# Patient Record
Sex: Female | Born: 1975 | Race: White | Hispanic: No | Smoking: Former smoker
Health system: Southern US, Community
[De-identification: ages and names within clinical notes are randomized; demographics above are authoritative.]

## PROBLEM LIST (undated history)

## (undated) DIAGNOSIS — E119 Type 2 diabetes mellitus without complications: Secondary | ICD-10-CM

## (undated) DIAGNOSIS — Z9889 Other specified postprocedural states: Secondary | ICD-10-CM

## (undated) DIAGNOSIS — Z8489 Family history of other specified conditions: Secondary | ICD-10-CM

## (undated) DIAGNOSIS — K219 Gastro-esophageal reflux disease without esophagitis: Secondary | ICD-10-CM

## (undated) DIAGNOSIS — G43909 Migraine, unspecified, not intractable, without status migrainosus: Secondary | ICD-10-CM

## (undated) DIAGNOSIS — F419 Anxiety disorder, unspecified: Secondary | ICD-10-CM

## (undated) DIAGNOSIS — IMO0002 Reserved for concepts with insufficient information to code with codable children: Secondary | ICD-10-CM

## (undated) DIAGNOSIS — A4101 Sepsis due to Methicillin susceptible Staphylococcus aureus: Secondary | ICD-10-CM

## (undated) DIAGNOSIS — I499 Cardiac arrhythmia, unspecified: Secondary | ICD-10-CM

## (undated) DIAGNOSIS — R112 Nausea with vomiting, unspecified: Secondary | ICD-10-CM

## (undated) HISTORY — PX: BREAST SURGERY: SHX581

## (undated) HISTORY — DX: Type 2 diabetes mellitus without complications: E11.9

## (undated) HISTORY — PX: ABDOMINAL HYSTERECTOMY: SHX81

## (undated) HISTORY — DX: Reserved for concepts with insufficient information to code with codable children: IMO0002

## (undated) HISTORY — PX: BACK SURGERY: SHX140

## (undated) HISTORY — DX: Sepsis due to methicillin susceptible Staphylococcus aureus: A41.01

## (undated) HISTORY — PX: CHOLECYSTECTOMY: SHX55

## (undated) HISTORY — DX: Migraine, unspecified, not intractable, without status migrainosus: G43.909

---

## 1997-11-21 ENCOUNTER — Ambulatory Visit (HOSPITAL_COMMUNITY): Admission: RE | Admit: 1997-11-21 | Discharge: 1997-11-21 | Payer: Self-pay | Admitting: Family Medicine

## 2000-10-08 ENCOUNTER — Encounter: Payer: Self-pay | Admitting: Neurosurgery

## 2000-10-12 ENCOUNTER — Ambulatory Visit (HOSPITAL_COMMUNITY): Admission: RE | Admit: 2000-10-12 | Discharge: 2000-10-13 | Payer: Self-pay | Admitting: Neurosurgery

## 2000-10-12 ENCOUNTER — Encounter: Payer: Self-pay | Admitting: Neurosurgery

## 2000-11-02 ENCOUNTER — Ambulatory Visit (HOSPITAL_COMMUNITY): Admission: RE | Admit: 2000-11-02 | Discharge: 2000-11-02 | Payer: Self-pay | Admitting: Neurosurgery

## 2000-11-02 ENCOUNTER — Encounter: Payer: Self-pay | Admitting: Neurosurgery

## 2000-11-18 ENCOUNTER — Encounter: Admission: RE | Admit: 2000-11-18 | Discharge: 2001-01-30 | Payer: Self-pay | Admitting: Anesthesiology

## 2000-11-30 ENCOUNTER — Encounter: Payer: Self-pay | Admitting: *Deleted

## 2000-11-30 ENCOUNTER — Ambulatory Visit (HOSPITAL_COMMUNITY): Admission: RE | Admit: 2000-11-30 | Discharge: 2000-11-30 | Payer: Self-pay | Admitting: *Deleted

## 2000-12-07 ENCOUNTER — Encounter (INDEPENDENT_AMBULATORY_CARE_PROVIDER_SITE_OTHER): Payer: Self-pay | Admitting: *Deleted

## 2000-12-07 ENCOUNTER — Observation Stay (HOSPITAL_COMMUNITY): Admission: RE | Admit: 2000-12-07 | Discharge: 2000-12-08 | Payer: Self-pay | Admitting: *Deleted

## 2001-01-05 ENCOUNTER — Encounter (INDEPENDENT_AMBULATORY_CARE_PROVIDER_SITE_OTHER): Payer: Self-pay | Admitting: Specialist

## 2001-01-05 ENCOUNTER — Ambulatory Visit (HOSPITAL_BASED_OUTPATIENT_CLINIC_OR_DEPARTMENT_OTHER): Admission: RE | Admit: 2001-01-05 | Discharge: 2001-01-06 | Payer: Self-pay | Admitting: Plastic Surgery

## 2001-01-15 ENCOUNTER — Encounter (HOSPITAL_COMMUNITY): Admission: RE | Admit: 2001-01-15 | Discharge: 2001-02-14 | Payer: Self-pay | Admitting: Neurosurgery

## 2001-02-18 ENCOUNTER — Encounter: Admission: RE | Admit: 2001-02-18 | Discharge: 2001-02-18 | Payer: Self-pay | Admitting: Neurosurgery

## 2001-02-18 ENCOUNTER — Encounter: Payer: Self-pay | Admitting: Neurosurgery

## 2001-02-22 ENCOUNTER — Encounter: Admission: RE | Admit: 2001-02-22 | Discharge: 2001-02-22 | Payer: Self-pay | Admitting: Neurosurgery

## 2001-02-22 ENCOUNTER — Encounter: Payer: Self-pay | Admitting: Neurosurgery

## 2002-05-03 ENCOUNTER — Ambulatory Visit (HOSPITAL_COMMUNITY): Admission: RE | Admit: 2002-05-03 | Discharge: 2002-05-03 | Payer: Self-pay | Admitting: Neurosurgery

## 2002-05-03 ENCOUNTER — Encounter: Payer: Self-pay | Admitting: Neurosurgery

## 2002-05-30 ENCOUNTER — Encounter: Admission: RE | Admit: 2002-05-30 | Discharge: 2002-05-30 | Payer: Self-pay | Admitting: Neurosurgery

## 2002-05-30 ENCOUNTER — Encounter: Payer: Self-pay | Admitting: Neurosurgery

## 2002-06-02 HISTORY — PX: REDUCTION MAMMAPLASTY: SUR839

## 2002-06-23 ENCOUNTER — Encounter: Payer: Self-pay | Admitting: Neurosurgery

## 2002-06-23 ENCOUNTER — Inpatient Hospital Stay (HOSPITAL_COMMUNITY): Admission: RE | Admit: 2002-06-23 | Discharge: 2002-06-25 | Payer: Self-pay | Admitting: Neurosurgery

## 2002-08-03 ENCOUNTER — Encounter: Payer: Self-pay | Admitting: Neurosurgery

## 2002-08-03 ENCOUNTER — Encounter: Admission: RE | Admit: 2002-08-03 | Discharge: 2002-08-03 | Payer: Self-pay | Admitting: Neurosurgery

## 2002-08-09 ENCOUNTER — Encounter (HOSPITAL_COMMUNITY): Admission: RE | Admit: 2002-08-09 | Discharge: 2002-09-08 | Payer: Self-pay | Admitting: Neurosurgery

## 2002-10-04 ENCOUNTER — Encounter: Admission: RE | Admit: 2002-10-04 | Discharge: 2002-10-04 | Payer: Self-pay | Admitting: Neurosurgery

## 2002-10-04 ENCOUNTER — Encounter: Payer: Self-pay | Admitting: Neurosurgery

## 2002-11-11 ENCOUNTER — Other Ambulatory Visit: Admission: RE | Admit: 2002-11-11 | Discharge: 2002-11-11 | Payer: Self-pay | Admitting: Obstetrics and Gynecology

## 2003-07-02 ENCOUNTER — Emergency Department (HOSPITAL_COMMUNITY): Admission: EM | Admit: 2003-07-02 | Discharge: 2003-07-02 | Payer: Self-pay | Admitting: Emergency Medicine

## 2004-02-26 ENCOUNTER — Inpatient Hospital Stay (HOSPITAL_COMMUNITY): Admission: RE | Admit: 2004-02-26 | Discharge: 2004-02-28 | Payer: Self-pay | Admitting: General Surgery

## 2009-01-29 ENCOUNTER — Emergency Department (HOSPITAL_COMMUNITY): Admission: EM | Admit: 2009-01-29 | Discharge: 2009-01-29 | Payer: Self-pay | Admitting: Emergency Medicine

## 2010-06-23 ENCOUNTER — Encounter: Payer: Self-pay | Admitting: Surgery

## 2010-08-23 ENCOUNTER — Emergency Department (HOSPITAL_COMMUNITY): Payer: Self-pay

## 2010-08-23 ENCOUNTER — Emergency Department (HOSPITAL_COMMUNITY)
Admission: EM | Admit: 2010-08-23 | Discharge: 2010-08-23 | Disposition: A | Discharge status: Home / Self Care | Payer: No Typology Code available for payment source | Attending: Emergency Medicine | Admitting: Emergency Medicine

## 2010-08-23 DIAGNOSIS — T148XXA Other injury of unspecified body region, initial encounter: Secondary | ICD-10-CM | POA: Insufficient documentation

## 2010-08-23 DIAGNOSIS — S20219A Contusion of unspecified front wall of thorax, initial encounter: Secondary | ICD-10-CM | POA: Insufficient documentation

## 2010-08-23 DIAGNOSIS — R071 Chest pain on breathing: Secondary | ICD-10-CM | POA: Insufficient documentation

## 2010-10-18 NOTE — H&P (Signed)
NAME:  Cheryl Randall, Cheryl Randall               ACCOUNT NO.:  [id number]   MEDICAL RECORD NO.:  [id number]          PATIENT TYPE:  AMB   LOCATION:  DAY                           FACILITY:  [facility]   PHYSICIAN:  Tilda Burrow, M.D. DATE OF BIRTH:  06-14-1975   DATE OF ADMISSION:  02/26/2004  DATE OF DISCHARGE:  LH                                HISTORY & PHYSICAL   ADMITTING DIAGNOSES:  1.  Dysmenorrhea.  2.  Dyspareunia.  3.  Heavy and prolonged periods.   HISTORY OF PRESENT ILLNESS:  This 36 year old female was admitted at this  time for abdominal hysterectomy and panniculectomy.  She has been seen in  our office since August with evaluation including review of records.  Prior  GYN care was through Dr. Tresa Res of [place].  Records have been reviewed.  The patient complains of severe dysmenorrhea first two days of period,  bedridden.  She bleeds so heavy, she stays home and misses work.  This has  been occurring for four to five years.  She wants no more children.  She has  had discussion of the pros and cons of hysterectomy by Dr. Tresa Res as well as  myself.  We have discussed vaginal hysterectomy versus abdominal  hysterectomy.  The patient is apprehensive regarding vaginal approach due to  the concern that it might need to be converted. She has had a significantly  difficult back pain and has had rods placed in her back.  She does not want  to run the risk of worsening her back pain with manipulation of positioning  of her legs while she is asleep.   Vaginal probe ultrasound has been performed showing uterine size to be 9.9  cm in length with 4.1 cm AP depth by 5.8 cm transverse size with a thin,  endometrial stripe.  She has an extremely-long cervix which makes 5 cm of  the uterine length, and takes up the upper one-third of the vagina.  This  causes significant discomfort when contacted with the vaginal probe and  reproduces dyspareunia.  After discussion of the anatomic considerations,  we  have agreed on abdominal hysterectomy.  Additionally, the patient has  frustrations with abdominal wall girth.  She has not been successful at  weight loss.  Plans are to do a partial panniculectomy as a part of the  procedure. We have discussed that the risk of surgery is increased with the  increased incision size, specifically risk for infection or fluid pocket  opening, (seroma).   PAST MEDICAL HISTORY:  1.  Benign surgical history.  2.  Back surgery x2.  3.  Breast reduction surgery from four pounds each side in [facility].   INJURIES:  None.   ALLERGIES:  None.   MEDICATIONS:  None.   The back surgery consists of rod and four screws.  Nonetheless, she  complains of back pain at her menses.  The back surgery did help her leg  pain.   PHYSICAL EXAMINATION:  GENERAL:  Reveals a healthy, well-informed,  articulate, Caucasian female, alert and oriented x3.  VITAL SIGNS:  Weight 221  pounds, blood pressure 112/66.  Pulse 70.  HEENT:  Pupils equal, round, reactive.  Extraocular movements intact.  NECK:  Supple.  CARDIOVASCULAR EXAM:  Unremarkable.  ABDOMEN:  Moderate obesity.  No abdominal scars.  EXTERNAL GENITALIA:  Multiparous, good vaginal length.  Cervix bulbous  filling the upper one-third of the vagina.  The uterus measurements noted in  HPI.  Adnexa is without masses at this time on ultrasound and bimanual.  Uterine contact reproduces dyspareunia.   PLAN:  Abdominal hysterectomy, removal of cervix, preservation of ovaries  through long Pfannenstiel incision on February 26, 2004, at 9 a.m.     [name]   JVF/MEDQ  D:  02/25/2004  T:  02/26/2004  Job:  [id number]   cc:   [facility]   [name]  Fax: [phone]

## 2010-10-18 NOTE — Op Note (Signed)
[place]. [facility]  Patient:    Cheryl Randall, Cheryl Randall                        MRN: [id number] Proc. Date: 01/05/01 Adm. Date:  [id number] Attending:  Eloise Levels                           Operative Report  PREOPERATIVE DIAGNOSIS:  Bilateral macromastia.  POSTOPERATIVE DIAGNOSIS:  Bilateral macromastia.  PROCEDURE:  Bilateral reduction mammoplasty.  SURGEON:  Mary A. Contogiannis, M.D.  ASSISTANT:  Stanford Scotland, C.F.A.  ANESTHESIA:  General endotracheal anesthesia.  ANESTHESIOLOGIST:  Cliffton Asters. Crews, M.D.  ESTIMATED BLOOD LOSS:  150 cc.  FLUID REPLACEMENT:  1500 cc. crystalloid.  URINE OUTPUT:  250 cc.  COMPLICATIONS:  None.  AMOUNT OF BREAST TISSUE REMOVED:  Right breast 1040 grams, left breast 872 grams.  INDICATION FOR OVERNIGHT STAY:  Progressive pain control along with ambulation and monitoring of the nipples and breast flaps.  INDICATIONS FOR THE PROCEDURE:  The patient is a 35 year old Caucasian female with bilateral macromastia, neck aches, back aches, intertriginous skin rashes, and shoulder strap groove marks and pain who presents for bilateral reduction mammoplasties.  PROCEDURE:  The patient was brought to the preoperative holding area for the future reduction mammoplasties in the pattern of [place].  She was then taken back to the operating room and placed on the operating table in the supine position.  After adequate general endotracheal anesthesia was obtained, the patients chest was prepped with Betadine and alcohol and draped in a sterile fashion.  The base of each breasts were then injected with 1% lidocaine with epinephrine.  After adequate hemostasis had taken effect, the procedure was begun.  First the right breast reduction was performed.  The nipple was marked with a 42 mm nipple marker and incised.  The skin was then de epithelialized around the breast down to the inframammary crease in an inferior pedicle  pattern. The medial, superior and lateral skin flaps were then elevated down to the chest wall and slightly off the chest wall for shaping.  The excess fat and glandular tissue was then removed from the right breast.  There was a lot of fibrocystic changes present.  The nipple was then examined and found to be pink and viable.  The wound was irrigated with saline irrigation.  The inferior pedicle was centralized using 3-0 chromic sutures.  A number #10 JP flat fully fluted drain was then placed into the wound and then skin flaps brought together in a inverted T junction with a 2-0 Prolene suture.  Incision was stapled for temporary closure.  Attention was then turned to the left breast.  In a similar manner the nipple was marked with a 42 mm nipple marker.  This was then incised, then the skin de epithelialized down to the inframammary crease in an inferior pedicle pattern.  Next, the medial, superior, and lateral skin flaps were elevated down to the chest wall and then slightly off the chest wall for better shaping of the breast.  The excess fat and glandular tissue removed from the inferior pedicle. The nipple was then examined and found to be pink and viable.  The wound was then irrigated with warm saline irrigation.  Meticulous hemostasis was obtained with a Bovie electrocautery.  The inferior pedicle was then centralized using 3-0 Prolene suture.  A #10 JP flap fully fluted drain was then  placed into the wound.  The skin flaps brought together at the inverted T junction with a 2-0 Prolene suture.   The incisions were then stapled for temporary closure.  The breasts were then compared and found to be of good and symmetry and shape.  The incisions were then closed on both breasts using 3-0 monacryl in the dermal layer, and a 4-0 monacryl running intercuticular stitch on the skin.  The patient was then placed in upright position.  The future location of the nipples was marked on each  breast with a 38 mm nipple marker.  She was then placed in recumbent position.  First the skin for the future right nipple areolar complex was incised and excised full thickness using the Bovie electrocautery.  The nipple was then brought out into the aperture and sewn in place using 4-0 monacryl interrupted dermal sutures followed by 5-0 monacryl running intercuticular stitch on the skin.  In a likewise fashion the area for the future nipple areolar complex was incised on the left breast.  This tissue was then excised full thickness with Bovie electrocautery.  The nipple was then brought out into this aperture and sewn in place using 4-0 monacryl interrupted dermal sutures, followed by 5-0 monacryl running intercuticular stitch on the skin.  The drains were sewn in place with 3-0 nylon suture.  The incisions were dressed with benzoin and Steri-Strips.  The nipples were also dressed with bacitracin ointment and Adaptic.  Then 4 x 4 gauze was placed over all these incisions and the nipples and she was placed in a light postoperative support bra.  There were no complications.  The patient tolerated the procedure well.  The final needle and sponge counts were reported to be correct at the end of the case.  Total amount of tissues removed were 1,040 grams right breast and 872 grams left breast.  The patient was then taken to the recovery room in stable condition. She will then remain in the RCC overnight for progressive pain control, ambulation, and monitoring of the breast flaps and nipples. DD:  01/05/01 TD:  01/05/01 Job: [id number]

## 2010-10-18 NOTE — Op Note (Signed)
[place]. [facility]  Patient:    Cheryl Randall, Cheryl Randall                        MRN: [id number] Proc. Date: 12/07/00 Adm. Date:  [id number] Attending:  Kandis Mannan CC:         Colon Flattery, D.O., [place], [facility]  Reinaldo Meeker, M.D.   Operative Report  CCS NUMBER: [id number]  PREOPERATIVE DIAGNOSIS:  Chronic cholecystitis with cholelithiasis.  POSTOPERATIVE DIAGNOSIS:  Chronic cholecystitis with cholelithiasis.  OPERATION: Laparoscopic cholecystectomy.  SURGEON:  Maisie Fus B. Samuella Cota, M.D.  ASSISTANT:   Abigail Miyamoto, M.D.  ANESTHESIA:  General.  ANESTHESIOLOGIST:  Lestine Box, M.D. and CRNA  DESCRIPTION OF PROCEDURE:  The patient was taken to the operating room and placed on the table in supine position.  After satisfactory general anesthetic with intubation, the entire abdomen was prepped and draped in sterile field. A small vertical infraumbilical incision was made through the skin, subcutaneous tissue, and the midline fascia was divided and the peritoneal cavity entered.  A finger was placed in the peritoneal cavity, and there were no adhesions to the anterior abdominal wall.  A pursestring suture of 0 Vicryl was placed, and the Hasson trocar placed in the abdomen and the abdomen insufflated to 14 mmHg pressure.   A second 10 mm trocar was place just to the right of midline in the subxiphoid area.  Two 5 mm trocars were placed laterally.   Visual examination of the abdomen revealed no abnormalities obvious in the pelvis or in the upper abdomen.  The gallbladder had no adhesions to it.  The gallbladder was placed on traction, and the cystic duct was easily identified.  The cystic duct was quite small with the cystic artery being cephalad to that.  The junction of the cystic duct to the gallbladder was easily seen.  Because the patient was known to have a single large gallstone and normal liver function tests, and then with the finding  of a small cystic duct, it was elected not to do an operative cholangiogram.  The cystic duct was triply clipped on the remaining side, once on the gallbladder side, and divided.  The cystic artery was treated in a similar manner. The gallbladder was dissected from the bed using the cautery.  There was another small vessel noted, and this was doubly clipped and divided.  Hemostasis was obtained.  The gallbladder bed was irrigated, and there was no evidence of a bile leak or bleeding.  The gallbladder was freed from the gallbladder bed, and the gallbladder then easily delivered through the infraumbilical incision. There was a single stone about the size of a 1 cm marble.  The pursestring suture in the infraumbilical incision was tied, and this gave good closure to the fascia.   Marcaine 0.25% without epinephrine was injected into the fascia at the infraumbilical incision; 0.25% Marcaine without epinephrine had previously been injected at the other trocar sites.  The fluid in the right upper quadrant was suctioned, and the two lateral trocars were removed under direct vision.  There was no bleeding.  The abdomen was deflated, and the second 10 mm trocar was removed.  The two incisions in the midline were closed with running subcuticular sutures of 4-0 Vicryl, and the two lateral trocar sites were closed with single simple inverted 4-0 Vicryl.   Benzoin and 1/4-inch Steri-Strips were used to reinforce the skin closures.  Dry sterile dressings were applied.  The patient seemed to tolerate the procedure well and was taken to the PACU in satisfactory condition. DD:  12/07/00 TD:  12/07/00 Job: [id number]

## 2010-10-18 NOTE — Op Note (Signed)
[place]. [facility]  Patient:    Cheryl Randall, Cheryl Randall                        MRN: [id number] Proc. Date: 10/12/00 Adm. Date:  [id number] Attending:  Gerald Dexter                           Operative Report  PREOPERATIVE DIAGNOSIS:  Herniated disk, L4-5 right.  POSTOPERATIVE DIAGNOSIS:  Herniated disk, L4-5 right.  PROCEDURES: 1. Right L5 intralaminar laminotomy for excision of herniated disk with    operating microscope. 2. Microdissection L4-5 disk and L5 nerve root.  SURGEON:  Reinaldo Meeker, M.D.  ASSISTANT:  Julio Sicks, M.D.  DESCRIPTION OF PROCEDURE:  After being placed in the prone position, the patients back was prepped and draped in the usual sterile fashion.  A localizing x-ray was taken prior to incision to identify the L4-5 level.  A midline incision was made over the spinous processes of L4 and L5.  Using Bovie cutting current, the incision was carried down to the spinous processes. Subperiosteal dissection was then carried out on the right side of the spinous processes and laminae, and the McCullough self-retaining retractor was placed for exposure.  A second x-ray was taken to confirm approach to the L4-5 level, and this was correct.  Using the high-speed drill, the inferior one-third of the L4 lamina and the medial one-third of the facet joint were removed.  The drill was then used to remove the superior one-third of the L5 lamina. Residual bone and ligamentum flavum were removed in a piecemeal fashion.  The microscope was draped, brought into the field, and used for the remainder of the case.  Using microdissection technique, the lateral aspect of the thecal sac and L5 nerve root were identified.  Further coagulation was carried down to the floor of the canal to identify the L4-5 disk, which was found to be markedly herniated.  After coagulating down the annulus, the annulus was incised with a 15 blade.  Using pituitary rongeurs and  curettes, very thorough disk space cleanout was carried out.  At the same time, great care was taken to avoid injury to the neural elements, and this was successfully done.  At this point, inspection was carried out in all directions for any evidence of residual compression, and none could be identified.  Large amounts of irrigation were carried out and any bleeding controlled with bipolar coagulation and Gelfoam.  The wound was then closed using interrupted Vicryl on the muscle, fascia, subcutaneous and subcuticular tissues, and Dermabond glue and Steri-Strips on the skin.  A sterile dressing was then applied.  The patient was extubated and taken to the recovery room in stable condition. DD:  10/12/00 TD:  10/12/00 Job: [id number]

## 2010-10-18 NOTE — H&P (Signed)
[facility]  Patient:    Cheryl Randall, Cheryl Randall                        MRN: [id number] Adm. Date:  [id number] Attending:  Thyra Breed CC:         Reinaldo Meeker, M.D.   History and Physical  FOLLOWUP EVALUATION  HISTORY OF PRESENT ILLNESS:  Brealyn comes in for follow-up evaluation of her chronic low back pain on the basis of a lumbar radiculopathy.  Since her last evaluation, the patient has noted a 50% reduction in her pain.  She has been started on Elavil by Dr. Gerlene Fee last week and started into physical therapy. She has recently had gallbladder taken out and plans to have a breast reduction.  I advised her that before proceeding with any chronic pain management, she needs to proceed through Dr. Despina Arias planned route and should have the breast reduction surgery to see if this may be contributing to her lower back discomfort.  She rates her back pain as 6/10.  Predominantly radiates out into the right lower extremity and is made worse by walking and improved by lying down.  She is currently on Paxil and amitriptyline.  PHYSICAL EXAMINATION:  VITAL SIGNS:  Blood pressure 129/68, heart rate 108, respiratory rate 20, and O2 saturation 98%.  Pain level 6/10.  NEUROLOGIC:  Straight leg raise signs are negative.  Deep tendon reflexes are symmetric.  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  DISPOSITION: 1. Continue with physical therapy and amitriptyline per Dr. Gerlene Fee. 2. The patient is seeing Dr. Corrie Dandy A. Contogiannis with regard to possible    breast reduction, and I encouraged her to follow up with this.  I advised    her that I would see her if needed in four weeks to discuss chronic pain    management if she is not improved. DD:  12/25/00 TD:  12/25/00 Job: [id number]

## 2010-10-18 NOTE — Procedures (Signed)
[facility]  Patient:    Cheryl Randall, Cheryl Randall                        MRN: [id number] Proc. Date: 11/26/00 Adm. Date:  [id number] Attending:  Thyra Breed CC:         Reinaldo Meeker, M.D.   Procedure Report  PROCEDURE:  Lumbar epidural steroid injection.  DIAGNOSIS:  Lumbar radiculopathy.  INTERVAL HISTORY:  The patient has noted minimal improvement after the first injection.  She is complaining of pain now radiating to the left and right lower extremities.  She feels as though she has an ache in her hip region like she is getting ready to have a spasm.  She contacted Dr. Despina Arias office.  She has been switched from the Tylox to Vicodin, which she takes just in the evenings.  She is very worried about the fact that she has not responded to the first injection and is asking about what options she has, and I advised her that if she does not respond to this injection, that we would have to discuss medical management of her chronic pain as well as potentially having her seen by our psychologist.  She has reluctance to go on long-acting opiates because of the need to care for her child, and she is very worried about her work.  I advised her that Dr. Gerlene Fee would like to see her back in follow-up after this injection and that I would go ahead and schedule her back in four weeks, and we could discuss chronic pain management if she is not improved.  If she is improved, I will give her another injection.  PHYSICAL EXAMINATION:  VITAL SIGNS:  Blood pressure 135/62, heart rate 86, respiratory rate 18, O2 saturation 100%, pain level is 8 out of 10, and temperature is 98 degrees.  MUSCULOSKELETAL/NEUROLOGIC:  Her back shows good healing from previous injection site.  She is very tender to touch over her incisional scar and paraspinous muscles of the back.  Straight leg raise signs are negative, and her deep tendon reflexes are symmetric in the lower  extremities.  DESCRIPTION OF PROCEDURE:  After informed consent was obtained, the patient was placed in a sitting position and monitored.  Her back was prepped with Betadine x 3.  A skin wheal was raised at the L3-4 interspace with 1% lidocaine.  A 20-gauge Tuohy needle was introduced to the lumbar epidural space and loss of resistance to preservative-free normal saline.  There was no CSF nor blood.  The depth was 8 cm.  I injected Medrol 80 mg in 8 ml of preservative-free normal saline.  The needle was flushed and removed intact.  POSTPROCEDURE CONDITION:  Stable.  DISCHARGE INSTRUCTIONS: 1. Resume previous diet. 2. Limitation on activities per instruction sheet. 3. Continue on current medications. 4. Follow up with me in four weeks to discuss pain management if she is not    improved, or sooner if she does respond to this epidural. DD:  11/26/00 TD:  11/26/00 Job: [id number]

## 2010-10-18 NOTE — Op Note (Signed)
NAME:  Cheryl Randall, Cheryl Randall                           ACCOUNT NO.:  [id number]   MEDICAL RECORD NO.:  [id number]                   PATIENT TYPE:  INP   LOCATION:  3172                                 FACILITY:  [facility]   PHYSICIAN:  Reinaldo Meeker, M.D.              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   POSTOPERATIVE DIAGNOSIS:  Herniated disk at L4-5 right, recurrent.   PROCEDURES:  1. Bilateral  L4-5 decompressive laminectomy, followed by bilateral     microdiskectomy, followed by posterior lumbar interbody fusion with bone     spacers and autologous bone graft, followed by trans-facet screws.  2. Microdissection of L4-5 disk and L5 nerve roots bilaterally.  3. Intraoperative electromyographic monitoring.   SURGEON:  Reinaldo Meeker, M.D.   ASSISTANT:  Donalee Citrin, M.D.   DESCRIPTION OF PROCEDURE:  After being placed in the prone position, the  patient's back was prepped and draped in the usual sterile fashion.  Localizing fluoroscopy was used prior to incision to identify the  appropriate level.  A midline incision was made above the spinous processes  of L3, L4, and L5.  Using Bovie cutting current, the incision was carried  down to the spinous processes.  Subperiosteal dissection was then carried  out on the spinous processes and laminae and facet joints bilaterally and a  self-retaining retractor was placed for exposure.  A second fluoroscopy  showed approach to the appropriate level.  Using a Stille rongeur, the  spinous process and interspinous ligament were removed.  Starting on the  patient's left side, a high-speed drill was used to perform a laminotomy by  removing the inferior one-half of the L4 lamina and the medial one-third of  the facet joint, and the superior one-half of the L5 lamina.  Residual bone  and  ligamentum flavum were removed in a piecemeal fashion and the bone was  saved for use at the end of the case.  A similar decompression was carried  out on the patient's right side, once again identifying the edges of the  previous laminotomy and dissecting down.  Residual bone in the midline was  removed as well as scar tissue and ligamentum flavum to complete the  decompressive laminectomy bilaterally.  At this point the microscope was  brought in the field.  Starting on the patient's right side, scar tissue was  dissected away from the nerve root and nerve root mobilization was  progressively obtained.  The disk was then entered and herniated material  removed.  Thorough disk space clean-out was carried out.  At the same time  great care was taken to avoid injury to the neural elements, and this was  successfully done.  A similar diskectomy was then carried out on  the  patient's left side, once again identifying the lateral nerve root, incising  the annulus, and cleaning out the disk vigorously.  At this time inspection  was carried out in all directions for any evidence of residual compression  from disk herniation, and none could be identified.  Posterior lumbar  interbody fusion was then performed.  Under fluoroscopic guidance starting  on the patient's left side, a scraping of the end plate was carried out,  followed by chiseling with a 12 x 9 box cutter.  A 12 x 9 x 20 mm bone  spacer was then placed in the disk space and confirmed in a good position by  fluoroscopic guidance.  A similar procedure was then carried out on the  patient's right side.  Great care was taken to avoid injury to the neural  elements, and this was successfully done.  Scraping, followed by cutting and  placement of the bone was once again carried out.  Prior to placement of the  second bony spacer, autologous bone graft was placed in the midline to help  with the interbody fusion.  At this point trans-facet  screws were placed.  Starting on the patient's left side, a drill hole was placed, followed by  passing a drill over a guidewire.  Intact pedicle was confirmed with  intraoperative EMG monitoring, direct visualization, and AP and lateral  fluoroscopy.  There was some question of a slightly low reading on the EMG,  but AP and lateral fluoroscopy showed the drill and screw to be in good  position, and there was no evidence of breech on palpation of the pedicle.  A 35 mm screw was placed in the appropriate position.  Then on the patient's  right side a similar procedure was carried out.  Once again good screw  placement was confirmed by EMG monitoring, direct visualization, and AP and  lateral fluoroscopy.  Final fluoroscopy in the AP and lateral direction  showed the screw and plug to all be in good position.  Large amounts of  irrigation were carried out.  Any bleeding was controlled with bipolar  coagulation and Gelfoam.  The wound was then closed using interrupted Vicryl  on the muscle, fascia, subcutaneous and subcuticular tissues and staples on  the skin.  A sterile dressing  was then applied.  The patient was extubated  and taken to the recovery room in stable condition.  Prior closing, a  Hemovac drain was left in the epidural space and secured through a separate  stab wound incision to the skin with an interrupted Vicryl stitch.                                               Reinaldo Meeker, M.D.    ROK/MEDQ  D:  06/23/2002  T:  06/23/2002  Job:  [id number]

## 2010-10-18 NOTE — Procedures (Signed)
[facility]  Patient:    Cheryl Randall, Cheryl Randall                        MRN: [id number] Proc. Date: 11/18/00 Adm. Date:  [id number] Disc. Date: [id number] Attending:  Gerald Dexter CC:         Reinaldo Meeker, M.D.   Procedure Report  PROCEDURE:  Lumbar epidural steroid injection.  DIAGNOSIS: Lumbar radiculopathy status post microdiskectomy.  HISTORY OF PRESENT ILLNESS:  Cheryl Randall is a very pleasant 35 year old who was sent to [name] by Dr. Aliene Beams for two lumbar epidural steroid injections. The patient stated that she was in her usual state of health up until December when she had the spontaneous onset of lower back discomfort. At that time, it radiated out into the right lower extremity and was characterized as a dull achy discomfort. She was initially seen by Dr. Hilda Lias who sent her to a chiropractor. She was treated conservatively with Flexeril, vioxx, Darvocet, diazepam and did not improve. A MRI of her back demonstrated a herniated disk and she was sent to Dr. Dorene Sorrow who did not feel like epidurals would be of any benefit and was seen by Dr. Gerlene Fee who initially saw her in late April. She has been through extensive conservative management without improvement and it was felt that the best option at that time was to undergo a right L5 intralaminar laminotomy and microdissection of the L4-5 disk and L5 nerve root. She did well with this procedure but had persistent pain radiating out into the right lower extremity which she described as an intermittently sharp persistent achy discomfort. It is made worse by laying down or bending over and improved by her medication in the form of OxyCodone. She has intermittent numbness and tingling into the right calf region but denied any true weakness or bowel or bladder incontinence.  She is currently on Percocet and Paxil with p.r.n. albuterol.  ALLERGIES:  No known drug allergies.  FAMILY HISTORY:   Positive for melanoma and diabetes.  ACTIVE MEDICAL PROBLEMS:  History of asthma which has been inactive recently, history of panic disorder currently on Paxil and asymptomatic renal cyst.  PAST SURGICAL HISTORY:  Significant for back surgery as mentioned above.  SOCIAL HISTORY:  The patients a nonsmoker, nondrinker. 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 She has not been able to work since April.  REVIEW OF SYSTEMS:  GENERAL:  Negative.  HEAD:  Significant for intermittent frontal headaches which she attributes to stress. EYES:  Significant for corrective lenses. NOSE/MOUTH/THROAT:  Negative. EARS:  Negative. LUNGS: History of asthma, none recently. CARDIOVASCULAR:  Negative. GI: Negative. GU: History of urinary frequency. MUSCULOSKELETAL/NEUROLOGIC:  See HPI. No history of seizure or stroke. HEMATOLOGIC:  Negative. ENDOCRINE: Negative. CUTANEOUS:  Negative. PSYCHIATRIC:  Positive for panic attacks. ALLERGY/IMMUNOLOGIC:  Negative.  PHYSICAL EXAMINATION:  VITAL SIGNS:  Blood pressure is 127/79, heart rate 69, respiratory rate 22, O2 saturations 99%. Pain level was 8/10.  HEENT:  Head was normocephalic, atraumatic. Eyes, extraocular movements intact with conjunctivae and sclerae clear. Nose patent nares without discharge. Oropharynx was free of lesions.  NECK:  Supple without lymphadenopathy.  LUNGS:  Clear to auscultation and percussion.  HEART:  Regular rate and rhythm.  BREASTS/ABDOMINAL/PELVIC/RECTAL:  Not performed.  BACK:  Revealed well healed surgical scar. Straight leg raise signs were negative.  EXTREMITIES:  No cyanosis, clubbing or edema. Radial pulses and dorsalis pedis  pulse 2+ and symmetric.  NEUROLOGIC:  The patient was oriented x 4. Cranial nerves II-XII are grossly intact. Deep tendon reflexes were symmetric in the upper and lower extremity with downgoing toes. Motor was 5/5 with symmetric bulk and tone. Sensation was significant  for attenuated pinprick over the right lower extremity over the calf area to the dorsum of the foot.  A MRI from June 3 revealed at L4-5 a shallow disk protrusion with some enhancement in the posterior spinal soft tissues and right anterior epidural space surrounding the right L5 nerve root. At L5-S1, there was some central annular disk bulge.  IMPRESSION: 1. Persistent lumbar radiculopathy with history of herniated disk at L4-5. 2. Other medical problems per Dr. Dewaine Conger which include panic disorder,    asthma, and asymptomatic renal cyst.  DISPOSITION:  I discussed the potential risks, benefits and limitations of a lumbar epidural steroid injection in detail with the patient as well as with her husband. Their questions were answered. We reviewed the side effects of corticosteroids. The patient is interested in proceeding with this.  DESCRIPTION OF PROCEDURE:  After informed consent was obtained, the patient was placed in the sitting position and monitored. The patients back was prepped with Betadine x 3. A skin wheal was raised at the L3-4 interspace with 1 percent lidocaine. A 20 gauge Tuohy needle was introduced to the lumbar epidural space to loss of resistance to preservative free normal saline. The depth was 8 cm. In the process of placing the needle, at one point, there was drainage of serous fluid which drained completely before receding.  There was no cerebrospinal fluid nor blood. The depth of the serous fluid was approximately 4 cm. The needle was flushed with preservative free normal saline before pursuing into the epidural space at 4 cm. After placing it into the epidural space at 8 cm, I injected 80 mg of Medrol and 8 ml of preservative free normal saline. The needle was flushed with preservative free normal saline and removed intact.  CONDITION POST PROCEDURE:  Stable.  DISCHARGE INSTRUCTIONS:  Resume previous diet. Limitations in activities per instruction sheet.  Continue on current medications. Follow-up with me in 1-2  weeks for repeat injection. DD:  11/18/00 TD:  11/18/00 Job: [id number]

## 2010-10-18 NOTE — Discharge Summary (Signed)
NAME:  Cheryl Randall, Cheryl Randall               ACCOUNT NO.:  [id number]   MEDICAL RECORD NO.:  [id number]          PATIENT TYPE:  INP   LOCATION:  A401                          FACILITY:  [facility]   PHYSICIAN:  Tilda Burrow, M.D. DATE OF BIRTH:  Feb 13, 1976   DATE OF ADMISSION:  02/26/2004  DATE OF DISCHARGE:  09/28/2005LH                                 DISCHARGE SUMMARY   ADMISSION DIAGNOSES:  Dyspareunia with heavy and prolonged periods.   DISCHARGE DIAGNOSES:  1.  Dyspareunia with heavy and prolonged periods.  2.  Obesity with panniculus.   PROCEDURE:  Total abdominal hysterectomy and panniculectomy.   DISCHARGE MEDICATIONS:  Tylox 1 to 2 q. 4 hours p.r.n. pain, Levaquin 500 mg  p.o. q.d. x5 days.   HISTORY OF PRESENT ILLNESS:  This 35 year old female was admitted for  abdominal hysterectomy and panniculectomy. She complained of dysmenorrhea,  missing 2 days of work per week. See HPI for details. The patient requested  panniculectomy as a portion of the procedure, with the patient understanding  that this is a non-covered service.   HOSPITAL COURSE:  The patient underwent abdominal hysterectomy with removal  of the cervix and preservation of ovaries through an exaggerated  Pfannenstiel incision, removing over 1,000 gram of panniculus with  underlying fatty tissue. Uterus was upper limits of normal size. Ovaries  were normal. Appendix was anterior, normal in appearance. There was very  thick abdominal fat noted. Estimated blood loss was 350 cc at surgery.  Postoperatively, the patient did well. She had a hematocrit of 11and  hemoglobin of 11 compared to 11 and 33 preoperatively. White count was  13,100. She had a subcutaneous Jackson-Pratt drain in the panniculectomy  site. On the second postoperative day, February 28, 2004, she was afebrile  and tolerating a regular diet with the passage of gas and no bowel movement  noted (preoperative bowel prep had been performed).   DISPOSITION:   She was stable for discharge. For continued outpatient  management.   FOLLOW UP:  In 1 week for staple removal and earlier for fever,  complications, discomfort, etc.     Cheryl Randall   JVF/MEDQ  D:  04/07/2004  T:  04/08/2004  Job:  [id number]

## 2010-10-18 NOTE — H&P (Signed)
[facility]  Patient:    Cheryl Randall, Cheryl Randall Visit Number: [id number] MRN: [id number]          Service Type: PMG Location: [facility] Attending Physician:  Thyra Breed Dictated by:   Thyra Breed, M.D. Adm. Date:  11/18/2000   CC:         Reinaldo Meeker, M.D.   History and Physical  FOLLOWUP EVALUATION  HISTORY OF PRESENT ILLNESS:  Cheryl Randall comes in for follow-up evaluation of her chronic lumbar radiculopathy to the right lower extremity status post microdiskectomy.  Since her last evaluation, the patient has undergone breast reduction by [facility] A. Contogiannis, M.D. and has done very well with regard to her surgical intervention.  She has a very positive overall sense of well-being following the surgery.  She notes that her right lower extremity discomfort continues to come on her despite the fact she has been put back in physical therapy.  She notes that this has significantly help to reduce some of her discomfort, but she continues to get pain out into the lateral aspect of her right calf which is brought on by activity.  CURRENT MEDICATIONS:  Paxil.  PHYSICAL EXAMINATION:  VITAL SIGNS:  Blood pressure 131/65, heart rate 92, respiratory rate 16, and O2 saturation 96%.  Pain level 8/10.  NEUROLOGIC:  Straight leg raise signs are negative.  Deep tendon reflexes were symmetric at knees and ankles.  IMPRESSION: 1. Lumbar radiculopathy into the right lower extremity, improved after breast    reduction to a degree. 2. Chronic low back pain syndrome with lumbar radiculopathy in L5 distribution    in the right lower extremity.  DISPOSITION: 1. Continue on current medications. 2. Continue with physiotherapy. 3. Followup with me in four weeks, at which time we will consider institution    of either anticonvulsants in the form of Neurontin versus tricyclic    antidepressants, which she is reluctant to go on because of previous    problems with weight gain  versus chronic opiates on a time contingent    basis. Dictated by:   Thyra Breed, M.D. Attending Physician:  Thyra Breed DD:  01/22/01 TD:  01/24/01 Job: [id number]

## 2010-10-18 NOTE — Op Note (Signed)
NAME:  Cheryl Randall, Cheryl Randall               ACCOUNT NO.:  [id number]   MEDICAL RECORD NO.:  [id number]          PATIENT TYPE:  INP   LOCATION:  A401                          FACILITY:  [facility]   PHYSICIAN:  Tilda Burrow, M.D. DATE OF BIRTH:  Dec 13, 1975   DATE OF PROCEDURE:  02/26/2004  DATE OF DISCHARGE:                                 OPERATIVE REPORT   PREOPERATIVE DIAGNOSES:  1.  Dysmenorrhea, dyspareunia, heavy menses.  2.  Obesity with panniculus.   POSTOPERATIVE DIAGNOSES:  1.  Dysmenorrhea, dyspareunia, heavy menses.  2.  Obesity with panniculus.   OPERATION/PROCEDURE:  1.  Total abdominal hysterectomy.  2.  Panniculectomy.   SURGEON:  Tilda Burrow, M.D.   ASSISTANTGeralynn Ochs, C.S.T.-F.A.   ANESTHESIA:  General.   COMPLICATIONS:  None.   ESTIMATED BLOOD LOSS:  350 mL.   FLUIDS REPLACED:  1750 mL crystalloid.   OUTPUT:  250 mL.   FINDINGS:  1.  Thick panniculectomy.  2.  Large cervix.  3.  Upper limits normal size uterus with normal ovaries and tubes.  4.  Anterior appendix.  5.  Thick abdominal fat and plenty of intra-abdominal fat.   DESCRIPTION OF PROCEDURE:  The patient was taken to the operating room and  Foley utilized.  Abdomen prepped and draped.  The patient abdomen had been  marked in the preoperative area as per our prior discussions regarding  panniculectomy.   A 70 cm long ellipse of skin was removed, approximately 10-12 cm in maximum  transverse diameter, removing the ellipse to a depth of about two-thirds of  the way to the fascia.  Point cautery was used as necessary.  Only two  arteries required ligation.  This ellipse of skin was removed and passed off  the table.  The lateral one-third of the incision on each side was pulled  together using 2-0 plain sutures and then the skin stapled shut.  The middle  third was left open and the peritoneal cavity then entered using Pelosi  technique, a midline incision from symphysis pubis halfway to the  umbilicus.   Peritoneal cavity was opened bluntly and no suspicion of trauma associated  with the entry.  Balfour retractor was positioned and abdominal contents  elevated with moistened lap tapes x3.  The bladder flap was then placed  inferiorly.  The uterus could be grasped with multiple tooth Lahey thyroid  tenaculum and elevated.  Round ligaments were taken down on each side by 0  chromic suture ligature and then Bovie cautery.  The utero-ovarian ligament  and fallopian tube pedicle were doubly clamped, cut and 0 chromic suture  ligament applied.  The bladder flap was developed anteriorly.  There was  lots of vasculature in this area.  We took down the uterine vessels on each  side with curved Heaney clamps, Kelly clamps to control backbleeding,  transection and 0 chromic suture ligature.  The cardinal ligaments were then  taken down with straight Heaney clampings, Mayo scissors transection and 0  chromic ligature.  We proceeded down to the level of the cervix.  The cervix  was quite bulbous and filled the upper portion of the vagina. A  stab  incision was made in the anterior cervical vaginal fornix and the cervix  amputated off the vaginal cuff.  Aldridge stitches were placed at each  lateral vaginal angle to maximize support of the cuff and control bleeding  in this area.  We then closed the cuff with continuous running 0 chromic.  There was some oozing from the area between the bladder and the cuff.  This  required two interrupted sutures of 2-0 plain placed transversely in such a  way to avoid the bladder, to achieve adequate hemostasis.  The bladder flap  was reapproximated loosely over the cuff with two interrupted sutures of 2-0  plain.  The pelvis was irrigated.  We then proceeded with removal of  laparotomy equipment and tapes, closed the anterior peritoneum with 2-0  chromic, closed the fascia with continuous running monofilament delayed  absorbable suture, and then placed  subcutaneous JP drains beneath the  panniculus excision on each side.  These were allowed to exit through a  single puncture site in the midline inferior to the incision.  The middle  third of the incision was reapproximated using interrupted 2-0 plain sutures  in the subcutaneous space to obliterate potential space and then staple  closure used throughout the remainder of the skin to result in good skin  edge approximation.  The patient tolerated the procedure well and went to  the recovery room.  Blood loss was estimated at 350 mL.     [name]   JVF/MEDQ  D:  02/27/2004  T:  02/27/2004  Job:  [id number]

## 2011-10-15 ENCOUNTER — Encounter (HOSPITAL_COMMUNITY): Payer: Self-pay

## 2011-10-15 ENCOUNTER — Emergency Department (HOSPITAL_COMMUNITY)
Admission: EM | Admit: 2011-10-15 | Discharge: 2011-10-15 | Disposition: A | Discharge status: Home / Self Care | Payer: Medicaid Other | Attending: Emergency Medicine | Admitting: Emergency Medicine

## 2011-10-15 DIAGNOSIS — IMO0002 Reserved for concepts with insufficient information to code with codable children: Secondary | ICD-10-CM | POA: Insufficient documentation

## 2011-10-15 DIAGNOSIS — M5416 Radiculopathy, lumbar region: Secondary | ICD-10-CM

## 2011-10-15 DIAGNOSIS — M549 Dorsalgia, unspecified: Secondary | ICD-10-CM | POA: Insufficient documentation

## 2011-10-15 MED ORDER — OXYCODONE-ACETAMINOPHEN 5-325 MG PO TABS: 2.0000 | ORAL_TABLET | Freq: Four times a day (QID) | ORAL | Status: AC | PRN

## 2011-10-15 MED ORDER — OXYCODONE-ACETAMINOPHEN 5-325 MG PO TABS
2.0000 | ORAL_TABLET | Freq: Once | ORAL | Status: AC
  Administered 2011-10-15: 2 via ORAL
  Filled 2011-10-15: qty 2

## 2011-10-15 NOTE — Discharge Instructions (Signed)
Lumbosacral Radiculopathy  Take the pain medicine as needed for bad pain or ibuprofen for mild pain. Call Dr. Gerlene Fee or ask your primary care Doctor for referral if not better in a few days Lumbosacral radiculopathy is a pinched nerve or nerves in the low back (lumbosacral area). When this happens you may have weakness in your legs and may not be able to stand on your toes. You may have pain going down into your legs. There may be difficulties with walking normally. There are many causes of this problem. Sometimes this may happen from an injury, or simply from arthritis or boney problems. It may also be caused by other illnesses such as diabetes. If there is no improvement after treatment, further studies may be done to find the exact cause. DIAGNOSIS  X-rays may be needed if the problems become long standing. Electromyograms may be done. This study is one in which the working of nerves and muscles is studied. HOME CARE INSTRUCTIONS   Applications of ice packs may be helpful. Ice can be used in a plastic bag with a towel around it to prevent frostbite to skin. This may be used every 2 hours for 20 to 30 minutes, or as needed, while awake, or as directed by your caregiver.   Only take over-the-counter or prescription medicines for pain, discomfort, or fever as directed by your caregiver.   If physical therapy was prescribed, follow your caregiver's directions.  SEEK IMMEDIATE MEDICAL CARE IF:   You have pain not controlled with medications.   You seem to be getting worse rather than better.   You develop increasing weakness in your legs.   You develop loss of bowel or bladder control.   You have difficulty with walking or balance, or develop clumsiness in the use of your legs.   You have a fever.  MAKE SURE YOU:   Understand these instructions.   Will watch your condition.   Will get help right away if you are not doing well or get worse.  Document Released: 05/19/2005 Document  Revised: 05/08/2011 Document Reviewed: 01/07/2008 [facility] Patient Information 2012 [place], [place].

## 2011-10-15 NOTE — ED Notes (Signed)
Pt complains of back pain, hx of back surgeries, pt sts she can not have an MRI, hx of bulging and ruptured disc.

## 2011-10-15 NOTE — ED Provider Notes (Signed)
History  This chart was scribed for Allena Napoleon, MD by Bennett Scrape. This patient was seen in room STRE5/STRE5 and the patient's care was started at 1:00PM.  CSN: [id number]  Arrival date & time 10/15/11  1233   None     Chief Complaint  Patient presents with  . Back Pain    The history is provided by the patient. No language interpreter was used.    Cheryl Randall is a 36 y.o. female who presents to the Emergency Department complaining of 2 weeks of gradual onset, gradually worsening, constant back pain that radiates shooting pains down her left leg to foot. Pt states that pain became worse while she was working last night after she made a sudden movement. She reports having a h/o two back surgeries. She states that the first surgery was to shave a bulging disc. The second surgery occurred after the disc ruptured where she had a metal plate placed. She states that she made a full recovery. She reports that the pain is similar to the prior bulging disc pain. She reports having tired 6 sessions of epidurals before her first surgery with no improvement in her pain. She reports taking 800 mg of ibuprofen with no improvement in symptoms. She denies fever, loss of bowels or bladder as associated symptoms. She denies smoking and alcohol use. Pain sharp& severe    Pt has a PCP, Dr. Prudy Feeler at [facility].   History reviewed. No pertinent past medical history.  Past Surgical History  Procedure Date  . Back surgery done by Dr. Gerlene Fee  8 or 9 yrs ago  Breast reduction surgery per pt at bedside  History reviewed. No pertinent family history.  History  Substance Use Topics  . Smoking status: Never Smoker   . Smokeless tobacco: Not on file  . Alcohol Use: No    Review of Systems  Constitutional: Negative.   HENT: Negative.   Respiratory: Negative.   Cardiovascular: Negative.   Gastrointestinal: Negative.   Musculoskeletal: Positive for back pain.  Skin: Negative.    Neurological: Negative.   Hematological: Negative.   Psychiatric/Behavioral: Negative.     Allergies  Review of patient's allergies indicates not on file.  Home Medications  No current outpatient prescriptions on file.  Triage Vitals: BP 123/76  Pulse 86  Temp(Src) 97.8 F (36.6 C) (Oral)  Resp 18  SpO2 99%  Physical Exam  Nursing note and vitals reviewed. Constitutional: She is oriented to person, place, and time. She appears well-developed and well-nourished. No distress.  HENT:  Head: Normocephalic and atraumatic.  Eyes: EOM are normal.  Neck: Neck supple. No tracheal deviation present.  Cardiovascular: Normal rate.   Pulmonary/Chest: Effort normal. No respiratory distress.  Abdominal: Soft. There is no tenderness.  Musculoskeletal: Normal range of motion. She exhibits tenderness.       Lumbar tenderness  Neurological: She is alert and oriented to person, place, and time.       DTRs are normal with knee jerk, ankle jerk, biceps, toes going down bilaterally, gait is normal  Skin: Skin is warm and dry.  Psychiatric: She has a normal mood and affect. Her behavior is normal.    ED Course  Procedures (including critical care time)  DIAGNOSTIC STUDIES: Oxygen Saturation is 99% on room air, normal by my interpretation.    COORDINATION OF CARE: 1:09PM-Discussed discharge plan of hydrocodone prescription and advised pt to follow up with her PCP. Pt is not driving home so will give  pt a dose of hydrocodone before discharge.   Labs Reviewed - No data to display No results found.   No diagnosis found.    MDM  Plan prescription Percocet. Followup with Dr. Gerlene Fee or PMD Diagnosis lumbar radiculopathy      I personally performed the services described in this documentation, which was scribed in my presence. The recorded information has been reviewed and considered.   Doug Sou, MD 10/15/11 1318

## 2011-10-15 NOTE — ED Notes (Signed)
Pt reporting lower back pain x 2 weeks. Hx of bulging/ruptured disc, reports "pain feels like same". C/o burning and tingling down left left. Can move all extremities. Pt reporting pain 10/10, pt tearful with movement.

## 2013-02-09 ENCOUNTER — Other Ambulatory Visit (INDEPENDENT_AMBULATORY_CARE_PROVIDER_SITE_OTHER): Payer: Self-pay

## 2013-02-09 ENCOUNTER — Encounter (INDEPENDENT_AMBULATORY_CARE_PROVIDER_SITE_OTHER): Payer: Self-pay

## 2013-02-14 ENCOUNTER — Ambulatory Visit (INDEPENDENT_AMBULATORY_CARE_PROVIDER_SITE_OTHER): Payer: Medicaid Other | Admitting: Surgery

## 2013-02-14 ENCOUNTER — Encounter (INDEPENDENT_AMBULATORY_CARE_PROVIDER_SITE_OTHER): Payer: Self-pay | Admitting: Surgery

## 2013-02-14 VITALS — BP 140/78 | HR 84 | Temp 97.8°F | Resp 20 | Ht 67.0 in | Wt 208.0 lb

## 2013-02-14 DIAGNOSIS — N6019 Diffuse cystic mastopathy of unspecified breast: Secondary | ICD-10-CM

## 2013-02-14 DIAGNOSIS — N6012 Diffuse cystic mastopathy of left breast: Secondary | ICD-10-CM

## 2013-02-14 DIAGNOSIS — N644 Mastodynia: Secondary | ICD-10-CM

## 2013-02-14 NOTE — Patient Instructions (Addendum)
Fibrocystic Breast Changes Fibrocystic breast changes is a non-cancerous(benign) condition that about half of all women have at some time in their life. It is also called benign breast disease and mammary dysplasia. It may also be called fibrocystic breast disease, but it is not really a disease. It is a common condition that occurs when women go through the hormonal changes during their menstrual cycle, between the ages of 73 to 70. Menopausal women do not have this problem, unless they are on hormone therapy. It can affect one or both breasts. This is not a sign that you will later get cancer. CAUSES  Overgrowth of cells lining the milk ducts, or enlarged lobules in the breast, cause the breast duct to become blocked. The duct then fills up with fluid. This is like a small balloon filled with water. It is called a cyst. Over time, with repeated inflammation there is a tendency to form scar tissue. This scar tissue becomes the fibrous part of fibrocystic disease. The exact cause of this happening is not known, but it may be related to the female hormones, estrogen and progesterone. Heredity (genetics) may also be a factor in some cases. SYMPTOMS   Tenderness.  Swelling.  Rope-like feeling.  Lumpy breast, one or both sides.  Changes in the size of the breasts, before and after the menstrual period (larger before, smaller after).  Green or dark brown nipple discharge (not blood). Symptoms are usually worse before periods (menstrual cycle) and get better toward the end of menstruation. Usually, it is temporary minor discomfort. But some women have severe pain.  DIAGNOSIS  Check your breasts monthly. The best time to check your breasts is after your period. If you check them during your period, you are more likely to feel the normal glands enlarged, as a result of the hormonal changes that happen right before your period. If you do not have menstrual periods, check your breasts the first day of every  month. Become familiar with the way your own breasts feel. It is then easier to notice if there are changes, such as more tenderness, a new growth, change in breast size, or a change in a lump that has always been there. All breasts lumps need to be investigated, to rule out breast cancer. See your caregiver as soon as possible, if you find a lump. Most breast lumps are not cancerous. Excellent treatment is available for ones that are.  To make a diagnosis, your caregiver will examine your breasts and may recommend other tests, such as:  Mammogram (breast X-ray).  Ultrasound.  MRI (magnetic resonance imaging).  Removing fluid from the cyst with a fine needle, under local anesthesia (aspiration).  Taking a breast tissue sample (breast biopsy). Some questions your caregiver will ask are:  What was the date of your last period?  When did the lump show up?  Is there any discharge from your breast?  Is the breast tender or painful?  Are the symptoms in one or both breasts?  Has the lump changed in size from month-to-month? How long has it been present?  Any family history of breast problems?  Any past breast problems?  Any history of breast surgery?  Are you taking any medications?  When was your last mammogram, and where was it done? TREATMENT   Dietary changes help to prevent or reduce the symptoms of fibrocystic breast changes.  You may need to stop consuming all foods that contain caffeine, such as chocolate, sodas, coffee, and tea.  Reducing sugar and fat in your diet may also help.  Decrease estrogen in your diet. Some sources include commercially raised meats which contain estrogen. Eliminate other natural estrogens.  Birth control pills can also make symptoms worse.  Natural progesterone cream, applied at a dose of 15 to 20 milligrams per day, from ovulation until a day or two before your period returns, may help with returning to normal breast tissue over several  months. Seek advice from your caregiver.  Over-the-counter pain pills may help, as recommended by your caregiver.  Danazol hormone (female-like hormone) is sometimes used. It may cause hair growth and acne.  Needle aspiration can be used, to remove fluid from the cyst.  Surgery may be needed, to remove a large, persistent, and tender cyst.  Evening primrose oil may help with the tenderness and pain. It has linolenic acid that women may not have enough of. HOME CARE INSTRUCTIONS   Examine your breasts after every menstrual period.  If you do not have menstrual periods, examine your breasts the first day of every month.  Wear a firm support bra, especially when exercising.  Decrease or avoid caffeine in your diet.  Decrease the fat and sugar in your diet.  Eat a balanced diet.  Try to see your caregiver after you have a menstrual period.  Before seeing your caregiver, make notes about:  When you have the symptoms.  What types of symptoms you are having.  Medications you are taking.  When and where your last mammogram was taken.  Past breast problems or breast surgery. SEEK MEDICAL CARE IF:   You have been diagnosed with fibrocystic breast changes, and you develop changes in your breast:  Discharge from the nipple, especially bloody discharge.  Pain in the breast that does not go away after your menstrual period.  New lumps or bumps in the breast.  Lumps in your armpit.  Your breast or breasts become enlarged, red, and painful.  You find an isolated lump, even if it is not tender.  You have questions about this condition that have not been answered. Document Released: 03/05/2006 Document Revised: 08/11/2011 Document Reviewed: 05/30/2009 [facility] Patient Information 2014 [place], [place].

## 2013-02-14 NOTE — Progress Notes (Signed)
Patient ID: Cheryl Randall, female   DOB: 02/13/1976, 37 y.o.   MRN: [id number]  Chief Complaint  Patient presents with  . New Evaluation    eval breast cyst    HPI Cheryl Randall is a 37 y.o. female.  Patient returns for followup of fibrocystic breast disease. She was seen in 2011 do to painful areas in both breasts. This is felt to be fibrocystic in nature. She developed more pain now in her left breast upper outer quadrant with increased nodularity. The right breast is tender as well right upper outer quadrant with nodularity. She has not had a baseline screening mammogram yet. Her left breast and hurting more over last month. No redness or drainage. The pain is sharp in nature and radiates toward the nipple. This is bilateral left greater than right history of breast reduction 2002 HPI  Past Medical History  Diagnosis Date  . DDD (degenerative disc disease)   . Migraine   . Diabetes mellitus without complication     Past Surgical History  Procedure Laterality Date  . Back surgery    . Breast surgery      reduction  . Cholecystectomy      Family History  Problem Relation Age of Onset  . Cancer Father     bladder    Social History History  Substance Use Topics  . Smoking status: Former Games developer  . Smokeless tobacco: Never Used  . Alcohol Use: No    No Known Allergies  Current Outpatient Prescriptions  Medication Sig Dispense Refill  . ibuprofen (ADVIL,MOTRIN) 800 MG tablet Take 800 mg by mouth every 8 (eight) hours as needed. As needed for pharmacy.       No current facility-administered medications for this visit.    Review of Systems Review of Systems  Constitutional: Negative for fever, chills and unexpected weight change.  HENT: Negative for hearing loss, congestion, sore throat, trouble swallowing and voice change.   Eyes: Negative for visual disturbance.  Respiratory: Negative for cough and wheezing.   Cardiovascular: Negative for chest pain, palpitations  and leg swelling.  Gastrointestinal: Negative for nausea, vomiting, abdominal pain, diarrhea, constipation, blood in stool, abdominal distention and anal bleeding.  Genitourinary: Negative for hematuria, vaginal bleeding and difficulty urinating.  Musculoskeletal: Negative for arthralgias.  Skin: Negative for rash and wound.  Neurological: Negative for seizures, syncope and headaches.  Hematological: Negative for adenopathy. Does not bruise/bleed easily.  Psychiatric/Behavioral: Negative for confusion.    Blood pressure 140/78, pulse 84, temperature 97.8 F (36.6 C), temperature source Oral, resp. rate 20, height 5\' 7"  (1.702 m), weight 208 lb (94.348 kg).  Physical Exam Physical Exam  Constitutional: She is oriented to person, place, and time. She appears well-developed and well-nourished.  HENT:  Head: Normocephalic and atraumatic.  Eyes: Pupils are equal, round, and reactive to light. No scleral icterus.  Neck: Normal range of motion. Neck supple.  Pulmonary/Chest: Right breast exhibits tenderness. Left breast exhibits tenderness.    Musculoskeletal: Normal range of motion.  Neurological: She is alert and oriented to person, place, and time.  Skin: Skin is warm.  Psychiatric: She has a normal mood and affect. Her behavior is normal. Judgment and thought content normal.    Data Reviewed none  Assessment    Fibrocystic breast disease with mastalgia bilateral and family history of breast cancer    Plan    Patient needs baseline mammogram after age. Unfortunately no surgical intervention is successful with fibrocystic breast disease. Discussed medical  management of this disease process. We discussed the pathophysiology and natural progression of disease. Discussed medications which could make it better and side effects of these medications. She has no interest at this point in medical management. Recommend decreasing caffeine intake, ibuprofen for pain, vitamin E supplementation  and Primrose oil as well.        Ravon Mortellaro A. 02/14/2013, 10:35 AM

## 2013-03-03 ENCOUNTER — Ambulatory Visit
Admission: RE | Admit: 2013-03-03 | Discharge: 2013-03-03 | Disposition: A | Discharge status: Home / Self Care | Payer: Medicaid Other | Source: Ambulatory Visit | Attending: Surgery | Admitting: Surgery

## 2013-03-03 ENCOUNTER — Other Ambulatory Visit (INDEPENDENT_AMBULATORY_CARE_PROVIDER_SITE_OTHER): Payer: Self-pay | Admitting: Surgery

## 2013-03-03 DIAGNOSIS — N63 Unspecified lump in unspecified breast: Secondary | ICD-10-CM

## 2013-03-03 DIAGNOSIS — N644 Mastodynia: Secondary | ICD-10-CM

## 2014-04-07 ENCOUNTER — Ambulatory Visit (HOSPITAL_COMMUNITY)
Admission: RE | Admit: 2014-04-07 | Discharge: 2014-04-07 | Disposition: A | Discharge status: Home / Self Care | Payer: Disability Insurance | Source: Ambulatory Visit | Attending: Family Medicine | Admitting: Family Medicine

## 2014-04-07 ENCOUNTER — Other Ambulatory Visit (HOSPITAL_COMMUNITY): Payer: Self-pay | Admitting: Family Medicine

## 2014-04-07 DIAGNOSIS — M545 Low back pain, unspecified: Secondary | ICD-10-CM

## 2014-04-07 DIAGNOSIS — Y793 Surgical instruments, materials and orthopedic devices (including sutures) associated with adverse incidents: Secondary | ICD-10-CM | POA: Diagnosis not present

## 2014-04-07 DIAGNOSIS — T84216A Breakdown (mechanical) of internal fixation device of vertebrae, initial encounter: Secondary | ICD-10-CM | POA: Insufficient documentation

## 2015-04-11 ENCOUNTER — Other Ambulatory Visit: Payer: Self-pay | Admitting: Neurosurgery

## 2015-04-11 DIAGNOSIS — M5416 Radiculopathy, lumbar region: Secondary | ICD-10-CM

## 2015-04-13 ENCOUNTER — Ambulatory Visit
Admission: RE | Admit: 2015-04-13 | Discharge: 2015-04-13 | Disposition: A | Discharge status: Home / Self Care | Payer: Managed Care, Other (non HMO) | Source: Ambulatory Visit | Attending: Neurosurgery | Admitting: Neurosurgery

## 2015-04-13 DIAGNOSIS — M5416 Radiculopathy, lumbar region: Secondary | ICD-10-CM

## 2015-04-13 MED ORDER — HYDROXYZINE HCL 50 MG/ML IM SOLN
25.0000 mg | Freq: Once | INTRAMUSCULAR | Status: AC
  Administered 2015-04-13: 25 mg via INTRAMUSCULAR

## 2015-04-13 MED ORDER — MEPERIDINE HCL 100 MG/ML IJ SOLN
100.0000 mg | Freq: Once | INTRAMUSCULAR | Status: AC
  Administered 2015-04-13: 100 mg via INTRAMUSCULAR

## 2015-04-13 MED ORDER — DIAZEPAM 5 MG PO TABS
10.0000 mg | ORAL_TABLET | Freq: Once | ORAL | Status: AC
  Administered 2015-04-13: 10 mg via ORAL

## 2015-04-13 MED ORDER — IOHEXOL 180 MG/ML  SOLN
17.0000 mL | Freq: Once | INTRAMUSCULAR | Status: DC | PRN
  Administered 2015-04-13: 17 mL via INTRATHECAL

## 2015-04-13 NOTE — Discharge Instructions (Signed)

## 2015-04-18 ENCOUNTER — Other Ambulatory Visit (HOSPITAL_COMMUNITY): Payer: Self-pay | Admitting: Neurosurgery

## 2015-05-22 NOTE — Pre-Procedure Instructions (Signed)
Cheryl Randall  05/22/2015     Your procedure is scheduled on : Thursday May 31, 2015 at 7:30 AM.  Report to [facility] Admitting at 5:30 AM.  Call this number if you have problems the morning of surgery: [phone]    Remember:  Do not eat food or drink liquids after midnight.  Take these medicines the morning of surgery with A SIP OF WATER : Acetaminophen (Tylenol) if needed, Sertraline (Zoloft)   Stop taking any vitamins, herbal medications, Ibuprofen, Advil, Motrin, Aleve etc on Thursday December 22nd   Do not wear jewelry, make-up or nail polish.  Do not wear lotions, powders, or perfumes.    Do not shave 48 hours prior to surgery.    Do not bring valuables to the hospital.  [facility] is not responsible for any belongings or valuables.  Contacts, dentures or bridgework may not be worn into surgery.  Leave your suitcase in the car.  After surgery it may be brought to your room.  For patients admitted to the hospital, discharge time will be determined by your treatment team.  Patients discharged the day of surgery will not be allowed to drive home.   Name and phone number of your driver:    Special instructions:  Shower using CHG soap the night before and the morning of your surgery  Please read over the following fact sheets that you were given. Pain Booklet, Coughing and Deep Breathing, MRSA Information and Surgical Site Infection Prevention

## 2015-05-23 ENCOUNTER — Encounter (HOSPITAL_COMMUNITY): Payer: Self-pay

## 2015-05-23 ENCOUNTER — Encounter (HOSPITAL_COMMUNITY)
Admission: RE | Admit: 2015-05-23 | Discharge: 2015-05-23 | Disposition: A | Discharge status: Home / Self Care | Payer: 59 | Source: Ambulatory Visit | Attending: Neurosurgery | Admitting: Neurosurgery

## 2015-05-23 DIAGNOSIS — Z0183 Encounter for blood typing: Secondary | ICD-10-CM | POA: Insufficient documentation

## 2015-05-23 DIAGNOSIS — Z01812 Encounter for preprocedural laboratory examination: Secondary | ICD-10-CM | POA: Insufficient documentation

## 2015-05-23 DIAGNOSIS — M4800 Spinal stenosis, site unspecified: Secondary | ICD-10-CM | POA: Diagnosis not present

## 2015-05-23 HISTORY — DX: Family history of other specified conditions: Z84.89

## 2015-05-23 HISTORY — DX: Anxiety disorder, unspecified: F41.9

## 2015-05-23 HISTORY — DX: Other specified postprocedural states: Z98.890

## 2015-05-23 HISTORY — DX: Nausea with vomiting, unspecified: R11.2

## 2015-05-23 LAB — TYPE AND SCREEN
ABO/RH(D): O POS
Antibody Screen: NEGATIVE

## 2015-05-23 LAB — CBC
HEMATOCRIT: 41.4 % (ref 36.0–46.0)
HEMOGLOBIN: 13.3 g/dL (ref 12.0–15.0)
MCH: 28.4 pg (ref 26.0–34.0)
MCHC: 32.1 g/dL (ref 30.0–36.0)
MCV: 88.5 fL (ref 78.0–100.0)
Platelets: 203 10*3/uL (ref 150–400)
RBC: 4.68 MIL/uL (ref 3.87–5.11)
RDW: 13.2 % (ref 11.5–15.5)
WBC: 6.4 10*3/uL (ref 4.0–10.5)

## 2015-05-23 LAB — SURGICAL PCR SCREEN
MRSA, PCR: NEGATIVE
STAPHYLOCOCCUS AUREUS: NEGATIVE

## 2015-05-23 LAB — BASIC METABOLIC PANEL
Anion gap: 7 (ref 5–15)
BUN: 15 mg/dL (ref 6–20)
CHLORIDE: 107 mmol/L (ref 101–111)
CO2: 26 mmol/L (ref 22–32)
CREATININE: 0.65 mg/dL (ref 0.44–1.00)
Calcium: 9.2 mg/dL (ref 8.9–10.3)
GFR calc non Af Amer: >60 mL/min (ref >60)
Glucose, Bld: 101 mg/dL — ABNORMAL HIGH (ref 65–99)
POTASSIUM: 4.5 mmol/L (ref 3.5–5.1)
SODIUM: 140 mmol/L (ref 135–145)

## 2015-05-23 LAB — ABO/RH: ABO/RH(D): O POS

## 2015-05-23 NOTE — Progress Notes (Signed)
PCP is [name], PA-C  Patient denied having any cardiac or pulmonary issues  Nurse inquired about patient having diabetes and patient denied having diabetes. Patient stated "it runs in my family, but I am not diabetic. I was a lot heavier and I was told I was borderline, but I'm not diabetic." Patient denied having to check her blood glucose at home, and patient was unaware of what a A1C was.

## 2015-05-24 LAB — HEMOGLOBIN A1C
HEMOGLOBIN A1C: 5.8 % — AB (ref 4.8–5.6)
MEAN PLASMA GLUCOSE: 120 mg/dL

## 2015-05-30 ENCOUNTER — Other Ambulatory Visit (HOSPITAL_COMMUNITY): Payer: Self-pay | Admitting: Neurosurgery

## 2015-05-30 MED ORDER — CEFAZOLIN SODIUM-DEXTROSE 2-3 GM-% IV SOLR
2.0000 g | INTRAVENOUS | Status: AC
  Administered 2015-05-31: 2 g via INTRAVENOUS
  Filled 2015-05-30: qty 50

## 2015-05-30 MED ORDER — DEXAMETHASONE SODIUM PHOSPHATE 10 MG/ML IJ SOLN
10.0000 mg | INTRAMUSCULAR | Status: DC
  Filled 2015-05-30: qty 1

## 2015-05-31 ENCOUNTER — Inpatient Hospital Stay (HOSPITAL_COMMUNITY)
Admission: RE | Admit: 2015-05-31 | Discharge: 2015-06-02 | DRG: 460 | Disposition: A | Discharge status: Home / Self Care | Payer: 59 | Source: Ambulatory Visit | Attending: Neurosurgery | Admitting: Neurosurgery

## 2015-05-31 ENCOUNTER — Inpatient Hospital Stay (HOSPITAL_COMMUNITY): Payer: 59

## 2015-05-31 ENCOUNTER — Inpatient Hospital Stay (HOSPITAL_COMMUNITY): Payer: 59 | Admitting: Anesthesiology

## 2015-05-31 ENCOUNTER — Encounter (HOSPITAL_COMMUNITY)
Admission: RE | Disposition: A | Discharge status: Home / Self Care | Payer: Self-pay | Source: Ambulatory Visit | Attending: Neurosurgery

## 2015-05-31 ENCOUNTER — Encounter (HOSPITAL_COMMUNITY): Payer: Self-pay | Admitting: *Deleted

## 2015-05-31 DIAGNOSIS — Y838 Other surgical procedures as the cause of abnormal reaction of the patient, or of later complication, without mention of misadventure at the time of the procedure: Secondary | ICD-10-CM | POA: Diagnosis present

## 2015-05-31 DIAGNOSIS — E119 Type 2 diabetes mellitus without complications: Secondary | ICD-10-CM | POA: Diagnosis present

## 2015-05-31 DIAGNOSIS — Y793 Surgical instruments, materials and orthopedic devices (including sutures) associated with adverse incidents: Secondary | ICD-10-CM | POA: Diagnosis present

## 2015-05-31 DIAGNOSIS — M960 Pseudarthrosis after fusion or arthrodesis: Secondary | ICD-10-CM | POA: Diagnosis present

## 2015-05-31 DIAGNOSIS — G43909 Migraine, unspecified, not intractable, without status migrainosus: Secondary | ICD-10-CM | POA: Diagnosis present

## 2015-05-31 DIAGNOSIS — M79605 Pain in left leg: Secondary | ICD-10-CM | POA: Diagnosis present

## 2015-05-31 DIAGNOSIS — M549 Dorsalgia, unspecified: Secondary | ICD-10-CM

## 2015-05-31 DIAGNOSIS — Z87891 Personal history of nicotine dependence: Secondary | ICD-10-CM | POA: Diagnosis not present

## 2015-05-31 DIAGNOSIS — F419 Anxiety disorder, unspecified: Secondary | ICD-10-CM | POA: Diagnosis present

## 2015-05-31 DIAGNOSIS — S32009K Unspecified fracture of unspecified lumbar vertebra, subsequent encounter for fracture with nonunion: Secondary | ICD-10-CM | POA: Diagnosis present

## 2015-05-31 HISTORY — DX: Unspecified fracture of unspecified lumbar vertebra, subsequent encounter for fracture with nonunion: S32.009K

## 2015-05-31 LAB — GLUCOSE, CAPILLARY
Glucose-Capillary: 146 mg/dL — ABNORMAL HIGH (ref 65–99)
Glucose-Capillary: 117 mg/dL — ABNORMAL HIGH (ref 65–99)

## 2015-05-31 SURGERY — POSTERIOR LUMBAR FUSION 1 WITH HARDWARE REMOVAL
Anesthesia: General | Site: Back

## 2015-05-31 MED ORDER — THROMBIN 20000 UNITS EX SOLR
CUTANEOUS | Status: DC | PRN
  Administered 2015-05-31: 20 mL via TOPICAL

## 2015-05-31 MED ORDER — NEOSTIGMINE METHYLSULFATE 10 MG/10ML IV SOLN
INTRAVENOUS | Status: AC
  Filled 2015-05-31: qty 2

## 2015-05-31 MED ORDER — NEOSTIGMINE METHYLSULFATE 10 MG/10ML IV SOLN
INTRAVENOUS | Status: DC | PRN
  Administered 2015-05-31: 5 mg via INTRAVENOUS

## 2015-05-31 MED ORDER — ONDANSETRON HCL 4 MG/2ML IJ SOLN
INTRAMUSCULAR | Status: DC | PRN
  Administered 2015-05-31 (×2): 4 mg via INTRAVENOUS

## 2015-05-31 MED ORDER — FENTANYL CITRATE (PF) 250 MCG/5ML IJ SOLN
INTRAMUSCULAR | Status: AC
  Filled 2015-05-31: qty 5

## 2015-05-31 MED ORDER — MIDAZOLAM HCL 2 MG/2ML IJ SOLN
INTRAMUSCULAR | Status: AC
  Filled 2015-05-31: qty 2

## 2015-05-31 MED ORDER — SODIUM CHLORIDE 0.9 % IJ SOLN
INTRAMUSCULAR | Status: AC
  Filled 2015-05-31: qty 10

## 2015-05-31 MED ORDER — VANCOMYCIN HCL 1000 MG IV SOLR
INTRAVENOUS | Status: AC
  Filled 2015-05-31: qty 1000

## 2015-05-31 MED ORDER — SODIUM CHLORIDE 0.9 % IJ SOLN: 3.0000 mL | INTRAMUSCULAR | Status: DC | PRN

## 2015-05-31 MED ORDER — VANCOMYCIN HCL 1000 MG IV SOLR
INTRAVENOUS | Status: DC | PRN
  Administered 2015-05-31: 1000 mg

## 2015-05-31 MED ORDER — LACTATED RINGERS IV SOLN
INTRAVENOUS | Status: DC | PRN
  Administered 2015-05-31 (×3): via INTRAVENOUS

## 2015-05-31 MED ORDER — OXYCODONE-ACETAMINOPHEN 5-325 MG PO TABS
ORAL_TABLET | ORAL | Status: AC
  Filled 2015-05-31: qty 2

## 2015-05-31 MED ORDER — ACETAMINOPHEN 325 MG PO TABS: 650.0000 mg | ORAL_TABLET | ORAL | Status: DC | PRN

## 2015-05-31 MED ORDER — KCL IN DEXTROSE-NACL 20-5-0.45 MEQ/L-%-% IV SOLN: 80.0000 mL/h | INTRAVENOUS | Status: DC

## 2015-05-31 MED ORDER — SUGAMMADEX SODIUM 200 MG/2ML IV SOLN
INTRAVENOUS | Status: AC
  Filled 2015-05-31: qty 2

## 2015-05-31 MED ORDER — ONDANSETRON HCL 4 MG/2ML IJ SOLN: 4.0000 mg | INTRAMUSCULAR | Status: DC | PRN

## 2015-05-31 MED ORDER — GLYCOPYRROLATE 0.2 MG/ML IJ SOLN
INTRAMUSCULAR | Status: AC
  Filled 2015-05-31: qty 4

## 2015-05-31 MED ORDER — ACETAMINOPHEN 650 MG RE SUPP: 650.0000 mg | RECTAL | Status: DC | PRN

## 2015-05-31 MED ORDER — BISACODYL 5 MG PO TBEC: 5.0000 mg | DELAYED_RELEASE_TABLET | Freq: Every day | ORAL | Status: DC | PRN

## 2015-05-31 MED ORDER — PHENYLEPHRINE HCL 10 MG/ML IJ SOLN
10.0000 mg | INTRAVENOUS | Status: DC | PRN
  Administered 2015-05-31: 15 ug/min via INTRAVENOUS

## 2015-05-31 MED ORDER — SUGAMMADEX SODIUM 200 MG/2ML IV SOLN
INTRAVENOUS | Status: DC | PRN
  Administered 2015-05-31: 100 mg via INTRAVENOUS

## 2015-05-31 MED ORDER — GLYCOPYRROLATE 0.2 MG/ML IJ SOLN
INTRAMUSCULAR | Status: DC | PRN
  Administered 2015-05-31: .8 mg via INTRAVENOUS

## 2015-05-31 MED ORDER — PROPOFOL 10 MG/ML IV BOLUS
INTRAVENOUS | Status: AC
  Filled 2015-05-31: qty 40

## 2015-05-31 MED ORDER — CEFAZOLIN SODIUM-DEXTROSE 2-3 GM-% IV SOLR
2.0000 g | Freq: Three times a day (TID) | INTRAVENOUS | Status: AC
  Administered 2015-05-31 (×2): 2 g via INTRAVENOUS
  Filled 2015-05-31 (×2): qty 50

## 2015-05-31 MED ORDER — OXYCODONE-ACETAMINOPHEN 5-325 MG PO TABS
1.0000 | ORAL_TABLET | ORAL | Status: DC | PRN
  Administered 2015-05-31 – 2015-06-01 (×6): 2 via ORAL
  Filled 2015-05-31 (×5): qty 2

## 2015-05-31 MED ORDER — SODIUM CHLORIDE 0.9 % IR SOLN
Status: DC | PRN
  Administered 2015-05-31: 500 mL

## 2015-05-31 MED ORDER — ARTIFICIAL TEARS OP OINT
TOPICAL_OINTMENT | OPHTHALMIC | Status: AC
  Filled 2015-05-31: qty 7

## 2015-05-31 MED ORDER — PROMETHAZINE HCL 25 MG/ML IJ SOLN
INTRAMUSCULAR | Status: AC
  Filled 2015-05-31: qty 1

## 2015-05-31 MED ORDER — HYDROMORPHONE HCL 1 MG/ML IJ SOLN
0.2500 mg | INTRAMUSCULAR | Status: DC | PRN
  Administered 2015-05-31 (×3): 0.5 mg via INTRAVENOUS

## 2015-05-31 MED ORDER — MIDAZOLAM HCL 5 MG/5ML IJ SOLN
INTRAMUSCULAR | Status: DC | PRN
  Administered 2015-05-31: 1 mg via INTRAVENOUS

## 2015-05-31 MED ORDER — MENTHOL 3 MG MT LOZG
1.0000 | LOZENGE | OROMUCOSAL | Status: DC | PRN
  Administered 2015-06-01: 3 mg via ORAL
  Filled 2015-05-31: qty 9

## 2015-05-31 MED ORDER — 0.9 % SODIUM CHLORIDE (POUR BTL) OPTIME
TOPICAL | Status: DC | PRN
  Administered 2015-05-31: 1000 mL

## 2015-05-31 MED ORDER — SCOPOLAMINE 1 MG/3DAYS TD PT72
MEDICATED_PATCH | TRANSDERMAL | Status: AC
  Administered 2015-05-31: 1 via TRANSDERMAL
  Filled 2015-05-31: qty 1

## 2015-05-31 MED ORDER — SERTRALINE HCL 50 MG PO TABS
50.0000 mg | ORAL_TABLET | Freq: Every day | ORAL | Status: DC
  Filled 2015-05-31: qty 1

## 2015-05-31 MED ORDER — DEXAMETHASONE SODIUM PHOSPHATE 4 MG/ML IJ SOLN
INTRAMUSCULAR | Status: AC
  Filled 2015-05-31: qty 2

## 2015-05-31 MED ORDER — PHENOL 1.4 % MT LIQD: 1.0000 | OROMUCOSAL | Status: DC | PRN

## 2015-05-31 MED ORDER — LIDOCAINE HCL (CARDIAC) 20 MG/ML IV SOLN
INTRAVENOUS | Status: DC | PRN
  Administered 2015-05-31: 40 mg via INTRAVENOUS

## 2015-05-31 MED ORDER — SUCCINYLCHOLINE CHLORIDE 20 MG/ML IJ SOLN
INTRAMUSCULAR | Status: AC
  Filled 2015-05-31: qty 1

## 2015-05-31 MED ORDER — PHENYLEPHRINE 40 MCG/ML (10ML) SYRINGE FOR IV PUSH (FOR BLOOD PRESSURE SUPPORT)
PREFILLED_SYRINGE | INTRAVENOUS | Status: AC
  Filled 2015-05-31: qty 20

## 2015-05-31 MED ORDER — EPHEDRINE SULFATE 50 MG/ML IJ SOLN
INTRAMUSCULAR | Status: AC
  Filled 2015-05-31: qty 1

## 2015-05-31 MED ORDER — ARTIFICIAL TEARS OP OINT
TOPICAL_OINTMENT | OPHTHALMIC | Status: DC | PRN
  Administered 2015-05-31: 1 via OPHTHALMIC

## 2015-05-31 MED ORDER — FENTANYL CITRATE (PF) 100 MCG/2ML IJ SOLN
INTRAMUSCULAR | Status: DC | PRN
  Administered 2015-05-31: 100 ug via INTRAVENOUS
  Administered 2015-05-31: 50 ug via INTRAVENOUS
  Administered 2015-05-31: 25 ug via INTRAVENOUS
  Administered 2015-05-31 (×2): 50 ug via INTRAVENOUS

## 2015-05-31 MED ORDER — BUPIVACAINE HCL (PF) 0.5 % IJ SOLN
INTRAMUSCULAR | Status: DC | PRN
  Administered 2015-05-31: 30 mL

## 2015-05-31 MED ORDER — HYDROMORPHONE HCL 1 MG/ML IJ SOLN
INTRAMUSCULAR | Status: AC
  Administered 2015-05-31: 0.5 mg via INTRAVENOUS
  Filled 2015-05-31: qty 1

## 2015-05-31 MED ORDER — DEXAMETHASONE SODIUM PHOSPHATE 4 MG/ML IJ SOLN
INTRAMUSCULAR | Status: DC | PRN
  Administered 2015-05-31: 4 mg via INTRAVENOUS

## 2015-05-31 MED ORDER — ONDANSETRON HCL 4 MG/2ML IJ SOLN
INTRAMUSCULAR | Status: AC
  Filled 2015-05-31: qty 4

## 2015-05-31 MED ORDER — PROPOFOL 10 MG/ML IV BOLUS
INTRAVENOUS | Status: DC | PRN
  Administered 2015-05-31: 200 mg via INTRAVENOUS

## 2015-05-31 MED ORDER — PHENYLEPHRINE HCL 10 MG/ML IJ SOLN
INTRAMUSCULAR | Status: DC | PRN
  Administered 2015-05-31: 40 ug via INTRAVENOUS
  Administered 2015-05-31 (×3): 80 ug via INTRAVENOUS

## 2015-05-31 MED ORDER — ROCURONIUM BROMIDE 50 MG/5ML IV SOLN
INTRAVENOUS | Status: AC
  Filled 2015-05-31: qty 2

## 2015-05-31 MED ORDER — CYCLOBENZAPRINE HCL 10 MG PO TABS
10.0000 mg | ORAL_TABLET | Freq: Three times a day (TID) | ORAL | Status: DC | PRN
  Administered 2015-05-31 – 2015-06-02 (×3): 10 mg via ORAL
  Filled 2015-05-31 (×3): qty 1

## 2015-05-31 MED ORDER — ROCURONIUM BROMIDE 100 MG/10ML IV SOLN
INTRAVENOUS | Status: DC | PRN
  Administered 2015-05-31: 5 mg via INTRAVENOUS
  Administered 2015-05-31: 20 mg via INTRAVENOUS
  Administered 2015-05-31: 50 mg via INTRAVENOUS
  Administered 2015-05-31: 10 mg via INTRAVENOUS

## 2015-05-31 MED ORDER — PROMETHAZINE HCL 25 MG/ML IJ SOLN
6.2500 mg | INTRAMUSCULAR | Status: DC | PRN
  Administered 2015-05-31: 6.25 mg via INTRAVENOUS

## 2015-05-31 MED ORDER — DOCUSATE SODIUM 100 MG PO CAPS
100.0000 mg | ORAL_CAPSULE | Freq: Two times a day (BID) | ORAL | Status: DC
  Administered 2015-05-31 – 2015-06-01 (×3): 100 mg via ORAL
  Filled 2015-05-31 (×3): qty 1

## 2015-05-31 MED ORDER — SODIUM CHLORIDE 0.9 % IJ SOLN
3.0000 mL | Freq: Two times a day (BID) | INTRAMUSCULAR | Status: DC
  Administered 2015-05-31 (×2): 3 mL via INTRAVENOUS

## 2015-05-31 MED ORDER — HYDROMORPHONE HCL 1 MG/ML IJ SOLN: 1.0000 mg | INTRAMUSCULAR | Status: DC | PRN

## 2015-05-31 MED ORDER — LIDOCAINE HCL (CARDIAC) 20 MG/ML IV SOLN
INTRAVENOUS | Status: AC
  Filled 2015-05-31: qty 10

## 2015-05-31 MED ORDER — PANTOPRAZOLE SODIUM 40 MG IV SOLR
40.0000 mg | Freq: Every day | INTRAVENOUS | Status: DC
  Administered 2015-05-31: 40 mg via INTRAVENOUS
  Filled 2015-05-31: qty 40

## 2015-05-31 SURGICAL SUPPLY — 56 items
BAG DECANTER FOR FLEXI CONT (MISCELLANEOUS) ×3 IMPLANT
BENZOIN TINCTURE PRP APPL 2/3 (GAUZE/BANDAGES/DRESSINGS) ×3 IMPLANT
BLADE CLIPPER SURG (BLADE) ×3 IMPLANT
BRUSH SCRUB EZ PLAIN DRY (MISCELLANEOUS) ×3 IMPLANT
BUR CUTTER 7.0 ROUND (BURR) ×6 IMPLANT
BUR MATCHSTICK NEURO 3.0 LAGG (BURR) ×3 IMPLANT
CANISTER SUCT 3000ML PPV (MISCELLANEOUS) ×3 IMPLANT
CLOSURE WOUND 1/2 X4 (GAUZE/BANDAGES/DRESSINGS) ×2
CONT SPEC 4OZ CLIKSEAL STRL BL (MISCELLANEOUS) ×3 IMPLANT
COVER BACK TABLE 24X17X13 BIG (DRAPES) IMPLANT
COVER BACK TABLE 60X90IN (DRAPES) ×3 IMPLANT
DRAPE C-ARM 42X72 X-RAY (DRAPES) ×12 IMPLANT
DRAPE LAPAROTOMY 100X72X124 (DRAPES) ×3 IMPLANT
DRAPE SURG 17X23 STRL (DRAPES) ×6 IMPLANT
DRSG OPSITE POSTOP 4X6 (GAUZE/BANDAGES/DRESSINGS) ×3 IMPLANT
DURAPREP 26ML APPLICATOR (WOUND CARE) ×3 IMPLANT
ELECT REM PT RETURN 9FT ADLT (ELECTROSURGICAL) ×3
ELECTRODE REM PT RTRN 9FT ADLT (ELECTROSURGICAL) ×1 IMPLANT
EVACUATOR 1/8 PVC DRAIN (DRAIN) ×3 IMPLANT
GAUZE SPONGE 4X4 12PLY STRL (GAUZE/BANDAGES/DRESSINGS) ×3 IMPLANT
GAUZE SPONGE 4X4 16PLY XRAY LF (GAUZE/BANDAGES/DRESSINGS) IMPLANT
GLOVE ECLIPSE 8.0 STRL XLNG CF (GLOVE) ×6 IMPLANT
GOWN STRL REUS W/ TWL LRG LVL3 (GOWN DISPOSABLE) IMPLANT
GOWN STRL REUS W/ TWL XL LVL3 (GOWN DISPOSABLE) ×2 IMPLANT
GOWN STRL REUS W/TWL 2XL LVL3 (GOWN DISPOSABLE) IMPLANT
GOWN STRL REUS W/TWL LRG LVL3 (GOWN DISPOSABLE)
GOWN STRL REUS W/TWL XL LVL3 (GOWN DISPOSABLE) ×4
KIT BASIN OR (CUSTOM PROCEDURE TRAY) ×3 IMPLANT
KIT ROOM TURNOVER OR (KITS) ×3 IMPLANT
LIQUID BAND (GAUZE/BANDAGES/DRESSINGS) IMPLANT
NEEDLE HYPO 22GX1.5 SAFETY (NEEDLE) ×3 IMPLANT
NS IRRIG 1000ML POUR BTL (IV SOLUTION) ×3 IMPLANT
PACK LAMINECTOMY NEURO (CUSTOM PROCEDURE TRAY) ×3 IMPLANT
PAD ARMBOARD 7.5X6 YLW CONV (MISCELLANEOUS) ×9 IMPLANT
PATTIES SURGICAL .75X.75 (GAUZE/BANDAGES/DRESSINGS) ×3 IMPLANT
PEDIGUARD CURV (INSTRUMENTS) ×3 IMPLANT
PUTTY DBM 5CC CALC GRAN ×3 IMPLANT
PUTTY GAMMA BSM 5CC (Putty) ×3 IMPLANT
ROD PATHFINDER 40MM (Rod) ×6 IMPLANT
SCREW POLYAXIA MIS 6.5X40MM (Screw) ×12 IMPLANT
SPONGE LAP 4X18 X RAY DECT (DISPOSABLE) IMPLANT
SPONGE SURGIFOAM ABS GEL 100 (HEMOSTASIS) ×3 IMPLANT
STIMULATOR SPF PLUS 60/M (Stimulator) ×3 IMPLANT
STRIP CLOSURE SKIN 1/2X4 (GAUZE/BANDAGES/DRESSINGS) ×4 IMPLANT
SUT PROLENE 0 CT 1 30 (SUTURE) ×3 IMPLANT
SUT VIC AB 0 CT1 18XCR BRD8 (SUTURE) ×2 IMPLANT
SUT VIC AB 0 CT1 8-18 (SUTURE) ×4
SUT VIC AB 2-0 OS6 18 (SUTURE) ×9 IMPLANT
SUT VIC AB 3-0 CP2 18 (SUTURE) ×6 IMPLANT
TAPE STRIPS DRAPE STRL (GAUZE/BANDAGES/DRESSINGS) ×3 IMPLANT
TOP CLSR SEQUOIA (Orthopedic Implant) ×12 IMPLANT
TOWEL OR 17X24 6PK STRL BLUE (TOWEL DISPOSABLE) ×3 IMPLANT
TOWEL OR 17X26 10 PK STRL BLUE (TOWEL DISPOSABLE) ×3 IMPLANT
TRAP SPECIMEN MUCOUS 40CC (MISCELLANEOUS) IMPLANT
TRAY FOLEY W/METER SILVER 14FR (SET/KITS/TRAYS/PACK) ×3 IMPLANT
WATER STERILE IRR 1000ML POUR (IV SOLUTION) ×3 IMPLANT

## 2015-05-31 NOTE — H&P (Signed)
Cheryl Randall is an 39 y.o. female.   Chief Complaint: Left leg pain HPI: The patient is a 39 year old female who had a back fusion many years ago for right-sided pain. She did well until early 2016 when she developed left-sided difficulty. She saw her medical doctor who gave her some injections without improvement. An MRI scan was done she was referred for evaluation. Taken some Neurontin which did not really give her much help. Her films were reviewed which showed some lateral recess stenosis at L4-5 on the left. She was tried on some epidural shots without improvement. She therefore underwent imaging study with a myelogram and post HCT. At L4-5 there appeared to be a pseudoarthrosis as well as bony overgrowth and nerve compression on the left. The options were discussed it was elected to bring her to the operating room at this time decompress the left side at L4-5-2 posterolateral fusion with internal bone growth stimulator and then put pedicle screws. I've had a long discussion with her regarding the risks and benefits of surgical intervention. The risks discussed include but are not limited to bleeding infection weakness numbness paralysis spinal fluid leakage trouble with instrumentation nonunion coma and death. We have discussed alternative methods of therapy along with the risks and benefits of nonintervention. She's had the opportunity to ask numerous questions and appears to understand. With this information in hand she has requested we proceed with surgery.  Past Medical History  Diagnosis Date  . DDD (degenerative disc disease)   . Migraine   . PONV (postoperative nausea and vomiting)   . Family history of adverse reaction to anesthesia     Patients son has bad N/V  . Anxiety   . Diabetes mellitus without complication ([place])     borderline; diet controlled    Past Surgical History  Procedure Laterality Date  . Cholecystectomy    . Back surgery      X 2  . Breast surgery Bilateral     reduction    Family History  Problem Relation Age of Onset  . Cancer Father     bladder   Social History:  reports that she has quit smoking. She has never used smokeless tobacco. She reports that she does not drink alcohol or use illicit drugs.  Allergies: No Known Allergies  Medications Prior to Admission  Medication Sig Dispense Refill  . acetaminophen (TYLENOL) 325 MG tablet Take 650 mg by mouth every 6 (six) hours as needed for mild pain.    Marland Kitchen ibuprofen (ADVIL,MOTRIN) 800 MG tablet Take 800 mg by mouth every 8 (eight) hours as needed for mild pain. As needed for pharmacy.    Marland Kitchen sertraline (ZOLOFT) 50 MG tablet Take 50 mg by mouth daily.  12    No results found for this or any previous visit (from the past 48 hour(s)). No results found.  Pertinent items are noted in HPI.  Blood pressure 139/89, pulse 83, temperature 98.1 F (36.7 C), temperature source Oral, resp. rate 18, last menstrual period 02/24/2013, SpO2 99 %.  The patient is awake alert and oriented. She is no facial asymmetry. 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 The plan is for a decompression with posterolateral fusion and pedicle screw fixation.  Faythe Ghee, MD 05/31/2015, 7:28 AM

## 2015-05-31 NOTE — Anesthesia Postprocedure Evaluation (Signed)
Anesthesia Post Note  Patient: Cheryl Randall  Procedure(s) Performed: Procedure(s) (LRB): Laminectomy and Foraminotomy - L4-L5 - left, L4-L5  posterolateral fusion; pathfinder screws, removal of transfacet screws - L4-L5 (N/A)  Patient location during evaluation: PACU Anesthesia Type: General Level of consciousness: awake and alert Pain management: pain level controlled Vital Signs Assessment: post-procedure vital signs reviewed and stable Respiratory status: spontaneous breathing and respiratory function stable Cardiovascular status: blood pressure returned to baseline and stable Postop Assessment: no signs of nausea or vomiting Anesthetic complications: no    Last Vitals:  Filed Vitals:   05/31/15 1145 05/31/15 1200  BP: 114/60 117/62  Pulse: 82 80  Temp:    Resp: 20 16    Last Pain:  Filed Vitals:   05/31/15 1211  PainSc: 6                  Avelyn Touch S

## 2015-05-31 NOTE — Transfer of Care (Signed)
Immediate Anesthesia Transfer of Care Note  Patient: Cheryl Randall  Procedure(s) Performed: Procedure(s) with comments: Laminectomy and Foraminotomy - L4-L5 - left, L4-L5  posterolateral fusion; pathfinder screws, removal of transfacet screws - L4-L5 (N/A) - Laminectomy and Foraminotomy - L4-L5 - left, L4-L5  posterolateral fusion; pathfinder screws, removal of transfacet screws - L4-L5  Patient Location: PACU  Anesthesia Type:General  Level of Consciousness: sedated and responds to stimulation  Airway & Oxygen Therapy: Patient Spontanous Breathing and Patient connected to nasal cannula oxygen  Post-op Assessment: Report given to RN and Post -op Vital signs reviewed and stable  Post vital signs: Reviewed and stable  Last Vitals:  Filed Vitals:   05/31/15 0555 05/31/15 1110  BP: 139/89   Pulse: 83   Temp: 36.7 C 36.9 C  Resp: 18     Complications: No apparent anesthesia complications

## 2015-05-31 NOTE — Op Note (Signed)
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 Localizing x-rays taken prior to incision to identify the appropriate levels. Midline incision was made above the spinous processes of L3-L4 and L5. Using Bovie cutting current the incision was carried on the spinous processes. Subperiosteal dissection was then carried out on the spinous processes and lamina to identify the lamina facet joint and far lateral region of L4 and L5 the transverse processes. Self-retaining tract was placed for exposure and x-ray showed approach the appropriate level. On the patient's left side generous laminotomy was performed by removing the inferior 80% of the L4 lamina the medial 80% of the facet joint the superior edge of the L5 lamina. The previous transfer set screws were removed bilaterally. We removed residual bone and hypertrophic ligament. There is a large spur coming off the facet joint causing marked compression of the L5 nerve root on the left side and this was removed to decompress the nerve root. We tracked the nerve root at its foramen until the decompression was complete. We then irrigated copiously controlled any bleeding with upper coagulation Gelfoam. We then placed pedicle screws in standard fashion. We used drill hole entry points then passed the ultrasonic guided pedicle all tapped with a 6 mm tap and then placed 6.5 x 40 mm screws bilaterally at L4 and L5. We then decorticated the far lateral region to posterolateral fusion with a mixture of  autologous bone and morselized allograft. We then chose appropriately length rods secured them to the top of the screws and did tightening and final tightening with torque and counter torque. We then placed a bone stimulator into lateral recess we placed our biologic material and then covered it with the same mixture of autologous bone morselized allograft. We then irrigated copiously controlled any bleeding with upper coagulation Gelfoam. We left an epidural drain in the epidural space and brought out through a separate stab wound incision. We then closed the wound in multiple layers of Vicryl on the muscle fascia subcutaneous and subcuticular tissues. A running locking Prolene was placed on the skin. Shortness was then applied the patient was extubated and taken to recovery room in stable condition.

## 2015-05-31 NOTE — Anesthesia Procedure Notes (Signed)
Procedure Name: Intubation Date/Time: 05/31/2015 8:02 AM Performed by: Merdis Delay Pre-anesthesia Checklist: Patient identified, Timeout performed, Emergency Drugs available, Suction available and Patient being monitored Patient Re-evaluated:Patient Re-evaluated prior to inductionOxygen Delivery Method: Circle system utilized Preoxygenation: Pre-oxygenation with 100% oxygen Intubation Type: IV induction Ventilation: Mask ventilation without difficulty Laryngoscope Size: Mac and 3 Grade View: Grade II Tube type: Oral Tube size: 7.5 mm Number of attempts: 1 Airway Equipment and Method: Stylet Placement Confirmation: ETT inserted through vocal cords under direct vision,  breath sounds checked- equal and bilateral,  positive ETCO2 and CO2 detector Secured at: 22 cm Tube secured with: Tape Dental Injury: Teeth and Oropharynx as per pre-operative assessment

## 2015-05-31 NOTE — Anesthesia Preprocedure Evaluation (Addendum)
Anesthesia Evaluation  Patient identified by MRN, date of birth, ID band Patient awake    Reviewed: Allergy & Precautions, NPO status , Patient's Chart, lab work & pertinent test results  Airway Mallampati: III  TM Distance: >3 FB Neck ROM: Full    Dental no notable dental hx. (+) Teeth Intact, Dental Advisory Given   Pulmonary neg pulmonary ROS, former smoker,    Pulmonary exam normal breath sounds clear to auscultation       Cardiovascular negative cardio ROS Normal cardiovascular exam Rhythm:Regular Rate:Normal     Neuro/Psych negative neurological ROS  negative psych ROS   GI/Hepatic negative GI ROS, Neg liver ROS,   Endo/Other  diabetesMorbid obesity  Renal/GU negative Renal ROS  negative genitourinary   Musculoskeletal negative musculoskeletal ROS (+)   Abdominal   Peds negative pediatric ROS (+)  Hematology negative hematology ROS (+)   Anesthesia Other Findings   Reproductive/Obstetrics negative OB ROS                            Anesthesia Physical Anesthesia Plan  ASA: III  Anesthesia Plan: General   Post-op Pain Management:    Induction: Intravenous  Airway Management Planned: Oral ETT  Additional Equipment:   Intra-op Plan:   Post-operative Plan: Extubation in OR  Informed Consent: I have reviewed the patients History and Physical, chart, labs and discussed the procedure including the risks, benefits and alternatives for the proposed anesthesia with the patient or authorized representative who has indicated his/her understanding and acceptance.   Dental advisory given  Plan Discussed with: CRNA and Surgeon  Anesthesia Plan Comments:         Anesthesia Quick Evaluation

## 2015-06-01 LAB — GLUCOSE, CAPILLARY: GLUCOSE-CAPILLARY: 112 mg/dL — AB (ref 65–99)

## 2015-06-01 MED ORDER — DIPHENHYDRAMINE HCL 25 MG PO CAPS
25.0000 mg | ORAL_CAPSULE | Freq: Four times a day (QID) | ORAL | Status: DC | PRN
  Administered 2015-06-01: 25 mg via ORAL
  Filled 2015-06-01: qty 1

## 2015-06-01 MED ORDER — HYDROCORTISONE 1 % EX OINT
TOPICAL_OINTMENT | Freq: Three times a day (TID) | CUTANEOUS | Status: DC
  Administered 2015-06-01 (×2): via TOPICAL
  Filled 2015-06-01: qty 28.35

## 2015-06-01 MED ORDER — HYDROXYZINE HCL 25 MG PO TABS
25.0000 mg | ORAL_TABLET | ORAL | Status: DC | PRN
  Administered 2015-06-01: 25 mg via ORAL
  Administered 2015-06-02: 50 mg via ORAL
  Administered 2015-06-02: 25 mg via ORAL
  Filled 2015-06-01: qty 1
  Filled 2015-06-01: qty 2
  Filled 2015-06-01: qty 1

## 2015-06-01 MED ORDER — HYDROCODONE-ACETAMINOPHEN 7.5-325 MG PO TABS
1.0000 | ORAL_TABLET | ORAL | Status: DC | PRN
  Administered 2015-06-01 (×2): 1 via ORAL
  Administered 2015-06-02 (×3): 2 via ORAL
  Filled 2015-06-01 (×2): qty 2
  Filled 2015-06-01: qty 1
  Filled 2015-06-01: qty 2
  Filled 2015-06-01: qty 1

## 2015-06-01 MED ORDER — SENNA 8.6 MG PO TABS
2.0000 | ORAL_TABLET | Freq: Every day | ORAL | Status: DC
  Administered 2015-06-01: 17.2 mg via ORAL
  Filled 2015-06-01: qty 2

## 2015-06-01 MED ORDER — PANTOPRAZOLE SODIUM 40 MG PO TBEC
40.0000 mg | DELAYED_RELEASE_TABLET | Freq: Every day | ORAL | Status: DC
  Administered 2015-06-01: 40 mg via ORAL
  Filled 2015-06-01: qty 1

## 2015-06-01 NOTE — Progress Notes (Signed)
Utilization review completed.  

## 2015-06-01 NOTE — Progress Notes (Signed)
Patient ID: Cheryl Randall, female   DOB: 1975/09/09, 39 y.o.   MRN: [id number] Afeb, vss No new neuro issues Leg pain completely resolved. Only appropriate incisional pain. Will advance activity, and d/c tomorrow.

## 2015-06-02 MED ORDER — HYDROCODONE-ACETAMINOPHEN 7.5-325 MG PO TABS: 1.0000 | ORAL_TABLET | ORAL | Status: DC | PRN

## 2015-06-02 MED ORDER — CYCLOBENZAPRINE HCL 10 MG PO TABS: 10.0000 mg | ORAL_TABLET | Freq: Three times a day (TID) | ORAL | Status: DC | PRN

## 2015-06-02 NOTE — Discharge Summary (Signed)
Physician Discharge Summary  Patient ID: Cheryl Randall MRN: [id number] DOB/AGE: [birth year] 39 y.o.  Admit date: 05/31/2015 Discharge date: 06/02/2015  Admission Diagnoses:  Discharge Diagnoses:  Active Problems:   Pseudoarthrosis of lumbar spine   Discharged Condition: good  Hospital Course: Patient was admitted the hospital where she underwent revision of her lumbar fusion without any complications. Postoperative she is doing well. Back and lower extremity pain well controlled. Patient mobilizing without difficulty. Ready for discharge home.  Consults:   Significant Diagnostic Studies:   Treatments:   Discharge Exam: Blood pressure 131/81, pulse 92, temperature 98.5 F (36.9 C), temperature source Oral, resp. rate 20, last menstrual period 02/24/2013, SpO2 93 %. Awake and alert. Oriented and appropriate. Cranial nerve function intact. Motor and sensory function extremities normal. Wound clean and dry. Chest and abdomen benign.  Disposition: 01-Home or Self Care     Medication List    TAKE these medications        acetaminophen 325 MG tablet  Commonly known as:  TYLENOL  Take 650 mg by mouth every 6 (six) hours as needed for mild pain.     cyclobenzaprine 10 MG tablet  Commonly known as:  FLEXERIL  Take 1 tablet (10 mg total) by mouth 3 (three) times daily as needed for muscle spasms.     HYDROcodone-acetaminophen 7.5-325 MG tablet  Commonly known as:  NORCO  Take 1-2 tablets by mouth every 4 (four) hours as needed for moderate pain.     ibuprofen 800 MG tablet  Commonly known as:  ADVIL,MOTRIN  Take 800 mg by mouth every 8 (eight) hours as needed for mild pain. As needed for pharmacy.     sertraline 50 MG tablet  Commonly known as:  ZOLOFT  Take 50 mg by mouth daily.           Follow-up Information    Follow up with Faythe Ghee, MD.   Specialty:  Neurosurgery   Contact information:   [address] [place]  [address] [phone]       Signed: Charlie Pitter 06/02/2015, 10:40 AM

## 2015-06-02 NOTE — Progress Notes (Addendum)
Patient alert and oriented, mae's well, voiding adequate amount of urine, swallowing without difficulty, c/o moderate pain and meds given prior to discharged. Patient discharged home with family. Dressing changed prior to discharged and extra dressing given for home use. Script and discharged instructions given to patient. Patient and family stated understanding of instructions given.

## 2015-06-30 ENCOUNTER — Inpatient Hospital Stay (HOSPITAL_COMMUNITY): Payer: Self-pay

## 2015-06-30 ENCOUNTER — Inpatient Hospital Stay (HOSPITAL_COMMUNITY)
Admission: EM | Admit: 2015-06-30 | Discharge: 2015-07-06 | DRG: 856 | Disposition: A | Discharge status: Home Health | Payer: Self-pay | Attending: Neurosurgery | Admitting: Neurosurgery

## 2015-06-30 ENCOUNTER — Encounter (HOSPITAL_COMMUNITY)
Admission: EM | Disposition: A | Discharge status: Home Health | Payer: Self-pay | Source: Home / Self Care | Attending: Neurosurgery

## 2015-06-30 ENCOUNTER — Inpatient Hospital Stay (HOSPITAL_COMMUNITY): Payer: Self-pay | Admitting: Anesthesiology

## 2015-06-30 ENCOUNTER — Encounter (HOSPITAL_COMMUNITY): Payer: Self-pay | Admitting: Emergency Medicine

## 2015-06-30 DIAGNOSIS — Z87891 Personal history of nicotine dependence: Secondary | ICD-10-CM

## 2015-06-30 DIAGNOSIS — M791 Myalgia, unspecified site: Secondary | ICD-10-CM

## 2015-06-30 DIAGNOSIS — L089 Local infection of the skin and subcutaneous tissue, unspecified: Secondary | ICD-10-CM

## 2015-06-30 DIAGNOSIS — T814XXA Infection following a procedure, initial encounter: Principal | ICD-10-CM | POA: Diagnosis present

## 2015-06-30 DIAGNOSIS — Z981 Arthrodesis status: Secondary | ICD-10-CM

## 2015-06-30 DIAGNOSIS — E119 Type 2 diabetes mellitus without complications: Secondary | ICD-10-CM | POA: Diagnosis present

## 2015-06-30 DIAGNOSIS — R000 Tachycardia, unspecified: Secondary | ICD-10-CM

## 2015-06-30 DIAGNOSIS — Z79899 Other long term (current) drug therapy: Secondary | ICD-10-CM

## 2015-06-30 DIAGNOSIS — D72829 Elevated white blood cell count, unspecified: Secondary | ICD-10-CM

## 2015-06-30 DIAGNOSIS — F419 Anxiety disorder, unspecified: Secondary | ICD-10-CM | POA: Diagnosis present

## 2015-06-30 DIAGNOSIS — M1990 Unspecified osteoarthritis, unspecified site: Secondary | ICD-10-CM | POA: Diagnosis present

## 2015-06-30 DIAGNOSIS — IMO0002 Reserved for concepts with insufficient information to code with codable children: Secondary | ICD-10-CM

## 2015-06-30 DIAGNOSIS — T8142XA Infection following a procedure, deep incisional surgical site, initial encounter: Secondary | ICD-10-CM | POA: Diagnosis present

## 2015-06-30 DIAGNOSIS — T148XXA Other injury of unspecified body region, initial encounter: Secondary | ICD-10-CM

## 2015-06-30 DIAGNOSIS — A4101 Sepsis due to Methicillin susceptible Staphylococcus aureus: Secondary | ICD-10-CM | POA: Diagnosis present

## 2015-06-30 HISTORY — PX: LUMBAR LAMINECTOMY/DECOMPRESSION MICRODISCECTOMY: SHX5026

## 2015-06-30 HISTORY — DX: Local infection of the skin and subcutaneous tissue, unspecified: L08.9

## 2015-06-30 LAB — URINALYSIS, ROUTINE W REFLEX MICROSCOPIC
GLUCOSE, UA: NEGATIVE mg/dL
Ketones, ur: 15 mg/dL — AB
LEUKOCYTES UA: NEGATIVE
NITRITE: NEGATIVE
PH: 5.5 (ref 5.0–8.0)
Protein, ur: 30 mg/dL — AB
SPECIFIC GRAVITY, URINE: 1.03 (ref 1.005–1.030)

## 2015-06-30 LAB — COMPREHENSIVE METABOLIC PANEL
ALT: 11 U/L — ABNORMAL LOW (ref 14–54)
ANION GAP: 10 (ref 5–15)
AST: 15 U/L (ref 15–41)
Albumin: 3.3 g/dL — ABNORMAL LOW (ref 3.5–5.0)
Alkaline Phosphatase: 48 U/L (ref 38–126)
BUN: 13 mg/dL (ref 6–20)
CHLORIDE: 100 mmol/L — AB (ref 101–111)
CO2: 25 mmol/L (ref 22–32)
Calcium: 9 mg/dL (ref 8.9–10.3)
Creatinine, Ser: 0.87 mg/dL (ref 0.44–1.00)
GFR calc Af Amer: >60 mL/min (ref >60)
Glucose, Bld: 184 mg/dL — ABNORMAL HIGH (ref 65–99)
POTASSIUM: 3.9 mmol/L (ref 3.5–5.1)
Sodium: 135 mmol/L (ref 135–145)
Total Bilirubin: 0.8 mg/dL (ref 0.3–1.2)
Total Protein: 6.5 g/dL (ref 6.5–8.1)

## 2015-06-30 LAB — CBC
HEMATOCRIT: 38.7 % (ref 36.0–46.0)
HEMOGLOBIN: 12.5 g/dL (ref 12.0–15.0)
MCH: 28.3 pg (ref 26.0–34.0)
MCHC: 32.3 g/dL (ref 30.0–36.0)
MCV: 87.6 fL (ref 78.0–100.0)
Platelets: 202 10*3/uL (ref 150–400)
RBC: 4.42 MIL/uL (ref 3.87–5.11)
RDW: 13 % (ref 11.5–15.5)
WBC: 20.3 10*3/uL — AB (ref 4.0–10.5)

## 2015-06-30 LAB — TYPE AND SCREEN
ABO/RH(D): O POS
ANTIBODY SCREEN: NEGATIVE

## 2015-06-30 LAB — URINE MICROSCOPIC-ADD ON

## 2015-06-30 LAB — LIPASE, BLOOD: LIPASE: 20 U/L (ref 11–51)

## 2015-06-30 LAB — INFLUENZA PANEL BY PCR (TYPE A & B)
H1N1 flu by pcr: NOT DETECTED
INFLAPCR: NEGATIVE
INFLBPCR: NEGATIVE

## 2015-06-30 LAB — C-REACTIVE PROTEIN: CRP: 17 mg/dL — AB (ref <1.0)

## 2015-06-30 LAB — I-STAT CG4 LACTIC ACID, ED: LACTIC ACID, VENOUS: 1.08 mmol/L (ref 0.5–2.0)

## 2015-06-30 LAB — SEDIMENTATION RATE: SED RATE: 40 mm/h — AB (ref 0–22)

## 2015-06-30 SURGERY — LUMBAR LAMINECTOMY/DECOMPRESSION MICRODISCECTOMY 1 LEVEL
Anesthesia: General | Site: Back

## 2015-06-30 MED ORDER — ROCURONIUM BROMIDE 50 MG/5ML IV SOLN
INTRAVENOUS | Status: AC
  Filled 2015-06-30: qty 2

## 2015-06-30 MED ORDER — VANCOMYCIN HCL 1000 MG IV SOLR
INTRAVENOUS | Status: AC
  Filled 2015-06-30: qty 2000

## 2015-06-30 MED ORDER — FENTANYL CITRATE (PF) 250 MCG/5ML IJ SOLN
INTRAMUSCULAR | Status: AC
  Filled 2015-06-30: qty 5

## 2015-06-30 MED ORDER — FENTANYL CITRATE (PF) 100 MCG/2ML IJ SOLN
50.0000 ug | INTRAMUSCULAR | Status: DC | PRN
  Administered 2015-06-30 (×2): 50 ug via INTRAVENOUS
  Filled 2015-06-30 (×2): qty 2

## 2015-06-30 MED ORDER — FENTANYL CITRATE (PF) 100 MCG/2ML IJ SOLN
50.0000 ug | Freq: Once | INTRAMUSCULAR | Status: AC
  Administered 2015-06-30: 50 ug via INTRAVENOUS
  Filled 2015-06-30: qty 2

## 2015-06-30 MED ORDER — MIDAZOLAM HCL 5 MG/5ML IJ SOLN
INTRAMUSCULAR | Status: DC | PRN
  Administered 2015-06-30 (×2): 1 mg via INTRAVENOUS

## 2015-06-30 MED ORDER — SUMATRIPTAN SUCCINATE 25 MG PO TABS
50.0000 mg | ORAL_TABLET | Freq: Two times a day (BID) | ORAL | Status: DC | PRN
Start: 2015-06-30 — End: 2015-07-06
  Administered 2015-07-04: 50 mg via ORAL
  Filled 2015-06-30 (×3): qty 1

## 2015-06-30 MED ORDER — ONDANSETRON HCL 4 MG/2ML IJ SOLN
INTRAMUSCULAR | Status: DC | PRN
  Administered 2015-06-30 – 2015-07-01 (×2): 4 mg via INTRAVENOUS

## 2015-06-30 MED ORDER — GLYCOPYRROLATE 0.2 MG/ML IJ SOLN
INTRAMUSCULAR | Status: AC
  Filled 2015-06-30: qty 3

## 2015-06-30 MED ORDER — VANCOMYCIN HCL IN DEXTROSE 1-5 GM/200ML-% IV SOLN
INTRAVENOUS | Status: AC
  Administered 2015-06-30: 1000 mg via INTRAVENOUS
  Filled 2015-06-30: qty 200

## 2015-06-30 MED ORDER — PIPERACILLIN-TAZOBACTAM 3.375 G IVPB 30 MIN
3.3750 g | Freq: Once | INTRAVENOUS | Status: AC
  Administered 2015-06-30: 3.375 g via INTRAVENOUS
  Filled 2015-06-30: qty 50

## 2015-06-30 MED ORDER — PHENYLEPHRINE 40 MCG/ML (10ML) SYRINGE FOR IV PUSH (FOR BLOOD PRESSURE SUPPORT)
PREFILLED_SYRINGE | INTRAVENOUS | Status: AC
  Filled 2015-06-30: qty 10

## 2015-06-30 MED ORDER — LIDOCAINE HCL (CARDIAC) 20 MG/ML IV SOLN
INTRAVENOUS | Status: AC
  Filled 2015-06-30: qty 5

## 2015-06-30 MED ORDER — PHENYLEPHRINE HCL 10 MG/ML IJ SOLN
INTRAMUSCULAR | Status: DC | PRN
  Administered 2015-06-30 (×3): 40 ug via INTRAVENOUS

## 2015-06-30 MED ORDER — SUCCINYLCHOLINE CHLORIDE 20 MG/ML IJ SOLN
INTRAMUSCULAR | Status: DC | PRN
  Administered 2015-06-30: 100 mg via INTRAVENOUS

## 2015-06-30 MED ORDER — MIDAZOLAM HCL 2 MG/2ML IJ SOLN
INTRAMUSCULAR | Status: AC
  Filled 2015-06-30: qty 2

## 2015-06-30 MED ORDER — LIDOCAINE HCL (CARDIAC) 20 MG/ML IV SOLN
INTRAVENOUS | Status: DC | PRN
  Administered 2015-06-30: 50 mg via INTRATRACHEAL

## 2015-06-30 MED ORDER — FENTANYL CITRATE (PF) 250 MCG/5ML IJ SOLN
INTRAMUSCULAR | Status: DC | PRN
  Administered 2015-06-30 (×2): 50 ug via INTRAVENOUS

## 2015-06-30 MED ORDER — EPHEDRINE SULFATE 50 MG/ML IJ SOLN
INTRAMUSCULAR | Status: AC
  Filled 2015-06-30: qty 1

## 2015-06-30 MED ORDER — LACTATED RINGERS IV SOLN
INTRAVENOUS | Status: DC | PRN
  Administered 2015-06-30 – 2015-07-01 (×2): via INTRAVENOUS

## 2015-06-30 MED ORDER — ONDANSETRON HCL 4 MG/2ML IJ SOLN: 4.0000 mg | Freq: Once | INTRAMUSCULAR | Status: DC

## 2015-06-30 MED ORDER — SODIUM CHLORIDE 0.9 % IJ SOLN
INTRAMUSCULAR | Status: AC
  Filled 2015-06-30: qty 10

## 2015-06-30 MED ORDER — ACETAMINOPHEN 10 MG/ML IV SOLN
INTRAVENOUS | Status: AC
Start: 2015-06-30 — End: 2015-06-30
  Administered 2015-06-30: 1000 mg via INTRAVENOUS
  Filled 2015-06-30: qty 100

## 2015-06-30 MED ORDER — SODIUM CHLORIDE 0.9 % IV BOLUS (SEPSIS)
30.0000 mL/kg | Freq: Once | INTRAVENOUS | Status: AC
  Administered 2015-06-30: 3501 mL via INTRAVENOUS

## 2015-06-30 MED ORDER — NEOSTIGMINE METHYLSULFATE 10 MG/10ML IV SOLN
INTRAVENOUS | Status: AC
  Filled 2015-06-30: qty 1

## 2015-06-30 MED ORDER — VANCOMYCIN HCL 1000 MG IV SOLR
INTRAVENOUS | Status: AC
  Filled 2015-06-30: qty 1000

## 2015-06-30 MED ORDER — ONDANSETRON 4 MG PO TBDP
4.0000 mg | ORAL_TABLET | Freq: Once | ORAL | Status: AC | PRN
  Administered 2015-06-30: 4 mg via ORAL

## 2015-06-30 MED ORDER — ONDANSETRON HCL 4 MG/2ML IJ SOLN
4.0000 mg | Freq: Once | INTRAMUSCULAR | Status: AC
  Administered 2015-06-30: 4 mg via INTRAVENOUS
  Filled 2015-06-30: qty 2

## 2015-06-30 MED ORDER — VANCOMYCIN HCL 1000 MG IV SOLR
INTRAVENOUS | Status: DC
  Filled 2015-06-30: qty 1000

## 2015-06-30 MED ORDER — ACETAMINOPHEN 325 MG PO TABS: 650.0000 mg | ORAL_TABLET | Freq: Four times a day (QID) | ORAL | Status: DC | PRN

## 2015-06-30 MED ORDER — SCOPOLAMINE 1 MG/3DAYS TD PT72
MEDICATED_PATCH | TRANSDERMAL | Status: AC
  Filled 2015-06-30: qty 1

## 2015-06-30 MED ORDER — VANCOMYCIN HCL IN DEXTROSE 1-5 GM/200ML-% IV SOLN
1000.0000 mg | Freq: Once | INTRAVENOUS | Status: AC
  Administered 2015-06-30: 1000 mg via INTRAVENOUS
  Filled 2015-06-30: qty 200

## 2015-06-30 MED ORDER — ROCURONIUM BROMIDE 100 MG/10ML IV SOLN
INTRAVENOUS | Status: DC | PRN
  Administered 2015-06-30: 40 mg via INTRAVENOUS

## 2015-06-30 MED ORDER — CYCLOBENZAPRINE HCL 10 MG PO TABS
10.0000 mg | ORAL_TABLET | Freq: Three times a day (TID) | ORAL | Status: DC | PRN
  Administered 2015-07-01 – 2015-07-04 (×5): 10 mg via ORAL
  Filled 2015-06-30 (×5): qty 1

## 2015-06-30 MED ORDER — PROPOFOL 10 MG/ML IV BOLUS
INTRAVENOUS | Status: DC | PRN
  Administered 2015-06-30: 150 mg via INTRAVENOUS

## 2015-06-30 MED ORDER — IOHEXOL 300 MG/ML  SOLN
100.0000 mL | Freq: Once | INTRAMUSCULAR | Status: AC | PRN
  Administered 2015-06-30: 100 mL via INTRAVENOUS

## 2015-06-30 MED ORDER — ONDANSETRON HCL 4 MG/2ML IJ SOLN
INTRAMUSCULAR | Status: AC
  Filled 2015-06-30: qty 2

## 2015-06-30 MED ORDER — ARTIFICIAL TEARS OP OINT
TOPICAL_OINTMENT | OPHTHALMIC | Status: AC
  Filled 2015-06-30: qty 3.5

## 2015-06-30 MED ORDER — STERILE WATER FOR INJECTION IJ SOLN
INTRAMUSCULAR | Status: AC
  Filled 2015-06-30: qty 10

## 2015-06-30 MED ORDER — SUCCINYLCHOLINE CHLORIDE 20 MG/ML IJ SOLN
INTRAMUSCULAR | Status: AC
  Filled 2015-06-30: qty 1

## 2015-06-30 MED ORDER — ONDANSETRON 4 MG PO TBDP
ORAL_TABLET | ORAL | Status: AC
  Filled 2015-06-30: qty 1

## 2015-06-30 MED ORDER — SCOPOLAMINE 1 MG/3DAYS TD PT72
MEDICATED_PATCH | TRANSDERMAL | Status: DC | PRN
  Administered 2015-06-30: 1 via TRANSDERMAL

## 2015-06-30 MED ORDER — HYDROCODONE-ACETAMINOPHEN 7.5-325 MG PO TABS: 1.0000 | ORAL_TABLET | ORAL | Status: DC | PRN

## 2015-06-30 SURGICAL SUPPLY — 58 items
BENZOIN TINCTURE PRP APPL 2/3 (GAUZE/BANDAGES/DRESSINGS) IMPLANT
BIT DRILL NEURO 2X3.1 SFT TUCH (MISCELLANEOUS) IMPLANT
BLADE CLIPPER SURG (BLADE) IMPLANT
BLADE SURG 11 STRL SS (BLADE) IMPLANT
BUR ROUND FLUTED 5 RND (BURR) IMPLANT
BUR ROUND FLUTED 5MM RND (BURR)
CANISTER SUCT 3000ML PPV (MISCELLANEOUS) ×6 IMPLANT
CHLORAPREP W/TINT 26ML (MISCELLANEOUS) IMPLANT
CLOSURE WOUND 1/2 X4 (GAUZE/BANDAGES/DRESSINGS)
DECANTER SPIKE VIAL GLASS SM (MISCELLANEOUS) ×3 IMPLANT
DERMABOND ADVANCED (GAUZE/BANDAGES/DRESSINGS)
DERMABOND ADVANCED .7 DNX12 (GAUZE/BANDAGES/DRESSINGS) IMPLANT
DRAPE MICROSCOPE LEICA (MISCELLANEOUS) IMPLANT
DRAPE POUCH INSTRU U-SHP 10X18 (DRAPES) IMPLANT
DRAPE SURG 17X23 STRL (DRAPES) IMPLANT
DRILL NEURO 2X3.1 SOFT TOUCH (MISCELLANEOUS)
DRSG VAC ATS MED SENSATRAC (GAUZE/BANDAGES/DRESSINGS) ×3 IMPLANT
ELECT REM PT RETURN 9FT ADLT (ELECTROSURGICAL) ×3
ELECTRODE REM PT RTRN 9FT ADLT (ELECTROSURGICAL) ×1 IMPLANT
GAUZE SPONGE 4X4 12PLY STRL (GAUZE/BANDAGES/DRESSINGS) IMPLANT
GAUZE SPONGE 4X4 16PLY XRAY LF (GAUZE/BANDAGES/DRESSINGS) IMPLANT
GLOVE BIOGEL PI IND STRL 7.5 (GLOVE) ×1 IMPLANT
GLOVE BIOGEL PI INDICATOR 7.5 (GLOVE) ×2
GLOVE EXAM NITRILE LRG STRL (GLOVE) IMPLANT
GLOVE EXAM NITRILE MD LF STRL (GLOVE) IMPLANT
GLOVE EXAM NITRILE XL STR (GLOVE) IMPLANT
GLOVE EXAM NITRILE XS STR PU (GLOVE) IMPLANT
GLOVE INDICATOR 7.5 STRL GRN (GLOVE) ×3 IMPLANT
GLOVE SS BIOGEL STRL SZ 7 (GLOVE) IMPLANT
GLOVE SUPERSENSE BIOGEL SZ 7 (GLOVE)
GLOVE SURG SS PI 7.0 STRL IVOR (GLOVE) ×3 IMPLANT
GLOVE SURG SS PI 7.5 STRL IVOR (GLOVE) ×3 IMPLANT
GOWN STRL REUS W/ TWL LRG LVL3 (GOWN DISPOSABLE) ×2 IMPLANT
GOWN STRL REUS W/ TWL XL LVL3 (GOWN DISPOSABLE) IMPLANT
GOWN STRL REUS W/TWL LRG LVL3 (GOWN DISPOSABLE) ×4
GOWN STRL REUS W/TWL XL LVL3 (GOWN DISPOSABLE)
HEMOSTAT POWDER KIT SURGIFOAM (HEMOSTASIS) IMPLANT
KIT BASIN OR (CUSTOM PROCEDURE TRAY) ×3 IMPLANT
KIT ROOM TURNOVER OR (KITS) ×3 IMPLANT
NEEDLE HYPO 25X1 1.5 SAFETY (NEEDLE) IMPLANT
NS IRRIG 1000ML POUR BTL (IV SOLUTION) ×3 IMPLANT
PACK LAMINECTOMY NEURO (CUSTOM PROCEDURE TRAY) ×3 IMPLANT
PACK UNIVERSAL I (CUSTOM PROCEDURE TRAY) IMPLANT
PAD ARMBOARD 7.5X6 YLW CONV (MISCELLANEOUS) ×9 IMPLANT
PATTIES SURGICAL .5X1.5 (GAUZE/BANDAGES/DRESSINGS) IMPLANT
RUBBERBAND STERILE (MISCELLANEOUS) IMPLANT
SPONGE SURGIFOAM ABS GEL SZ50 (HEMOSTASIS) IMPLANT
STRIP CLOSURE SKIN 1/2X4 (GAUZE/BANDAGES/DRESSINGS) IMPLANT
SUT PROLENE 0 CT 1 30 (SUTURE) ×3 IMPLANT
SUT VIC AB 0 CT1 18XCR BRD8 (SUTURE) ×1 IMPLANT
SUT VIC AB 0 CT1 8-18 (SUTURE) ×2
SUT VIC AB 2-0 CT1 18 (SUTURE) IMPLANT
SUT VIC AB 3-0 SH 8-18 (SUTURE) IMPLANT
TOWEL OR 17X24 6PK STRL BLUE (TOWEL DISPOSABLE) ×3 IMPLANT
TOWEL OR 17X26 10 PK STRL BLUE (TOWEL DISPOSABLE) ×3 IMPLANT
TUBE CONNECTING 12'X1/4 (SUCTIONS) ×1
TUBE CONNECTING 12X1/4 (SUCTIONS) ×2 IMPLANT
WATER STERILE IRR 1000ML POUR (IV SOLUTION) ×3 IMPLANT

## 2015-06-30 NOTE — ED Notes (Addendum)
Pt came from urgent care for further eval of possible blood infection. Pt had back surgery 05/30/2015. Pt reports generalized pain, chills, nausea and not being able to eat. Pt reports that surgical wound began draining yellow discharge yesterday.

## 2015-06-30 NOTE — Progress Notes (Signed)
Pt arrived on unit 1940 hrs, A&O, C/O headache, present all day, warm to touch temp 102.8 oral. Wound is reddened no obvious drainage noted. Pt NPO, CHG bath completed, pt oriented to room, admission orders implemented. Awaiting surgical procedure scheduled this PM

## 2015-06-30 NOTE — Anesthesia Procedure Notes (Signed)
Procedure Name: Intubation Date/Time: 06/30/2015 11:32 PM Performed by: Maude Leriche D Pre-anesthesia Checklist: Patient identified, Emergency Drugs available, Suction available, Patient being monitored and Timeout performed Patient Re-evaluated:Patient Re-evaluated prior to inductionOxygen Delivery Method: Circle system utilized Preoxygenation: Pre-oxygenation with 100% oxygen Intubation Type: IV induction, Rapid sequence and Cricoid Pressure applied Laryngoscope Size: Miller and 2 Grade View: Grade I Tube type: Oral Tube size: 7.0 mm Number of attempts: 1 Airway Equipment and Method: Stylet Placement Confirmation: ETT inserted through vocal cords under direct vision,  positive ETCO2 and breath sounds checked- equal and bilateral Secured at: 22 cm Tube secured with: Tape Dental Injury: Teeth and Oropharynx as per pre-operative assessment

## 2015-06-30 NOTE — Anesthesia Preprocedure Evaluation (Addendum)
Anesthesia Evaluation  Patient identified by MRN, date of birth, ID band Patient awake    Reviewed: Allergy & Precautions, NPO status   History of Anesthesia Complications (+) PONV and history of anesthetic complications  Airway Mallampati: II  TM Distance: >3 FB Neck ROM: Full    Dental  (+) Teeth Intact, Dental Advisory Given   Pulmonary former smoker,    breath sounds clear to auscultation       Cardiovascular negative cardio ROS   Rhythm:Regular Rate:Tachycardia     Neuro/Psych  Headaches, PSYCHIATRIC DISORDERS Anxiety    GI/Hepatic negative GI ROS, Neg liver ROS,   Endo/Other  diabetes  Renal/GU negative Renal ROS  negative genitourinary   Musculoskeletal  (+) Arthritis , Osteoarthritis,    Abdominal   Peds negative pediatric ROS (+)  Hematology negative hematology ROS (+)   Anesthesia Other Findings   Reproductive/Obstetrics negative OB ROS                           Lab Results  Component Value Date   WBC 20.3* 06/30/2015   HGB 12.5 06/30/2015   HCT 38.7 06/30/2015   MCV 87.6 06/30/2015   PLT 202 06/30/2015   Lab Results  Component Value Date   CREATININE 0.87 06/30/2015   BUN 13 06/30/2015   NA 135 06/30/2015   K 3.9 06/30/2015   CL 100* 06/30/2015   CO2 25 06/30/2015   No results found for: INR, PROTIME   Anesthesia Physical Anesthesia Plan  ASA: III  Anesthesia Plan: General   Post-op Pain Management:    Induction: Intravenous  Airway Management Planned: Oral ETT  Additional Equipment:   Intra-op Plan:   Post-operative Plan: Extubation in OR  Informed Consent: I have reviewed the patients History and Physical, chart, labs and discussed the procedure including the risks, benefits and alternatives for the proposed anesthesia with the patient or authorized representative who has indicated his/her understanding and acceptance.   Dental advisory  given  Plan Discussed with: CRNA, Anesthesiologist and Surgeon  Anesthesia Plan Comments:        Anesthesia Quick Evaluation

## 2015-06-30 NOTE — H&P (Signed)
CC:  Chief Complaint  Patient presents with  . Blood Infection  . Chills  . Generalized Body Aches  . Wound Infection    HPI: Cheryl Randall is a 40 y.o. 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  Presents with one day of fever, chills, worsening back pain, wound drainage started yesterday.  Reports drainage was yellow and purulent.  PMH: Past Medical History  Diagnosis Date  . DDD (degenerative disc disease)   . Migraine   . PONV (postoperative nausea and vomiting)   . Family history of adverse reaction to anesthesia     Patients son has bad N/V  . Anxiety   . Diabetes mellitus without complication ([place])     borderline; diet controlled    PSH: Past Surgical History  Procedure Laterality Date  . Cholecystectomy    . Back surgery      X 2  . Breast surgery Bilateral     reduction    SH: Social History  Substance Use Topics  . Smoking status: Former Research scientist (life sciences)  . Smokeless tobacco: Never Used  . Alcohol Use: No    MEDS: Prior to Admission medications   Medication Sig Start Date End Date Taking? Authorizing Provider  acetaminophen (TYLENOL) 325 MG tablet Take 650 mg by mouth every 6 (six) hours as needed for mild pain.   Yes Historical Provider, MD  cyclobenzaprine (FLEXERIL) 10 MG tablet Take 1 tablet (10 mg total) by mouth 3 (three) times daily as needed for muscle spasms. 06/02/15  Yes Earnie Larsson, MD  HYDROcodone-acetaminophen (NORCO) 7.5-325 MG tablet Take 1-2 tablets by mouth every 4 (four) hours as needed for moderate pain. 06/02/15  Yes Earnie Larsson, MD  SUMAtriptan (IMITREX) 50 MG tablet Take 50 mg by mouth 2 (two) times daily as needed. Repeat in 4 hours if no relief 05/13/15  Yes Historical Provider, MD    ALLERGY: Allergies  Allergen Reactions  . Codeine Itching and Rash  . Latex Itching and Rash  . Norco [Hydrocodone-Acetaminophen] Itching and Rash  . Tape Itching and Rash    ROS: ROS  NEUROLOGIC EXAM: Awake, alert,  oriented Memory and concentration grossly intact Speech fluent, appropriate CN grossly intact Motor exam: Upper Extremities Deltoid Bicep Tricep Grip  Right 5/5 5/5 5/5 5/5  Left 5/5 5/5 5/5 5/5   Lower Extremity IP Quad PF DF EHL  Right 5/5 5/5 5/5 5/5 5/5  Left 5/5 5/5 5/5 5/5 5/5   Sensation grossly intact to LT Incision with dehiscence about 1/3 of way from superior edge of incision.  Purulent drainage easily expressed.  Tenderness to palpation around wound.  IMAGING: Pending  IMPRESSION: - 40 y.o. female with surgical site infection.  Marked leukocytosis.  Tachycardic.  Afebrile here but with fevers at home.  PLAN: - Wound exploration, irrigation, debridement, explantation of bone growth stimulator. - PICC line - Hold abx until cultures obtained.

## 2015-06-30 NOTE — ED Provider Notes (Signed)
CSN: [id number]     Arrival date & time 06/30/15  1232 History   First MD Initiated Contact with Patient 06/30/15 1325     Chief Complaint  Patient presents with  . Blood Infection  . Chills  . Generalized Body Aches  . Wound Infection     (Consider location/radiation/quality/duration/timing/severity/associated sxs/prior Treatment) HPI Patient reports that she quite suddenly became ill yesterday. She reports she felt fine prior to onset of symptoms. She developed chills and nausea. She states that her surgical wound from her lumbar surgery done approximately 3 weeks ago started to drain a copious amount of yellow discharge. She states the wound had been healed up and she was not having problems with pain. Now she reports she has a deep aching pain throughout her legs. He denies cough or shortness of breath. She reports she does have some headache. No confusion or visual changes. She was seen at urgent care and referred to the emergency department for concerns of bacteremia. Past Medical History  Diagnosis Date  . DDD (degenerative disc disease)   . Migraine   . PONV (postoperative nausea and vomiting)   . Family history of adverse reaction to anesthesia     Patients son has bad N/V  . Anxiety   . Diabetes mellitus without complication ([place])     borderline; diet controlled   Past Surgical History  Procedure Laterality Date  . Cholecystectomy    . Back surgery      X 2  . Breast surgery Bilateral     reduction   Family History  Problem Relation Age of Onset  . Cancer Father     bladder   Social History  Substance Use Topics  . Smoking status: Former Research scientist (life sciences)  . Smokeless tobacco: Never Used  . Alcohol Use: No   OB History    No data available     Review of Systems 10 Systems reviewed and are negative for acute change except as noted in the HPI.    Allergies  Codeine; Latex; Norco; and Tape  Home Medications   Prior to Admission medications   Medication Sig  Start Date End Date Taking? Authorizing Provider  acetaminophen (TYLENOL) 325 MG tablet Take 650 mg by mouth every 6 (six) hours as needed for mild pain.   Yes Historical Provider, MD  cyclobenzaprine (FLEXERIL) 10 MG tablet Take 1 tablet (10 mg total) by mouth 3 (three) times daily as needed for muscle spasms. 06/02/15  Yes Earnie Larsson, MD  HYDROcodone-acetaminophen (NORCO) 7.5-325 MG tablet Take 1-2 tablets by mouth every 4 (four) hours as needed for moderate pain. 06/02/15  Yes Earnie Larsson, MD  ibuprofen (ADVIL,MOTRIN) 800 MG tablet Take 800 mg by mouth every 8 (eight) hours as needed for mild pain. As needed for pharmacy.   Yes Historical Provider, MD  SUMAtriptan (IMITREX) 50 MG tablet Take 50 mg by mouth 2 (two) times daily as needed. Repeat in 4 hours if no relief 05/13/15  Yes Historical Provider, MD   BP 117/76 mmHg  Pulse 124  Temp(Src) 98 F (36.7 C) (Oral)  Resp 20  Wt 257 lb 5 oz (116.716 kg)  SpO2 82%  LMP 02/24/2013 Physical Exam  Constitutional: She is oriented to person, place, and time. She appears well-developed and well-nourished.  Patient appears mildly uncomfortable but is appropriate and nontoxic. Status is clear. No respiratory distress.  HENT:  Head: Normocephalic and atraumatic.  Right Ear: External ear normal.  Left Ear: External ear normal.  Nose:  Nose normal.  Mouth/Throat: Oropharynx is clear and moist.  Eyes: EOM are normal. Pupils are equal, round, and reactive to light. Right eye exhibits no discharge. Left eye exhibits no discharge. No scleral icterus.  Neck: Neck supple.  Cardiovascular: Regular rhythm, normal heart sounds and intact distal pulses.   Tachycardia  Pulmonary/Chest: Effort normal and breath sounds normal. No respiratory distress.  Abdominal: Soft. Bowel sounds are normal. She exhibits no distension. There is no tenderness.  Musculoskeletal: Normal range of motion. She exhibits no edema.  Incision on the patient's lower back has  predominantly healed with pink scar tissue from sutures. At approximately the middle of the linear portion of the wound, there is a 5 mm opening that has pale yellow purulent material draining. There is not significant surrounding erythema or cellulitis. Patient is able to lean forward and sit back in the stretcher. He does report some increased pain with forward flexion.  Neurological: She is alert and oriented to person, place, and time. She has normal strength. No cranial nerve deficit. She exhibits normal muscle tone. Coordination normal. GCS eye subscore is 4. GCS verbal subscore is 5. GCS motor subscore is 6.  Skin: Skin is warm, dry and intact.  Psychiatric: She has a normal mood and affect.    ED Course  Procedures (including critical care time) CRITICAL CARE Performed by: Charlesetta Shanks   Total critical care time: 30 minutes  Critical care time was exclusive of separately billable procedures and treating other patients.  Critical care was necessary to treat or prevent imminent or life-threatening deterioration.  Critical care was time spent personally by me on the following activities: development of treatment plan with patient and/or surrogate as well as nursing, discussions with consultants, evaluation of patient's response to treatment, examination of patient, obtaining history from patient or surrogate, ordering and performing treatments and interventions, ordering and review of laboratory studies, ordering and review of radiographic studies, pulse oximetry and re-evaluation of patient's condition. Labs Review Labs Reviewed  COMPREHENSIVE METABOLIC PANEL - Abnormal; Notable for the following:    Chloride 100 (*)    Glucose, Bld 184 (*)    Albumin 3.3 (*)    ALT 11 (*)    All other components within normal limits  CBC - Abnormal; Notable for the following:    WBC 20.3 (*)    All other components within normal limits  URINALYSIS, ROUTINE W REFLEX MICROSCOPIC (NOT AT [facility]) -  Abnormal; Notable for the following:    Color, Urine AMBER (*)    APPearance HAZY (*)    Hgb urine dipstick SMALL (*)    Bilirubin Urine SMALL (*)    Ketones, ur 15 (*)    Protein, ur 30 (*)    All other components within normal limits  URINE MICROSCOPIC-ADD ON - Abnormal; Notable for the following:    Squamous Epithelial / LPF 6-30 (*)    Bacteria, UA FEW (*)    All other components within normal limits  URINE CULTURE  CULTURE, BLOOD (ROUTINE X 2)  CULTURE, BLOOD (ROUTINE X 2)  WOUND CULTURE  LIPASE, BLOOD  INFLUENZA PANEL BY PCR (TYPE A & B, H1N1)  I-STAT CG4 LACTIC ACID, ED    Imaging Review No results found. I have personally reviewed and evaluated these images and lab results as part of my medical decision-making.   EKG Interpretation None     Consult (15:39)Patient's case was reviewed with Dr.Ditty, he requests MRI with and without contrast of the lumbar spine  and then results.  A recheck at 15:47. Pain is well controlled with fentanyl. Patient has oxygen saturation of 100% with good waveform. She is not on any supplemental oxygen. Her rate is 114 and blood pressure is 114/75. The status is clear patient has no respiratory distress color is good. Patient has been informed of plans for MRI. MDM   Final diagnoses:  Wound abscess  Leukocytosis  Tachycardia  Myalgia   At this time there is concern for bacteremia or early sepsis. The patient presents to tachycardia and fever reported from yesterday. He does have significant leukocytosis. Suspect the source is surgical wound. Other consideration is for other interim illness, viral versus bacterial. Empiric antibiotics have been initiated and fluid resuscitation administered. Dr.Ditty will evaluate the patient in the emergency department.   Charlesetta Shanks, MD 06/30/15 [id number]

## 2015-07-01 DIAGNOSIS — Y838 Other surgical procedures as the cause of abnormal reaction of the patient, or of later complication, without mention of misadventure at the time of the procedure: Secondary | ICD-10-CM

## 2015-07-01 DIAGNOSIS — T814XXA Infection following a procedure, initial encounter: Principal | ICD-10-CM

## 2015-07-01 DIAGNOSIS — B9689 Other specified bacterial agents as the cause of diseases classified elsewhere: Secondary | ICD-10-CM

## 2015-07-01 DIAGNOSIS — T8142XA Infection following a procedure, deep incisional surgical site, initial encounter: Secondary | ICD-10-CM

## 2015-07-01 DIAGNOSIS — S31000A Unspecified open wound of lower back and pelvis without penetration into retroperitoneum, initial encounter: Secondary | ICD-10-CM

## 2015-07-01 DIAGNOSIS — Z981 Arthrodesis status: Secondary | ICD-10-CM

## 2015-07-01 HISTORY — DX: Infection following a procedure, deep incisional surgical site, initial encounter: T81.42XA

## 2015-07-01 LAB — CBC WITH DIFFERENTIAL/PLATELET
Basophils Absolute: 0 10*3/uL (ref 0.0–0.1)
Basophils Relative: 0 %
EOS PCT: 0 %
Eosinophils Absolute: 0 10*3/uL (ref 0.0–0.7)
HEMATOCRIT: 31.4 % — AB (ref 36.0–46.0)
HEMOGLOBIN: 10.3 g/dL — AB (ref 12.0–15.0)
LYMPHS ABS: 0.3 10*3/uL — AB (ref 0.7–4.0)
LYMPHS PCT: 3 %
MCH: 28.9 pg (ref 26.0–34.0)
MCHC: 32.8 g/dL (ref 30.0–36.0)
MCV: 88.2 fL (ref 78.0–100.0)
Monocytes Absolute: 0.7 10*3/uL (ref 0.1–1.0)
Monocytes Relative: 7 %
NEUTROS ABS: 9.9 10*3/uL — AB (ref 1.7–7.7)
NEUTROS PCT: 90 %
PLATELETS: 157 10*3/uL (ref 150–400)
RBC: 3.56 MIL/uL — AB (ref 3.87–5.11)
RDW: 13.2 % (ref 11.5–15.5)
WBC: 11 10*3/uL — ABNORMAL HIGH (ref 4.0–10.5)

## 2015-07-01 LAB — GLUCOSE, CAPILLARY
GLUCOSE-CAPILLARY: 154 mg/dL — AB (ref 65–99)
GLUCOSE-CAPILLARY: 136 mg/dL — AB (ref 65–99)
GLUCOSE-CAPILLARY: 123 mg/dL — AB (ref 65–99)
Glucose-Capillary: 158 mg/dL — ABNORMAL HIGH (ref 65–99)
Glucose-Capillary: 135 mg/dL — ABNORMAL HIGH (ref 65–99)

## 2015-07-01 LAB — URINE CULTURE: SPECIAL REQUESTS: NORMAL

## 2015-07-01 LAB — GRAM STAIN: GRAM STAIN: NONE SEEN

## 2015-07-01 LAB — BASIC METABOLIC PANEL
Anion gap: 4 — ABNORMAL LOW (ref 5–15)
BUN: 9 mg/dL (ref 6–20)
CHLORIDE: 104 mmol/L (ref 101–111)
CO2: 26 mmol/L (ref 22–32)
CREATININE: 0.8 mg/dL (ref 0.44–1.00)
Calcium: 7.9 mg/dL — ABNORMAL LOW (ref 8.9–10.3)
Glucose, Bld: 162 mg/dL — ABNORMAL HIGH (ref 65–99)
POTASSIUM: 3.8 mmol/L (ref 3.5–5.1)
SODIUM: 134 mmol/L — AB (ref 135–145)

## 2015-07-01 MED ORDER — ONDANSETRON HCL 4 MG/2ML IJ SOLN
4.0000 mg | Freq: Four times a day (QID) | INTRAMUSCULAR | Status: DC | PRN
  Filled 2015-07-01 (×2): qty 2

## 2015-07-01 MED ORDER — MEPERIDINE HCL 25 MG/ML IJ SOLN: 6.2500 mg | INTRAMUSCULAR | Status: DC | PRN

## 2015-07-01 MED ORDER — HYDROMORPHONE HCL 1 MG/ML IJ SOLN
0.2500 mg | INTRAMUSCULAR | Status: DC | PRN
  Administered 2015-07-01 (×4): 0.5 mg via INTRAVENOUS

## 2015-07-01 MED ORDER — VANCOMYCIN HCL 1000 MG IV SOLR
INTRAVENOUS | Status: DC | PRN
  Administered 2015-07-01: 1000 mg

## 2015-07-01 MED ORDER — MENTHOL 3 MG MT LOZG
1.0000 | LOZENGE | OROMUCOSAL | Status: DC | PRN
  Administered 2015-07-05: 3 mg via ORAL
  Filled 2015-07-01: qty 9

## 2015-07-01 MED ORDER — GLYCOPYRROLATE 0.2 MG/ML IJ SOLN
INTRAMUSCULAR | Status: DC | PRN
  Administered 2015-07-01: 0.6 mg via INTRAVENOUS

## 2015-07-01 MED ORDER — SODIUM CHLORIDE 0.9% FLUSH: 9.0000 mL | INTRAVENOUS | Status: DC | PRN

## 2015-07-01 MED ORDER — VANCOMYCIN HCL IN DEXTROSE 1-5 GM/200ML-% IV SOLN
1000.0000 mg | Freq: Three times a day (TID) | INTRAVENOUS | Status: DC
  Administered 2015-07-01 – 2015-07-03 (×6): 1000 mg via INTRAVENOUS
  Filled 2015-07-01 (×9): qty 200

## 2015-07-01 MED ORDER — HYDROMORPHONE HCL 1 MG/ML IJ SOLN
INTRAMUSCULAR | Status: AC
  Filled 2015-07-01: qty 1

## 2015-07-01 MED ORDER — SODIUM CHLORIDE 0.9% FLUSH
3.0000 mL | INTRAVENOUS | Status: DC | PRN
  Administered 2015-07-03: 3 mL via INTRAVENOUS
  Filled 2015-07-01: qty 3

## 2015-07-01 MED ORDER — HYDROMORPHONE 1 MG/ML IV SOLN
INTRAVENOUS | Status: DC
  Administered 2015-07-01: 0 mg via INTRAVENOUS
  Administered 2015-07-01: 0.3 mg via INTRAVENOUS

## 2015-07-01 MED ORDER — DIPHENHYDRAMINE HCL 50 MG/ML IJ SOLN
12.5000 mg | Freq: Four times a day (QID) | INTRAMUSCULAR | Status: DC | PRN
  Filled 2015-07-01: qty 0.25

## 2015-07-01 MED ORDER — HYDROMORPHONE 1 MG/ML IV SOLN
INTRAVENOUS | Status: AC
  Filled 2015-07-01: qty 25

## 2015-07-01 MED ORDER — LACTATED RINGERS IV SOLN: INTRAVENOUS | Status: DC

## 2015-07-01 MED ORDER — SENNA 8.6 MG PO TABS
1.0000 | ORAL_TABLET | Freq: Two times a day (BID) | ORAL | Status: DC
  Administered 2015-07-01 – 2015-07-06 (×10): 8.6 mg via ORAL
  Filled 2015-07-01 (×10): qty 1

## 2015-07-01 MED ORDER — ACETAMINOPHEN 325 MG PO TABS
650.0000 mg | ORAL_TABLET | ORAL | Status: DC | PRN
  Administered 2015-07-01 – 2015-07-05 (×8): 650 mg via ORAL
  Filled 2015-07-01 (×8): qty 2

## 2015-07-01 MED ORDER — SODIUM CHLORIDE 0.9% FLUSH
3.0000 mL | Freq: Two times a day (BID) | INTRAVENOUS | Status: DC
  Administered 2015-07-01: 3 mL via INTRAVENOUS

## 2015-07-01 MED ORDER — ACETAMINOPHEN 650 MG RE SUPP
650.0000 mg | RECTAL | Status: DC | PRN
  Filled 2015-07-01: qty 1

## 2015-07-01 MED ORDER — NALOXONE HCL 0.4 MG/ML IJ SOLN
0.4000 mg | INTRAMUSCULAR | Status: DC | PRN
  Filled 2015-07-01: qty 1

## 2015-07-01 MED ORDER — NEOSTIGMINE METHYLSULFATE 10 MG/10ML IV SOLN
INTRAVENOUS | Status: DC | PRN
  Administered 2015-07-01: 4 mg via INTRAVENOUS

## 2015-07-01 MED ORDER — PIPERACILLIN-TAZOBACTAM 3.375 G IVPB
3.3750 g | Freq: Three times a day (TID) | INTRAVENOUS | Status: DC
  Administered 2015-07-01: 3.375 g via INTRAVENOUS
  Filled 2015-07-01 (×2): qty 50

## 2015-07-01 MED ORDER — DIPHENHYDRAMINE HCL 12.5 MG/5ML PO ELIX
12.5000 mg | ORAL_SOLUTION | Freq: Four times a day (QID) | ORAL | Status: DC | PRN
  Filled 2015-07-01: qty 5

## 2015-07-01 MED ORDER — HYDROMORPHONE HCL 1 MG/ML IJ SOLN
0.5000 mg | INTRAMUSCULAR | Status: DC | PRN
  Administered 2015-07-01 – 2015-07-06 (×21): 1 mg via INTRAVENOUS
  Filled 2015-07-01 (×23): qty 1

## 2015-07-01 MED ORDER — PHENOL 1.4 % MT LIQD
1.0000 | OROMUCOSAL | Status: DC | PRN
  Filled 2015-07-01 (×2): qty 177

## 2015-07-01 MED ORDER — FLEET ENEMA 7-19 GM/118ML RE ENEM: 1.0000 | ENEMA | Freq: Once | RECTAL | Status: DC | PRN

## 2015-07-01 MED ORDER — THROMBIN 20000 UNITS EX SOLR
CUTANEOUS | Status: DC | PRN
  Administered 2015-06-30: 20 mL via TOPICAL

## 2015-07-01 MED ORDER — 0.9 % SODIUM CHLORIDE (POUR BTL) OPTIME
TOPICAL | Status: DC | PRN
  Administered 2015-06-30: 1000 mL

## 2015-07-01 MED ORDER — ZOLPIDEM TARTRATE 5 MG PO TABS: 5.0000 mg | ORAL_TABLET | Freq: Every evening | ORAL | Status: DC | PRN

## 2015-07-01 MED ORDER — PIPERACILLIN-TAZOBACTAM 3.375 G IVPB 30 MIN
3.3750 g | Freq: Once | INTRAVENOUS | Status: AC
  Administered 2015-07-01: 3.375 g via INTRAVENOUS
  Filled 2015-07-01: qty 50

## 2015-07-01 MED ORDER — CEFAZOLIN SODIUM-DEXTROSE 2-3 GM-% IV SOLR
2.0000 g | Freq: Three times a day (TID) | INTRAVENOUS | Status: DC
  Administered 2015-07-01 – 2015-07-06 (×15): 2 g via INTRAVENOUS
  Filled 2015-07-01 (×18): qty 50

## 2015-07-01 MED ORDER — ONDANSETRON HCL 4 MG/2ML IJ SOLN
4.0000 mg | INTRAMUSCULAR | Status: DC | PRN
  Administered 2015-07-01 – 2015-07-05 (×9): 4 mg via INTRAVENOUS
  Filled 2015-07-01 (×8): qty 2

## 2015-07-01 MED ORDER — ALUM & MAG HYDROXIDE-SIMETH 200-200-20 MG/5ML PO SUSP
30.0000 mL | ORAL | Status: DC | PRN
  Filled 2015-07-01: qty 30

## 2015-07-01 MED ORDER — PANTOPRAZOLE SODIUM 40 MG IV SOLR
40.0000 mg | Freq: Every day | INTRAVENOUS | Status: DC
  Administered 2015-07-01 – 2015-07-03 (×3): 40 mg via INTRAVENOUS
  Filled 2015-07-01 (×3): qty 40

## 2015-07-01 MED ORDER — BISACODYL 5 MG PO TBEC
5.0000 mg | DELAYED_RELEASE_TABLET | Freq: Every day | ORAL | Status: DC | PRN
  Administered 2015-07-03: 5 mg via ORAL
  Filled 2015-07-01 (×2): qty 1

## 2015-07-01 MED ORDER — VANCOMYCIN HCL 1000 MG IV SOLR
INTRAVENOUS | Status: DC | PRN
  Administered 2015-07-01 (×3): 1000 mL

## 2015-07-01 MED ORDER — PROMETHAZINE HCL 25 MG/ML IJ SOLN: 6.2500 mg | INTRAMUSCULAR | Status: DC | PRN

## 2015-07-01 MED ORDER — SODIUM CHLORIDE 0.9% FLUSH
10.0000 mL | INTRAVENOUS | Status: DC | PRN
  Administered 2015-07-03 – 2015-07-06 (×3): 10 mL
  Filled 2015-07-01 (×3): qty 40

## 2015-07-01 MED ORDER — OXYCODONE-ACETAMINOPHEN 5-325 MG PO TABS
1.0000 | ORAL_TABLET | ORAL | Status: DC | PRN
  Administered 2015-07-01: 2 via ORAL
  Filled 2015-07-01 (×2): qty 2

## 2015-07-01 MED ORDER — SODIUM CHLORIDE 0.9 % IV SOLN
INTRAVENOUS | Status: DC
  Administered 2015-07-01 (×2): 100 mL/h via INTRAVENOUS
  Administered 2015-07-06: 06:00:00 via INTRAVENOUS

## 2015-07-01 MED ORDER — DOCUSATE SODIUM 100 MG PO CAPS
100.0000 mg | ORAL_CAPSULE | Freq: Two times a day (BID) | ORAL | Status: DC
  Administered 2015-07-01 – 2015-07-06 (×10): 100 mg via ORAL
  Filled 2015-07-01 (×10): qty 1

## 2015-07-01 MED ORDER — SODIUM CHLORIDE 0.9 % IV SOLN: 250.0000 mL | INTRAVENOUS | Status: DC

## 2015-07-01 NOTE — Consult Note (Signed)
[place] for Infectious Disease       Reason for Consult: post op wound infection    Referring Physician: Dr. Cyndy Freeze  Active Problems:   Wound infection ([place])   Deep incisional surgical site infection   . docusate sodium  100 mg Oral BID  . HYDROmorphone      . pantoprazole (PROTONIX) IV  40 mg Intravenous QHS  . piperacillin-tazobactam (ZOSYN)  IV  3.375 g Intravenous Q8H  . senna  1 tablet Oral BID  . sodium chloride flush  3 mL Intravenous Q12H  . vancomycin 1000 mg in NS (1000 ml) irrigation for Dr. Roxy Manns case   Irrigation To NeurOR  . vancomycin  1,000 mg Intravenous Q8H    Recommendations: Continue with vancomycin and cefazolin pending ID and sensitivities I will stop zosyn Repeat blood cultures TTE with bacteremia   Assessment: She has a deep wound infection following L4-5 fusion on 12/29 and GPC in wound gram stain and blood cultures with operative debridement 1/29 with explantation of bone growth stimulator.    Antibiotics: Vancomycin and zosyn  HPI: Cheryl Randall is a 40 y.o. female with a history of back fusion several years ago then in 2016 began to have left sided weakness and pain.  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  Then on 12/29 she underwent removal of L4-5 transfer set screw fixation, decompressive laminotomy and decompression of lateral recess stenosis and posterolateral fusion with internal bone growth stimulator placement.  She then presented to the ED 1/28 with 1 day of fever, chills, pain and drainage that was yellow and purulent.  She went to the OR that night and debrided and noted purulence and necrotic tissue from superficial area to deep edges.     Review of Systems:  Constitutional: negative for anorexia Respiratory: negative for cough Cardiovascular: negative for chest pain All other systems reviewed and are negative   Past Medical History  Diagnosis Date    . DDD (degenerative disc disease)   . Migraine   . PONV (postoperative nausea and vomiting)   . Family history of adverse reaction to anesthesia     Patients son has bad N/V  . Anxiety   . Diabetes mellitus without complication ([place])     borderline; diet controlled    Social History  Substance Use Topics  . Smoking status: Former Research scientist (life sciences)  . Smokeless tobacco: Never Used  . Alcohol Use: No    Family History  Problem Relation Age of Onset  . Cancer Father     bladder    Allergies  Allergen Reactions  . Codeine Itching and Rash  . Latex Itching and Rash  . Norco [Hydrocodone-Acetaminophen] Itching and Rash  . Tape Itching and Rash    Physical Exam: Constitutional: in no apparent distress and alert  Filed Vitals:   07/01/15 1003 07/01/15 1015  BP:  105/62  Pulse:  95  Temp:  99.1 F (37.3 C)  Resp: 16 15   EYES: anicteric ENMT: no thrush Cardiovascular: Cor RRR Respiratory: CTA B; normal respiratory effort GI: Bowel sounds are normal Musculoskeletal: no pedal edema noted Skin: negatives: no rash Hematologic: no cervical lad  Lab Results  Component Value Date   WBC 11.0* 07/01/2015   HGB 10.3* 07/01/2015   HCT 31.4* 07/01/2015   MCV 88.2 07/01/2015   PLT 157 07/01/2015    Lab Results  Component Value Date   CREATININE 0.80 07/01/2015  BUN 9 07/01/2015   NA 134* 07/01/2015   K 3.8 07/01/2015   CL 104 07/01/2015   CO2 26 07/01/2015    Lab Results  Component Value Date   ALT 11* 06/30/2015   AST 15 06/30/2015   ALKPHOS 48 06/30/2015     Microbiology: Recent Results (from the past 240 hour(s))  Culture, blood (routine x 2)     Status: None (Preliminary result)   Collection Time: 06/30/15  2:13 PM  Result Value Ref Range Status   Specimen Description BLOOD RIGHT ANTECUBITAL  Final   Special Requests BOTTLES DRAWN AEROBIC AND ANAEROBIC 5CC  Final   Culture  Setup Time   Final    GRAM POSITIVE COCCI IN CLUSTERS IN BOTH AEROBIC AND ANAEROBIC  BOTTLES CRITICAL RESULT CALLED TO, READ BACK BY AND VERIFIED WITH: [facility] 07/01/15 @ [place] M VESTAL    Culture GRAM POSITIVE COCCI  Final   Report Status PENDING  Incomplete  Urine culture     Status: None   Collection Time: 06/30/15  2:19 PM  Result Value Ref Range Status   Specimen Description URINE, CLEAN CATCH  Final   Special Requests Normal  Final   Culture MULTIPLE SPECIES PRESENT, SUGGEST RECOLLECTION  Final   Report Status 07/01/2015 FINAL  Final  Culture, blood (routine x 2)     Status: None (Preliminary result)   Collection Time: 06/30/15  2:22 PM  Result Value Ref Range Status   Specimen Description BLOOD LEFT ANTECUBITAL  Final   Special Requests BOTTLES DRAWN AEROBIC AND ANAEROBIC 5CC  Final   Culture  Setup Time   Final    GRAM POSITIVE COCCI IN CLUSTERS CRITICAL RESULT CALLED TO, READ BACK BY AND VERIFIED WITH: HENSON,J RN [id number] 1.29.17 MCADOO,G IN BOTH AEROBIC AND ANAEROBIC BOTTLES    Culture GRAM POSITIVE COCCI  Final   Report Status PENDING  Incomplete  Gram stain     Status: None   Collection Time: 07/01/15 12:20 AM  Result Value Ref Range Status   Specimen Description ABSCESS  Final   Special Requests LUMBAR EPIDURAL NO 3  Final   Gram Stain   Final    FEW WBC PRESENT,BOTH PMN AND MONONUCLEAR RARE GRAM POSITIVE COCCI IN PAIRS CRITICAL RESULT CALLED TO, READ BACK BY AND VERIFIED WITH: TEMBO,D RN 0221 1.29.17 MCADOO,G    Report Status 07/01/2015 FINAL  Final  Gram stain     Status: None   Collection Time: 07/01/15 12:24 AM  Result Value Ref Range Status   Specimen Description ABSCESS  Final   Special Requests LUMBAR DEEP NO 2  Final   Gram Stain   Final    RARE GRAM POSITIVE COCCI IN CLUSTERS FEW WBC PRESENT,BOTH PMN AND MONONUCLEAR CRITICAL RESULT CALLED TO, READ BACK BY AND VERIFIED WITH: TEMBO,D RN 0221 1.29.17 MCADOO,G    Report Status 07/01/2015 FINAL  Final  Gram stain     Status: None   Collection Time: 07/01/15 12:29 AM  Result Value Ref Range  Status   Specimen Description ABSCESS  Final   Special Requests LUMBAR SUPERFICIAL NO 1  Final   Gram Stain   Final    NO ORGANISMS SEEN FEW WBC PRESENT,BOTH PMN AND MONONUCLEAR    Report Status 07/01/2015 FINAL  Final    COMER, Herbie [place], [place] for Infectious Disease [facility] [email or web address] [phone] pager  [phone] cell 07/01/2015, 1:34 PM

## 2015-07-01 NOTE — Progress Notes (Signed)
OT Cancellation Note  Patient Details Name: Cheryl Randall MRN: [id number] DOB: 04/16/1976   Cancelled Treatment:    Reason Eval/Treat Not Completed: Pain limiting ability to participate;Fatigue/lethargy limiting ability to participate;Medical issues which prohibited therapy. Pt with nausea and reports emesis recently, pain, and noted to be lethargic. RN reports temp of 102. Will follow up for OT eval as pt is appropriate and time allows.  Binnie Kand M.S., OTR/L Pager: [phone]  07/01/2015, 2:37 PM

## 2015-07-01 NOTE — NC FL2 (Signed)
[place] LEVEL OF CARE SCREENING TOOL     IDENTIFICATION  Patient Name: Cheryl Randall Birthdate: May 08, 1976 Sex: female Admission Date (Current Location): 06/30/2015  [facility] and [place] Number:  Herbalist and Address:  [facility] [place], [place] [address], [place], [place] [address]      Provider Number: [id number]  Attending Physician Name and Address:  Karie Chimera, MD  Relative Name and Phone Number:       Current Level of Care: Hospital Recommended Level of Care: [place] Prior Approval Number:    Date Approved/Denied:   PASRR Number:    Discharge Plan: SNF    Current Diagnoses: Patient Active Problem List   Diagnosis Date Noted  . Deep incisional surgical site infection 07/01/2015  . Wound infection ([place]) 06/30/2015  . 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    Orientation RESPIRATION BLADDER Height & Weight    Self, Time, Situation, Place  Normal Continent 5\' 7"  (170.2 cm) 257 lbs.  BEHAVIORAL SYMPTOMS/MOOD NEUROLOGICAL BOWEL NUTRITION STATUS      Continent    AMBULATORY STATUS COMMUNICATION OF NEEDS Skin   Extensive Assist Verbally Normal (incision- back: other)                       Personal Care Assistance Level of Assistance  Bathing, Feeding, Dressing Bathing Assistance: Limited assistance Feeding assistance: Independent Dressing Assistance: Limited assistance     Functional Limitations Info  Sight, Hearing, Speech Sight Info: Adequate Hearing Info: Adequate Speech Info: Adequate    SPECIAL CARE FACTORS FREQUENCY  PT (By licensed PT), OT (By licensed OT)     PT Frequency: To be determined  OT Frequency: To be determined             Contractures      Additional Factors Info  Code Status, Allergies Code Status Info: FULL Allergies Info: Codeine, Latex, Norco, Tape           Current Medications (07/01/2015):  This is the current hospital active medication  list Current Facility-Administered Medications  Medication Dose Route Frequency Provider Last Rate Last Dose  . 0.9 %  sodium chloride infusion   Intravenous Continuous Kevan Ny Ditty, MD 100 mL/hr at 07/01/15 0437 100 mL/hr at 07/01/15 0437  . 0.9 %  sodium chloride infusion  250 mL Intravenous Continuous Kevan Ny Ditty, MD   250 mL at 07/01/15 0438  . acetaminophen (TYLENOL) tablet 650 mg  650 mg Oral Q4H PRN Kevan Ny Ditty, MD   650 mg at 07/01/15 1526   Or  . acetaminophen (TYLENOL) suppository 650 mg  650 mg Rectal Q4H PRN Kevan Ny Ditty, MD      . bisacodyl (DULCOLAX) EC tablet 5 mg  5 mg Oral Daily PRN Kevan Ny Ditty, MD      . ceFAZolin (ANCEF) IVPB 2 g/50 mL premix  2 g Intravenous 3 times per day Thayer Headings, MD   2 g at 07/01/15 1527  . cyclobenzaprine (FLEXERIL) tablet 10 mg  10 mg Oral TID PRN Kevan Ny Ditty, MD      . docusate sodium (COLACE) capsule 100 mg  100 mg Oral BID Kevan Ny Ditty, MD   100 mg at 07/01/15 0945  . HYDROmorphone (DILAUDID) injection 0.5-1 mg  0.5-1 mg Intravenous Q2H PRN Kevan Ny Ditty, MD   1 mg at 07/01/15 0742  . menthol-cetylpyridinium (CEPACOL) lozenge 3 mg  1 lozenge Oral PRN  Kevan Ny Ditty, MD       Or  . phenol [facility]) mouth spray 1 spray  1 spray Mouth/Throat PRN Kevan Ny Ditty, MD      . ondansetron [facility]) injection 4 mg  4 mg Intravenous Q4H PRN Kevan Ny Ditty, MD   4 mg at 07/01/15 1225  . oxyCODONE-acetaminophen (PERCOCET/ROXICET) 5-325 MG per tablet 1-2 tablet  1-2 tablet Oral Q4H PRN Kevan Ny Ditty, MD      . pantoprazole (PROTONIX) injection 40 mg  40 mg Intravenous QHS Kevan Ny Ditty, MD      . senna [facility]) tablet 8.6 mg  1 tablet Oral BID Kevan Ny Ditty, MD   8.6 mg at 07/01/15 0945  . sodium chloride flush (NS) 0.9 % injection 10-40 mL  10-40 mL Intracatheter PRN Karie Chimera, MD      . sodium chloride flush (NS) 0.9 % injection 3 mL  3 mL  Intravenous Q12H Kevan Ny Ditty, MD   3 mL at 07/01/15 0357  . sodium chloride flush (NS) 0.9 % injection 3 mL  3 mL Intravenous PRN Kevan Ny Ditty, MD      . sodium phosphate (FLEET) 7-19 GM/118ML enema 1 enema  1 enema Rectal Once PRN Kevan Ny Ditty, MD      . SUMAtriptan (IMITREX) tablet 50 mg  50 mg Oral BID PRN Kevan Ny Ditty, MD      . vancomycin (VANCOCIN) 1,000 mg in sodium chloride 0.9 % 1,000 mL irrigation   Irrigation To NeurOR Kevan Ny Ditty, MD      . vancomycin (VANCOCIN) IVPB 1000 mg/200 mL premix  1,000 mg Intravenous Q8H Karie Chimera, MD   1,000 mg at 07/01/15 0954  . zolpidem (AMBIEN) tablet 5 mg  5 mg Oral QHS PRN Tamala Fothergill, MD         Discharge Medications: Please see discharge summary for a list of discharge medications.  Relevant Imaging Results:  Relevant Lab Results:   Additional Information SS#: [id number]  Raymondo Band, LCSW

## 2015-07-01 NOTE — Progress Notes (Signed)
LUMBAR DEEP NO 2   Gram Stain RARE GRAM POSITIVE COCCI IN CLUSTERS  FEW WBC PRESENT,BOTH PMN AND MONONUCLEAR           LUMBAR EPIDURAL NO 3   Gram Stain FEW WBC PRESENT,BOTH PMN AND MONONUCLEAR  RARE GRAM POSITIVE COCCI IN PAIRS             Report called to Dr. Cyndy Freeze.

## 2015-07-01 NOTE — Progress Notes (Signed)
Pt returned to unit 0200hrs, A&O, 5/10 pain, 1L IV bolus in progress, Wound Vac present at 125, dressing clean, dry, and intact. IV at R A/C very positional and did eventually stop working, new IV established in L hand for continuation of bolus and other IV meds per transfer orders, PCA dilaudid for pain.

## 2015-07-01 NOTE — Op Note (Signed)
06/30/2015 - 07/01/2015  12:42 AM  PATIENT:  Cheryl Randall  40 y.o. female  PRE-OPERATIVE DIAGNOSIS:  Deep surgical site infection  POST-OPERATIVE DIAGNOSIS:  Same  PROCEDURE:  Exploration of lumbar wound, irrigation and debridement, explantation of bone growth stimulator, placement of wound vac  SURGEON:  Aldean Ast, MD  ASSISTANTS: None  ANESTHESIA:   General  DRAINS: Subfascial medium hemovac, wound vac  SPECIMEN:  Cultures from [place]: Rogue Jury underwent L4-5 fusion 1 month ago with Dr. Hal Neer. She presented to the ER today with 1 day of fever, chills, back pain, and yellow purulent drainage from her incision.. I recommended exploration, irrigation, and debridement. Patient understood the risks, benefits, and alternatives and potential outcomes and wished to proceed.  PROCEDURE DETAILS: After smooth induction of general endotracheal anesthesia she was turned to the prone position on a Wilson frame. The skin around the incision was wiped down with alcohol. The surgical site was prepped and draped in the usual sterile fashion. I widely excised the existing incision. I encountered copious purulent material above the level of the fascia. The fascial closure was disrupted due to necrosis of tissue along the suture line. On both sides of the wound from superficial to deep the edges were necrotic and nonviable.  I obtained cultures above and below the fascia and in the epidural space.  I removed the implantable bone growth stimulator.  I debrided the tissue on both sides of the wound using a Cobb elevator, Leksell rongeur, and osteophyte tool. After I had achieved healthy bleeding tissue on all sides I used a Pulsavac irrigator with vancomycin and saline. I used a 3 L bag of this mixture to irrigate all aspects of the wound. I was satisfied that all nonviable tissue had been debrided.  I placed a medium Hemovac drain below the fascia. I obtained  hemostasis. I placed 1 g of vancomycin powder beneath the fascia. I closed the fascia as best as could be identified due to the maceration of the tissues with interrupted Vicryl sutures. The remaining tissue was not closely apposed enough or strong enough to hold sutures.  It was apparent that the wound could not be closed primarily.  I cut a wound vac sponge to fit the tissue defect.  I then placed the wound vac film in the usual fashion and connected it to the vacuum device.  A good seal was achieved.  She was returned to the supine position and allowed to awaken from anesthesia.  PATIENT DISPOSITION:  PACU then floor   Delay start of Pharmacological VTE agent (>24hrs) due to surgical blood loss or risk of bleeding:  Yes

## 2015-07-01 NOTE — Progress Notes (Signed)
Specimen Description BLOOD LEFT ANTECUBITAL   Special Requests BOTTLES DRAWN AEROBIC AND ANAEROBIC 5CC   Culture Setup Time AEROBIC BOTTLE ONLY GRAM POSITIVE COCCI IN CLUSTERS  CRITICAL RESULT CALLED TO, READ BACK BY AND VERIFIED WITH: HENSON,J RN [phone] 1.29.17 MCADOO,G       Culture PENDING   Report Status PENDING   Resulting Agency [facility]      Specimen Collected: 06/30/15 2:22 PM Last Resulted: 07/01/15 6:48 AM

## 2015-07-01 NOTE — Progress Notes (Signed)
PT Cancellation Note  Patient Details Name: Cheryl Randall MRN: [id number] DOB: 25-Apr-1976   Cancelled Treatment:    Reason Eval/Treat Not Completed: Other (comment)   Second attempt at PT eval; still does not have brace; Pt also quite tired, lethargic;   Will return for PT eval tomorrow;   Cheryl Randall, [place] Pager [phone] Office [phone]    Cheryl Randall [facility] 07/01/2015, 3:21 PM

## 2015-07-01 NOTE — Transfer of Care (Signed)
Immediate Anesthesia Transfer of Care Note  Patient: Cheryl Randall  Procedure(s) Performed: Procedure(s): wound exploration irrigation and debridement ,explantation of bone growth stimulator, placement of wound vac (N/A)  Patient Location: PACU  Anesthesia Type:General  Level of Consciousness: awake  Airway & Oxygen Therapy: Patient Spontanous Breathing and Patient connected to nasal cannula oxygen  Post-op Assessment: Report given to RN and Post -op Vital signs reviewed and stable  Post vital signs: Reviewed and stable  Last Vitals:  Filed Vitals:   06/30/15 2100 07/01/15 0056  BP:    Pulse:    Temp: 38.4 C 36.9 C  Resp:      Complications: No apparent anesthesia complications

## 2015-07-01 NOTE — Progress Notes (Signed)
ANTIBIOTIC CONSULT NOTE - INITIAL  Pharmacy Consult for Vancomycin Indication: back wound  Allergies  Allergen Reactions  . Codeine Itching and Rash  . Latex Itching and Rash  . Norco [Hydrocodone-Acetaminophen] Itching and Rash  . Tape Itching and Rash    Patient Measurements: Height: 5\' 7"  (170.2 cm) Weight: 257 lb (116.574 kg) IBW/kg (Calculated) : 61.6  Vital Signs: Temp: 98.4 F (36.9 C) (01/29 0158) Temp Source: Oral (01/28 2100) BP: 107/57 mmHg (01/29 0155) Pulse Rate: 113 (01/29 0200) Intake/Output from previous day: 01/28 0701 - 01/29 0700 In: 1000 [I.V.:1000] Out: 200 [Blood:200] Intake/Output from this shift: Total I/O In: 1000 [I.V.:1000] Out: 200 [Blood:200]  Labs:  Recent Labs  06/30/15 1309  WBC 20.3*  HGB 12.5  PLT 202  CREATININE 0.87   Estimated Creatinine Clearance: 113.4 mL/min (by C-G formula based on Cr of 0.87). No results for input(s): VANCOTROUGH, VANCOPEAK, VANCORANDOM, GENTTROUGH, GENTPEAK, GENTRANDOM, TOBRATROUGH, TOBRAPEAK, TOBRARND, AMIKACINPEAK, AMIKACINTROU, AMIKACIN in the last 72 hours.   Microbiology: Recent Results (from the past 720 hour(s))  Gram stain     Status: None (Preliminary result)   Collection Time: 07/01/15 12:29 AM  Result Value Ref Range Status   Specimen Description ABSCESS  Final   Special Requests LUMBAR SUPERFICIAL NO 1  Final   Gram Stain   Final    NO ORGANISMS SEEN FEW WBC PRESENT,BOTH PMN AND MONONUCLEAR    Report Status PENDING  Incomplete    Medical History: Past Medical History  Diagnosis Date  . DDD (degenerative disc disease)   . Migraine   . PONV (postoperative nausea and vomiting)   . Family history of adverse reaction to anesthesia     Patients son has bad N/V  . Anxiety   . Diabetes mellitus without complication ([place])     borderline; diet controlled   Assessment: 40 y.o. 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 Presents with one day of fever,  chills, worsening back pain, wound drainage started yesterday. Reports drainage was yellow and purulent.  Now s/p I + D and to begin Vancomycin  Goal of Therapy:  Vancomycin trough level 10-15 mcg/ml  Plan:  Vancomycin 1 gram iv Q 8 hours starting at 8 am Follow up cultures, Scr, fever trend  Thank you Anette Guarneri, PharmD [phone]  Tad Moore 07/01/2015,2:29 AM

## 2015-07-01 NOTE — Progress Notes (Signed)
Peripherally Inserted Central Catheter/Midline Placement  The IV Nurse has discussed with the patient and/or persons authorized to consent for the patient, the purpose of this procedure and the potential benefits and risks involved with this procedure.  The benefits include less needle sticks, lab draws from the catheter and patient may be discharged home with the catheter.  Risks include, but not limited to, infection, bleeding, blood clot (thrombus formation), and puncture of an artery; nerve damage and irregular heat beat.  Alternatives to this procedure were also discussed.  PICC/Midline Placement Documentation  PICC Single Lumen 07/01/15 PICC Right Cephalic 37 cm 0 cm (Active)  Indication for Insertion or Continuance of Line Home intravenous therapies (PICC only) 07/01/2015  8:45 AM  Exposed Catheter (cm) 0 cm 07/01/2015  8:45 AM  Site Assessment Clean;Dry;Intact 07/01/2015  8:45 AM  Line Status Flushed;Saline locked;Blood return noted 07/01/2015  8:45 AM  Dressing Type Transparent 07/01/2015  8:45 AM  Dressing Status Clean;Dry;Intact;Antimicrobial disc in place 07/01/2015  8:45 AM  Line Care Connections checked and tightened 07/01/2015  8:45 AM  Line Adjustment (NICU/IV Team Only) No 07/01/2015  8:45 AM  Dressing Intervention New dressing 07/01/2015  8:45 AM  Dressing Change Due 07/08/15 07/01/2015  8:45 AM       Rolena Infante 07/01/2015, 8:46 AM

## 2015-07-01 NOTE — Progress Notes (Signed)
Patient feeling much better today Fevers and heart rate improved Wound VAC looks good Moving legs well White count down to 11 Positive blood cultures and wound culture Gram stain for gram-positive cocci in clusters Improving Continue wound care Infectious disease to guide selection and duration of antibiotics

## 2015-07-01 NOTE — Consult Note (Signed)
WOC consult received for NPWT VAC dressing, orders entered.  VAC placed 07/01/15, placed on T/Th/Sat schedule for that reason.  [place] team will follow up on Tuesday Jan. 31st with surgeon for first post op dressing change.  Graylee Arutyunyan [place] RN,CWOCN [phone]

## 2015-07-01 NOTE — Anesthesia Postprocedure Evaluation (Signed)
Anesthesia Post Note  Patient: Cheryl Randall  Procedure(s) Performed: Procedure(s) (LRB): wound exploration irrigation and debridement ,explantation of bone growth stimulator, placement of wound vac (N/A)  Patient location during evaluation: PACU Anesthesia Type: General Level of consciousness: awake and alert Pain management: pain level controlled Vital Signs Assessment: post-procedure vital signs reviewed and stable Respiratory status: spontaneous breathing, nonlabored ventilation, respiratory function stable and patient connected to nasal cannula oxygen Cardiovascular status: blood pressure returned to baseline and stable Postop Assessment: no signs of nausea or vomiting Anesthetic complications: no    Last Vitals:  Filed Vitals:   06/30/15 2100 07/01/15 0056  BP:  122/71  Pulse:  118  Temp: 38.4 C 36.9 C  Resp:  15    Last Pain:  Filed Vitals:   07/01/15 0115  PainSc: 7                  Effie Berkshire

## 2015-07-02 ENCOUNTER — Encounter (HOSPITAL_COMMUNITY): Payer: Self-pay | Admitting: Neurological Surgery

## 2015-07-02 DIAGNOSIS — Z95828 Presence of other vascular implants and grafts: Secondary | ICD-10-CM

## 2015-07-02 LAB — BASIC METABOLIC PANEL
ANION GAP: 7 (ref 5–15)
BUN: 8 mg/dL (ref 6–20)
CO2: 23 mmol/L (ref 22–32)
Calcium: 8.4 mg/dL — ABNORMAL LOW (ref 8.9–10.3)
Chloride: 104 mmol/L (ref 101–111)
Creatinine, Ser: 1.31 mg/dL — ABNORMAL HIGH (ref 0.44–1.00)
GFR calc Af Amer: 58 mL/min — ABNORMAL LOW (ref >60)
GFR, EST NON AFRICAN AMERICAN: 50 mL/min — AB (ref >60)
Glucose, Bld: 104 mg/dL — ABNORMAL HIGH (ref 65–99)
POTASSIUM: 3.2 mmol/L — AB (ref 3.5–5.1)
SODIUM: 134 mmol/L — AB (ref 135–145)

## 2015-07-02 LAB — CBC WITH DIFFERENTIAL/PLATELET
BASOS ABS: 0 10*3/uL (ref 0.0–0.1)
BASOS PCT: 0 %
EOS ABS: 0.1 10*3/uL (ref 0.0–0.7)
EOS PCT: 1 %
HCT: 29.5 % — ABNORMAL LOW (ref 36.0–46.0)
Hemoglobin: 9.7 g/dL — ABNORMAL LOW (ref 12.0–15.0)
Lymphocytes Relative: 7 %
Lymphs Abs: 0.2 10*3/uL — ABNORMAL LOW (ref 0.7–4.0)
MCH: 28.3 pg (ref 26.0–34.0)
MCHC: 32.9 g/dL (ref 30.0–36.0)
MCV: 86 fL (ref 78.0–100.0)
Monocytes Absolute: 0.3 10*3/uL (ref 0.1–1.0)
Monocytes Relative: 9 %
Neutro Abs: 3.1 10*3/uL (ref 1.7–7.7)
Neutrophils Relative %: 83 %
PLATELETS: 143 10*3/uL — AB (ref 150–400)
RBC: 3.43 MIL/uL — AB (ref 3.87–5.11)
RDW: 13.1 % (ref 11.5–15.5)
WBC: 3.7 10*3/uL — AB (ref 4.0–10.5)

## 2015-07-02 LAB — GLUCOSE, CAPILLARY
GLUCOSE-CAPILLARY: 111 mg/dL — AB (ref 65–99)
GLUCOSE-CAPILLARY: 110 mg/dL — AB (ref 65–99)
GLUCOSE-CAPILLARY: 116 mg/dL — AB (ref 65–99)

## 2015-07-02 LAB — ABO/RH: ABO/RH(D): O POS

## 2015-07-02 NOTE — Clinical Documentation Improvement (Signed)
Neuro Surgery Please update your documentation within the medical record to reflect your response to this query. Thank you  Can the diagnosis of "bacteremia" be further specified?   Sepsis - specify causative organism if known  Identify etiology of Sepsis - Device, Implant, Graft, Infusion, Abortion  Specify organ dysfunction - Respiratory Failure, Encephalopathy, Acute Kidney Failure, Pneumonia, UTI, Other (specify), Unable to Clinically Determine}  Other  Clinically Undetermined  Document any associated diagnoses/conditions.  Supporting Information: 06/30/15 H&P.Marland KitchenMarland Kitchen"Presents with one day of fever, chills, worsening back pain, wound drainage started yesterday."..."PLAN:- Wound exploration, irrigation, debridement, explantation of bone growth stimulator.- PICC line..." 07/01/15 ID consult note.Marland KitchenMarland Kitchen"Continue with vancomycin and cefazolin pending ID and sensitivities-I will stop zosyn-Repeat blood cultures-TTE with bacteremia" 07/02/15 ID consult note "Staph aureus, waiting for sensivities. Treat for 6 weeks with IV antibiotics through March 13th.".Marland KitchenMarland Kitchen  Please exercise your independent, professional judgment when responding. A specific answer is not anticipated or expected.  Thank You,  Ermelinda Das, RN, BSN, [place] Certified Clinical Documentation Specialist [place]: Health Information Management [phone]

## 2015-07-02 NOTE — Consult Note (Signed)
Please notify neurosurgery team to contact [place] nurse to plan first post op VAC dressing change, due on Tuesday Jan. 31st [facility] RN,CWOCN [phone]

## 2015-07-02 NOTE — Progress Notes (Signed)
Patient ID: Cheryl Randall, female   DOB: Aug 01, 1975, 40 y.o.   MRN: [id number] Afeb, vss Pain is better than post op Wound vac in place. Plan is for first wound change tomorrow. Will start to plan d/c for next day or two.

## 2015-07-02 NOTE — Evaluation (Signed)
Occupational Therapy Evaluation Patient Details Name: Cheryl Randall MRN: [id number] DOB: 1975/10/06 Today's Date: 07/02/2015    History of Present Illness 40 y.o. s/p Exploration of lumbar wound, irrigation and debridement, explantation of bone growth stimulator, placement of wound vac on 1/29. Pt with recent back fusion (about 1 month ago). PMH includes DDD, PONV, migraine, anxiety, borderline diabetes complication, previous back surgeries.   Clinical Impression   Pt s/p above. Pt getting assist with LB dressing and IADLs, PTA. Feel pt will benefit from acute OT to increase independence and reinforce precautions prior to d/c.    Follow Up Recommendations  No OT follow up;Supervision/Assistance - 24 hour    Equipment Recommendations  3 in 1 bedside comode    Recommendations for Other Services       Precautions / Restrictions Precautions Precautions: Fall;Back Precaution Booklet Issued: No Precaution Comments: reviewed back precautions; pt has wound vac Required Braces or Orthoses: Spinal Brace Spinal Brace: Lumbar corset;Applied in sitting position Restrictions Weight Bearing Restrictions: No      Mobility Bed Mobility Overal bed mobility: Needs Assistance Bed Mobility: Sit to Sidelying;Supine to Sit;Sit to Supine;Sidelying to Sit   Sidelying to sit: Supervision Supine to sit: Supervision Sit to supine: Supervision Sit to sidelying: Supervision General bed mobility comments: cues for log roll technique but pt would sit up in bed.   Transfers Overall transfer level: Needs assistance Equipment used: None Transfers: Sit to/from Stand Sit to Stand: Supervision         General transfer comment: pt took a few steps and turned to sit on bed.    Balance Balance not formally assessed.                                         ADL Overall ADL's : Needs assistance/impaired                 Upper Body Dressing : Minimal assistance;Sitting    Lower Body Dressing: Moderate assistance;Sit to/from stand Lower Body Dressing Details (indicate cue type and reason): maintaining back precautions Toilet Transfer: Supervision/safety;Ambulation (took a few steps and turned to sit on bed)           Functional mobility during ADLs: Supervision/safety General ADL Comments: Discussed incorporating precautions into functional activities. Educated on back brace.      Vision     Perception     Praxis      Pertinent Vitals/Pain Pain Assessment: 0-10 Pain Score: 8  Faces Pain Scale: Hurts a little bit Pain Location: back and head Pain Descriptors / Indicators: Headache;Burning Pain Intervention(s): Monitored during session;Repositioned     Hand Dominance Right   Extremity/Trunk Assessment Upper Extremity Assessment Upper Extremity Assessment: Overall WFL for tasks assessed   Lower Extremity Assessment Lower Extremity Assessment: Defer to PT evaluation   Cervical / Trunk Assessment Cervical / Trunk Assessment: Normal   Communication Communication Communication: No difficulties   Cognition Arousal/Alertness: Awake/alert Behavior During Therapy: Impulsive Overall Cognitive Status: No family/caregiver present to determine baseline cognitive functioning                     General Comments       Exercises       Shoulder Instructions      Home Living Family/patient expects to be discharged to:: Private residence Living Arrangements: Children;Parent;Other relatives Available Help at Discharge: Family;Available 24 hours/day Type  of Home: House Home Access: Stairs to enter [employer] of Steps: 5   Home Layout: One level     Bathroom Shower/Tub: Teacher, early years/pre: Standard Bathroom Accessibility: Yes   Home Equipment: None          Prior Functioning/Environment Level of Independence: Needs assistance    ADL's / Homemaking Assistance Needed: assist with LB dressing,  cooking and cleaning       OT Diagnosis: Acute pain;Generalized weakness   OT Problem List: Decreased strength;Pain;Decreased knowledge of precautions;Decreased knowledge of use of DME or AE;Decreased activity tolerance;Decreased safety awareness;Obesity   OT Treatment/Interventions: Patient/family education;Self-care/ADL training;DME and/or AE instruction;Therapeutic activities;Balance training;Cognitive remediation/compensation    OT Goals(Current goals can be found in the care plan section) Acute Rehab OT Goals Patient Stated Goal: not stated OT Goal Formulation: With patient Time For Goal Achievement: 07/09/15 Potential to Achieve Goals: Good ADL Goals Pt Will Perform Lower Body Dressing: with set-up;with supervision;sit to/from stand (with or without AE) Pt Will Transfer to Toilet: with modified independence;ambulating Pt Will Perform Toileting - Clothing Manipulation and hygiene: with modified independence;sit to/from stand Pt Will Perform Tub/Shower Transfer: Tub transfer;with supervision;ambulating;with set-up;3 in 1 Additional ADL Goal #1: Pt will independently verbalize 3/3 back precautions and maintain during session during functional mobility and ADLs. Additional ADL Goal #2: Pt will don/doff back brace with setup assist.  OT Frequency: Min 2X/week   Barriers to D/C:            Co-evaluation              End of Session Equipment Utilized During Treatment: Back brace Nurse Communication: Other (comment) (pt nauseous; pain; impulsive)  Activity Tolerance: Other (comment) (nauseous) Patient left: in bed;with call bell/phone within reach;with bed alarm set   Time: [id number] OT Time Calculation (min): 18 min Charges:  OT General Charges $OT Visit: 1 Procedure OT Evaluation $OT Eval Moderate Complexity: 1 Procedure G-CodesBenito Mccreedy OTR/L [id number] 07/02/2015, 4:19 PM

## 2015-07-02 NOTE — Care Management Note (Addendum)
Case Management Note  Patient Details  Name: Cheryl Randall MRN: [id number] Date of Birth: 05-Mar-1976  Subjective/Objective:                    Action/Plan: Plan is for patient to discharge to her mothers house with home health services for wound vac and IV therapy. CM met with the patient and she states her insurance lapsed at the beginning of January except for surgery coverage. Since the patient does not have insurance [facility] will follow her. Pam with [facility] IV therapy was notified of the referral. She states she would let the home health liaison know about the RN needed. Rickie with [facility] also informed of the wound vac that will be needed for home. CM will continue to follow for further d/c needs.   Addendum: Charity form filled out and faxed to [employer] with [facility]. Will fax the order sheet in the am after Dr Hal Neer signs the form.   Expected Discharge Date:                  Expected Discharge Plan:  [place]  In-House Referral:     Discharge planning Services  CM Consult  Post Acute Care Choice:  Home Health Choice offered to:  Patient  DME Arranged:    DME Agency:     HH Arranged:    [place] Agency:     Status of Service:  In process, will continue to follow  Medicare Important Message Given:    Date Medicare IM Given:    Medicare IM give by:    Date Additional Medicare IM Given:    Additional Medicare Important Message give by:     If discussed at [place] of Stay Meetings, dates discussed:    Additional Comments:  Pollie Friar, RN 07/02/2015, 3:12 PM

## 2015-07-02 NOTE — Progress Notes (Signed)
[facility] for Infectious Disease   Reason for visit: Follow up on deep wound infection  Interval History: continues pain, culture with Staph aureus, sensitivities pending. Afebrile.  Pain in back. Has picc line already.  Repeat blood cultures sent today.    Physical Exam: Constitutional:  Filed Vitals:   07/02/15 0609 07/02/15 0952  BP: 134/76 119/68  Pulse: 117   Temp: 98.4 F (36.9 C) 98.4 F (36.9 C)  Resp: 18 18   patient appears in NAD Respiratory: Normal respiratory effort; CTA B Cardiovascular: RRR Back: wound vac in place  Review of Systems: Constitutional: negative for chills Cardiovascular: negative for chest pain Gastrointestinal: negative for nausea and diarrhea  Lab Results  Component Value Date   WBC 3.7* 07/02/2015   HGB 9.7* 07/02/2015   HCT 29.5* 07/02/2015   MCV 86.0 07/02/2015   PLT 143* 07/02/2015    Lab Results  Component Value Date   CREATININE 1.31* 07/02/2015   BUN 8 07/02/2015   NA 134* 07/02/2015   K 3.2* 07/02/2015   CL 104 07/02/2015   CO2 23 07/02/2015    Lab Results  Component Value Date   ALT 11* 06/30/2015   AST 15 06/30/2015   ALKPHOS 48 06/30/2015     Microbiology: Recent Results (from the past 240 hour(s))  Wound culture     Status: None (Preliminary result)   Collection Time: 06/30/15  2:11 PM  Result Value Ref Range Status   Specimen Description WOUND BACK  Final   Special Requests Normal  Final   Gram Stain   Final    NO WBC SEEN NO SQUAMOUS EPITHELIAL CELLS SEEN FEW GRAM POSITIVE COCCI IN PAIRS Performed at [facility]    Culture   Final    Culture reincubated for better growth Performed at [facility]    Report Status PENDING  Incomplete  Culture, blood (routine x 2)     Status: None (Preliminary result)   Collection Time: 06/30/15  2:13 PM  Result Value Ref Range Status   Specimen Description BLOOD RIGHT ANTECUBITAL  Final   Special Requests BOTTLES DRAWN AEROBIC AND ANAEROBIC  5CC  Final   Culture  Setup Time   Final    GRAM POSITIVE COCCI IN CLUSTERS IN BOTH AEROBIC AND ANAEROBIC BOTTLES CRITICAL RESULT CALLED TO, READ BACK BY AND VERIFIED WITH: [facility] 07/01/15 @ [place] M VESTAL    Culture STAPHYLOCOCCUS AUREUS  Final   Report Status PENDING  Incomplete  Urine culture     Status: None   Collection Time: 06/30/15  2:19 PM  Result Value Ref Range Status   Specimen Description URINE, CLEAN CATCH  Final   Special Requests Normal  Final   Culture MULTIPLE SPECIES PRESENT, SUGGEST RECOLLECTION  Final   Report Status 07/01/2015 FINAL  Final  Culture, blood (routine x 2)     Status: None (Preliminary result)   Collection Time: 06/30/15  2:22 PM  Result Value Ref Range Status   Specimen Description BLOOD LEFT ANTECUBITAL  Final   Special Requests BOTTLES DRAWN AEROBIC AND ANAEROBIC 5CC  Final   Culture  Setup Time   Final    GRAM POSITIVE COCCI IN CLUSTERS CRITICAL RESULT CALLED TO, READ BACK BY AND VERIFIED WITH: HENSON,J RN [id number] 1.29.17 MCADOO,G IN BOTH AEROBIC AND ANAEROBIC BOTTLES    Culture STAPHYLOCOCCUS AUREUS  Final   Report Status PENDING  Incomplete  Anaerobic culture     Status: None (Preliminary result)  Collection Time: 07/01/15 12:17 AM  Result Value Ref Range Status   Specimen Description ABSCESS  Final   Special Requests LUMBAR  EPIDURAL  Final   Gram Stain   Final    RARE WBC PRESENT, PREDOMINANTLY PMN NO SQUAMOUS EPITHELIAL CELLS SEEN FEW GRAM POSITIVE COCCI IN CLUSTERS Performed at [facility]    Culture PENDING  Incomplete   Report Status PENDING  Incomplete  Fungus Culture with Smear     Status: None (Preliminary result)   Collection Time: 07/01/15 12:19 AM  Result Value Ref Range Status   Specimen Description ABSCESS  Final   Special Requests LUMBAR EPIDURAL NO 3   Final   Fungal Smear   Final    NO YEAST OR FUNGAL ELEMENTS SEEN Performed at [facility]    Culture   Final    CULTURE IN PROGRESS FOR FOUR  WEEKS Performed at [facility]    Report Status PENDING  Incomplete  Gram stain     Status: None   Collection Time: 07/01/15 12:20 AM  Result Value Ref Range Status   Specimen Description ABSCESS  Final   Special Requests LUMBAR EPIDURAL NO 3  Final   Gram Stain   Final    FEW WBC PRESENT,BOTH PMN AND MONONUCLEAR RARE GRAM POSITIVE COCCI IN PAIRS CRITICAL RESULT CALLED TO, READ BACK BY AND VERIFIED WITH: TEMBO,D RN [id number] 1.29.17 MCADOO,G    Report Status 07/01/2015 FINAL  Final  Culture, routine-abscess     Status: None (Preliminary result)   Collection Time: 07/01/15 12:21 AM  Result Value Ref Range Status   Specimen Description ABSCESS  Final   Special Requests LUMBAR EPIDURAL NO 3  Final   Gram Stain   Final    RARE WBC PRESENT, PREDOMINANTLY PMN NO SQUAMOUS EPITHELIAL CELLS SEEN FEW GRAM POSITIVE COCCI IN CLUSTERS Performed at [facility]    Culture   Final    Culture reincubated for better growth Performed at [facility]    Report Status PENDING  Incomplete  Anaerobic culture     Status: None (Preliminary result)   Collection Time: 07/01/15 12:23 AM  Result Value Ref Range Status   Specimen Description ABSCESS  Final   Special Requests LUMBAR DEEP NO 2  Final   Gram Stain   Final    FEW WBC PRESENT, PREDOMINANTLY PMN NO SQUAMOUS EPITHELIAL CELLS SEEN FEW GRAM POSITIVE COCCI IN PAIRS IN CLUSTERS Performed at [facility]    Culture PENDING  Incomplete   Report Status PENDING  Incomplete  Fungus Culture with Smear     Status: None (Preliminary result)   Collection Time: 07/01/15 12:24 AM  Result Value Ref Range Status   Specimen Description ABSCESS  Final   Special Requests LUMBAR DEEP NO 2  Final   Fungal Smear   Final    NO YEAST OR FUNGAL ELEMENTS SEEN Performed at [facility]    Culture   Final    CULTURE IN PROGRESS FOR FOUR WEEKS Performed at [facility]    Report Status PENDING  Incomplete  Gram  stain     Status: None   Collection Time: 07/01/15 12:24 AM  Result Value Ref Range Status   Specimen Description ABSCESS  Final   Special Requests LUMBAR DEEP NO 2  Final   Gram Stain   Final    RARE GRAM POSITIVE COCCI IN CLUSTERS FEW WBC PRESENT,BOTH PMN AND MONONUCLEAR CRITICAL RESULT CALLED TO, READ  BACK BY AND VERIFIED WITH: TEMBO,D RN 0221 1.29.17 MCADOO,G    Report Status 07/01/2015 FINAL  Final  Culture, routine-abscess     Status: None (Preliminary result)   Collection Time: 07/01/15 12:26 AM  Result Value Ref Range Status   Specimen Description ABSCESS  Final   Special Requests LUMBAR DEEP NO 2  Final   Gram Stain   Final    FEW WBC PRESENT, PREDOMINANTLY PMN NO SQUAMOUS EPITHELIAL CELLS SEEN FEW GRAM POSITIVE COCCI IN PAIRS IN CLUSTERS Performed at [facility]    Culture   Final    ABUNDANT STAPHYLOCOCCUS AUREUS Note: RIFAMPIN AND GENTAMICIN SHOULD NOT BE USED AS SINGLE DRUGS FOR TREATMENT OF STAPH INFECTIONS. Performed at [facility]    Report Status PENDING  Incomplete  Anaerobic culture     Status: None (Preliminary result)   Collection Time: 07/01/15 12:28 AM  Result Value Ref Range Status   Specimen Description ABSCESS  Final   Special Requests LUMBAR SUPERFICIAL NO 1  Final   Gram Stain   Final    RARE WBC PRESENT, PREDOMINANTLY PMN NO SQUAMOUS EPITHELIAL CELLS SEEN NO ORGANISMS SEEN Performed at [facility]    Culture PENDING  Incomplete   Report Status PENDING  Incomplete  Fungus Culture with Smear     Status: None (Preliminary result)   Collection Time: 07/01/15 12:28 AM  Result Value Ref Range Status   Specimen Description ABSCESS  Final   Special Requests LUMBAR SUPERFICIAL NO 1  Final   Fungal Smear   Final    NO YEAST OR FUNGAL ELEMENTS SEEN Performed at [facility]    Culture   Final    CULTURE IN PROGRESS FOR FOUR WEEKS Performed at [facility]    Report Status PENDING  Incomplete  Gram  stain     Status: None   Collection Time: 07/01/15 12:29 AM  Result Value Ref Range Status   Specimen Description ABSCESS  Final   Special Requests LUMBAR SUPERFICIAL NO 1  Final   Gram Stain   Final    NO ORGANISMS SEEN FEW WBC PRESENT,BOTH PMN AND MONONUCLEAR    Report Status 07/01/2015 FINAL  Final  Culture, routine-abscess     Status: None (Preliminary result)   Collection Time: 07/01/15 12:30 AM  Result Value Ref Range Status   Specimen Description ABSCESS  Final   Special Requests LUMBAR SUPERFICIAL NO 1  Final   Gram Stain   Final    RARE WBC PRESENT, PREDOMINANTLY PMN NO SQUAMOUS EPITHELIAL CELLS SEEN NO ORGANISMS SEEN Performed at [facility]    Culture   Final    Culture reincubated for better growth Performed at [facility]    Report Status PENDING  Incomplete    Impression/Plan:  1. Post op wound infection - Staph aureus, waiting for sensivities.  Treat for 6 weeks with IV antibiotics through March 13th. Antibiotics per home health protocol Weekly labs to [facility]  2. Pain - improved.  Per primary team

## 2015-07-02 NOTE — Progress Notes (Signed)
Pt not ambulated, did stand and pivot a couple times to [facility], Pt has no brace at this time, states family member has it their car and will bring

## 2015-07-02 NOTE — Evaluation (Signed)
Physical Therapy Evaluation Patient Details Name: Cheryl Randall MRN: [id number] DOB: 1976/05/16 Today's Date: 07/02/2015   History of Present Illness  Cheryl Randall is a 40 y.o. 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 Presents with one day of fever, chills, worsening back pain, wound drainage started yesterday. Reports drainage was yellow and purulent. Exploration of lumbar wound, irrigation and debridement, explantation of bone growth stimulator, placement of wound vac;  has a past medical history of DDD (degenerative disc disease); Migraine; PONV (postoperative nausea and vomiting)  Clinical Impression  Pt admitted with above diagnosis. Pt currently with functional limitations due to the deficits listed below (see PT Problem List). At the time of PT eval pt reports generally feeling poor, but was willing to work with therapy. Pt was educated on back precautions and general safety during session. Pt reluctant to use brace as she reports wound was very painful. Pt will benefit from skilled PT to increase their independence and safety with mobility to allow discharge to the venue listed below.       Follow Up Recommendations No PT follow up    Equipment Recommendations  3in1 (PT)    Recommendations for Other Services       Precautions / Restrictions Precautions Precautions: Fall;Back Precaution Booklet Issued: No Precaution Comments: Wound vac. Reviewed back precautions during functional mobility Required Braces or Orthoses: Spinal Brace (Pt has lumbar corset. TLSO ordered. ) Spinal Brace: Applied in sitting position Restrictions Weight Bearing Restrictions: No      Mobility  Bed Mobility Overal bed mobility: Independent                Transfers Overall transfer level: Modified independent Equipment used: None             General transfer comment: Increased time. Use of hands on knees for support.   Ambulation/Gait Ambulation/Gait  assistance: Modified independent (Device/Increase time) Ambulation Distance (Feet): 20 Feet Assistive device: None Gait Pattern/deviations: Step-through pattern;Decreased stride length Gait velocity: Decreased Gait velocity interpretation: Below normal speed for age/gender General Gait Details: In room. Pt did not require physical assistance for mobility however therapist managed IV pole and wound vac.  Stairs            Wheelchair Mobility    Modified Rankin (Stroke Patients Only)       Balance Overall balance assessment: No apparent balance deficits (not formally assessed)                                           Pertinent Vitals/Pain Pain Assessment: Faces Faces Pain Scale: Hurts a little bit Pain Location: Back Pain Descriptors / Indicators: Operative site guarding Pain Intervention(s): Limited activity within patient's tolerance;Monitored during session;Repositioned    Home Living Family/patient expects to be discharged to:: Private residence Living Arrangements: Children;Parent;Other relatives Available Help at Discharge: Family;Available 24 hours/day Type of Home: House Home Access: Stairs to enter   [employer] of Steps: 5 Home Layout: One level Home Equipment: None      Prior Function Level of Independence: Independent         Comments: Pt required minor assistance post-op but states she has been doing well until she started feeling bad from the infection.     Hand Dominance   Dominant Hand: Right    Extremity/Trunk Assessment   Upper Extremity Assessment: Defer to OT  evaluation           Lower Extremity Assessment: Overall WFL for tasks assessed      Cervical / Trunk Assessment: Normal  Communication   Communication: No difficulties  Cognition Arousal/Alertness: Awake/alert Behavior During Therapy: WFL for tasks assessed/performed Overall Cognitive Status: Within Functional Limits for tasks assessed                       General Comments      Exercises        Assessment/Plan    PT Assessment Patient needs continued PT services  PT Diagnosis Difficulty walking   PT Problem List Decreased strength;Decreased range of motion;Decreased balance;Decreased activity tolerance;Decreased mobility;Decreased safety awareness;Decreased knowledge of use of DME;Decreased knowledge of precautions;Pain;Decreased skin integrity  PT Treatment Interventions DME instruction;Gait training;Stair training;Functional mobility training;Therapeutic activities;Therapeutic exercise;Neuromuscular re-education;Patient/family education   PT Goals (Current goals can be found in the Care Plan section) Acute Rehab PT Goals PT Goal Formulation: With patient Time For Goal Achievement: 07/09/15 Potential to Achieve Goals: Good    Frequency Min 3X/week   Barriers to discharge        Co-evaluation               End of Session Equipment Utilized During Treatment: Back brace Activity Tolerance: Patient tolerated treatment well Patient left: in chair;with call bell/phone within reach Nurse Communication: Mobility status         Time: [id number] PT Time Calculation (min) (ACUTE ONLY): 25 min   Charges:   PT Evaluation $PT Eval Moderate Complexity: 1 Procedure PT Treatments $Gait Training: 8-22 mins   PT G Codes:        Rolinda Roan Aug 01, 2015, 2:51 PM   Rolinda Roan, PT, DPT Acute Rehabilitation Services Pager: [phone]

## 2015-07-02 NOTE — Consult Note (Addendum)
[place] team will plan to meet Dr Hal Neer at 0900 on Tues 1/31 as requested for Vac dressing change. Julien Girt MSN, RN, [place], [place], [place]

## 2015-07-02 NOTE — Progress Notes (Signed)
[facility]  Patient Status:  New pt for [facility] this admission  [facility] is providing the following services: HHRN and [facility] for home IV ABX. [facility] hospital team will follow Ms. Wilson while an inpatient to support transition home when ordered.  If patient discharges after hours, please call [phone].   Larry Sierras 07/02/2015, 9:02 PM

## 2015-07-02 NOTE — Care Management Note (Signed)
Case Management Note  Patient Details  Name: Cheryl Randall MRN: [id number] Date of Birth: 1975/10/30  Subjective/Objective:                    Action/Plan: Patient was admitted for post-op wound infection/dehiscence, underwent I&D and wound vac placement. Patient is currently listed as self-pay.  CM will follow for discharge needs pending PT/OT evals and physician orders.  Expected Discharge Date:                  Expected Discharge Plan:     In-House Referral:     Discharge planning Services     Post Acute Care Choice:    Choice offered to:     DME Arranged:    DME Agency:     HH Arranged:    HH Agency:     Status of Service:  In process, will continue to follow  Medicare Important Message Given:    Date Medicare IM Given:    Medicare IM give by:    Date Additional Medicare IM Given:    Additional Medicare Important Message give by:     If discussed at [place] of Stay Meetings, dates discussed:    Additional CommentsRolm Baptise, RN 07/02/2015, 11:58 AM [phone]

## 2015-07-03 DIAGNOSIS — T814XXD Infection following a procedure, subsequent encounter: Secondary | ICD-10-CM

## 2015-07-03 DIAGNOSIS — B9561 Methicillin susceptible Staphylococcus aureus infection as the cause of diseases classified elsewhere: Secondary | ICD-10-CM

## 2015-07-03 DIAGNOSIS — Y839 Surgical procedure, unspecified as the cause of abnormal reaction of the patient, or of later complication, without mention of misadventure at the time of the procedure: Secondary | ICD-10-CM

## 2015-07-03 DIAGNOSIS — G061 Intraspinal abscess and granuloma: Secondary | ICD-10-CM

## 2015-07-03 LAB — GLUCOSE, CAPILLARY
GLUCOSE-CAPILLARY: 94 mg/dL (ref 65–99)
Glucose-Capillary: 131 mg/dL — ABNORMAL HIGH (ref 65–99)

## 2015-07-03 LAB — CBC WITH DIFFERENTIAL/PLATELET
Basophils Absolute: 0 10*3/uL (ref 0.0–0.1)
Basophils Relative: 1 %
Eosinophils Absolute: 0.1 10*3/uL (ref 0.0–0.7)
Eosinophils Relative: 3 %
HEMATOCRIT: 27.4 % — AB (ref 36.0–46.0)
HEMOGLOBIN: 8.9 g/dL — AB (ref 12.0–15.0)
LYMPHS ABS: 0.5 10*3/uL — AB (ref 0.7–4.0)
Lymphocytes Relative: 16 %
MCH: 27.6 pg (ref 26.0–34.0)
MCHC: 32.5 g/dL (ref 30.0–36.0)
MCV: 84.8 fL (ref 78.0–100.0)
MONOS PCT: 15 %
Monocytes Absolute: 0.5 10*3/uL (ref 0.1–1.0)
NEUTROS ABS: 2 10*3/uL (ref 1.7–7.7)
NEUTROS PCT: 65 %
Platelets: 152 10*3/uL (ref 150–400)
RBC: 3.23 MIL/uL — AB (ref 3.87–5.11)
RDW: 13.1 % (ref 11.5–15.5)
WBC: 3.1 10*3/uL — AB (ref 4.0–10.5)

## 2015-07-03 LAB — CULTURE, ROUTINE-ABSCESS

## 2015-07-03 LAB — CULTURE, BLOOD (ROUTINE X 2)

## 2015-07-03 LAB — BASIC METABOLIC PANEL
Anion gap: 10 (ref 5–15)
BUN: 7 mg/dL (ref 6–20)
CALCIUM: 8.3 mg/dL — AB (ref 8.9–10.3)
CHLORIDE: 99 mmol/L — AB (ref 101–111)
CO2: 25 mmol/L (ref 22–32)
CREATININE: 1.35 mg/dL — AB (ref 0.44–1.00)
GFR calc non Af Amer: 48 mL/min — ABNORMAL LOW (ref >60)
GFR, EST AFRICAN AMERICAN: 56 mL/min — AB (ref >60)
Glucose, Bld: 111 mg/dL — ABNORMAL HIGH (ref 65–99)
POTASSIUM: 3.1 mmol/L — AB (ref 3.5–5.1)
SODIUM: 134 mmol/L — AB (ref 135–145)

## 2015-07-03 LAB — WOUND CULTURE
GRAM STAIN: NONE SEEN
SPECIAL REQUESTS: NORMAL

## 2015-07-03 LAB — VANCOMYCIN, TROUGH: Vancomycin Tr: 27 ug/mL (ref 10.0–20.0)

## 2015-07-03 MED ORDER — POLYETHYLENE GLYCOL 3350 17 G PO PACK
17.0000 g | PACK | Freq: Every day | ORAL | Status: DC
Start: 2015-07-03 — End: 2015-07-06
  Administered 2015-07-05: 17 g via ORAL
  Filled 2015-07-03 (×2): qty 1

## 2015-07-03 NOTE — Progress Notes (Signed)
Patient ID: Cheryl Randall, female   DOB: 03-23-1976, 40 y.o.   MRN: [id number] Afeb, vss No new neuro issues Feeling better. I was there for wound change and the tissue looks good. The wound is fairly large and will take some time to heal in with the wound vac. She will be ready for d/c as soon as her home health wound care nursing is arranged.

## 2015-07-03 NOTE — Progress Notes (Addendum)
Occupational Therapy Treatment Patient Details Name: Cheryl Randall MRN: [id number] DOB: 1975-10-19 Today's Date: 07/03/2015    History of present illness 40 y.o. s/p Exploration of lumbar wound, irrigation and debridement, explantation of bone growth stimulator, placement of wound vac on 1/29. Pt with recent back fusion (about 1 month ago). PMH includes DDD, PONV, migraine, anxiety, borderline diabetes complication, previous back surgeries.   OT comments  Education provided in session. Will continue to follow acutely. Feel pt will benefit from additional session to reinforce precautions.  Follow Up Recommendations  No OT follow up;Supervision/Assistance - 24 hour    Equipment Recommendations  3 in 1 bedside comode    Recommendations for Other Services      Precautions / Restrictions Precautions Precautions: Fall;Back Precaution Booklet Issued: No Precaution Comments: reviewed back precautions; pt has wound vac Required Braces or Orthoses: Spinal Brace Spinal Brace: Lumbar corset;Applied in sitting position Restrictions Weight Bearing Restrictions: No       Mobility Bed Mobility               General bed mobility comments: pt sitting EOB  Transfers Overall transfer level: Needs assistance   Transfers: Sit to/from Stand Sit to Stand: Supervision              Balance                                   ADL Overall ADL's : Needs assistance/impaired     Grooming: Oral care;Supervision/safety;Standing (items already at sink)           Upper Body Dressing : Minimal assistance;Sitting Upper Body Dressing Details (indicate cue type and reason): back brace Lower Body Dressing: Supervision/safety;Set up;Sitting/lateral leans Lower Body Dressing Details (indicate cue type and reason): donning/doffing sock; practiced with and without AE Toilet Transfer: Supervision/safety;Ambulation (sit to stand from bed)             General ADL Comments:  Cues given in session for precautions. Discussed incorporating precautions into functional activities. Educated on tub transfer technique and recommended someone be with her for this. Educated on uses of 3 in 1. Educated on what pt could use for toilet aid. Educated on AE.  pt reports she has something she can use as toilet aid at home.      Vision                     Perception     Praxis      Cognition   Behavior During Therapy: WFL for tasks assessed/performed Overall Cognitive Status:  (unsure of baseline)       Memory: Decreased recall of precautions;Decreased short-term memory               Extremity/Trunk Assessment               Exercises     Shoulder Instructions       General Comments      Pertinent Vitals/ Pain       Pain Assessment: 0-10 Pain Score: 6  Pain Location: back Pain Descriptors / Indicators: Other (Comment) (stinging) Pain Intervention(s): Monitored during session  Home Living                                          Prior Functioning/Environment  Frequency Min 2X/week     Progress Toward Goals  OT Goals(current goals can now be found in the care plan section)  Progress towards OT goals: Progressing toward goals  Acute Rehab OT Goals Patient Stated Goal: not stated OT Goal Formulation: With patient Time For Goal Achievement: 07/09/15 Potential to Achieve Goals: Good ADL Goals Pt Will Perform Lower Body Dressing: with set-up;with supervision;sit to/from stand (with or without AE) Pt Will Transfer to Toilet: with modified independence;ambulating Pt Will Perform Toileting - Clothing Manipulation and hygiene: with modified independence;sit to/from stand Pt Will Perform Tub/Shower Transfer: Tub transfer;with supervision;ambulating;with set-up;3 in 1 Additional ADL Goal #1: Pt will independently verbalize 3/3 back precautions and maintain during session during functional mobility and  ADLs. Additional ADL Goal #2: Pt will don/doff back brace with setup assist.  Plan Discharge plan remains appropriate    Co-evaluation                 End of Session Equipment Utilized During Treatment: Back brace;Other (comment) (AE)   Activity Tolerance Patient tolerated treatment well   Patient Left in bed;with call bell/phone within reach;Other (comment) (lady to talk about setting up [facility])   Nurse Communication          [id number] OT Time Calculation (min): 19 min  Charges: OT General Charges $OT Visit: 1 Procedure OT Treatments $Self Care/Home Management : 8-22 mins  Benito Mccreedy OTR/L [id number] 07/03/2015, 10:55 AM

## 2015-07-03 NOTE — Consult Note (Addendum)
WOC wound consult note Reason for Consult: Consult requested for Vac dressing change to back wound.  Dr Hal Neer at bedside to assess wound during first post-op dressing change. Previous Vac dressing was leaking and alarming upon entering the room this am. Wound type: Full thickness post-op wound to middle back Measurement: 11X4X7cm Wound bed: Beefy red Drainage (amount, consistency, odor) Mod amt yellow drainage in cannister; 200cc.  Small amt blood-tinged drainage with dressing change. Periwound: Intact skin surrounding, drain to upper back with opsite dressing intact. Dressing procedure/placement/frequency: Applied Mepitel contact layer to decrease discomfort with next dressing change, then one piece of black foam to 160mm cont suction.  Pt medicated for pain with IV  meds prior to procedure but it was extremely painful. Bridged to side of chest to offload pressure from track pad and decrease chance of leakage around tubing. WOC will plan dressing change on Thursday if still in the hospital at that time. Julien Girt MSN, RN, [place], [place], [place]

## 2015-07-03 NOTE — Progress Notes (Signed)
[facility]   Reason for visit: Follow up on deep wound infection  Interval History: continues pain, culture with MSSA. Afebrile.  Pain in back much better. Has picc line already.  Repeat blood cultures sent today.  Echo not yet done.   Physical Exam: Constitutional:  Filed Vitals:   07/03/15 0543 07/03/15 1104  BP: 96/61 131/79  Pulse: 88 110  Temp: 98.2 F (36.8 C) 98.5 F (36.9 C)  Resp: 20 20   patient appears in NAD Respiratory: Normal respiratory effort; CTA B Cardiovascular: RRR Back: wound vac in place  Review of Systems: Constitutional: negative for chills Cardiovascular: negative for chest pain Gastrointestinal: negative for nausea and diarrhea  Lab Results  Component Value Date   WBC 3.1* 07/03/2015   HGB 8.9* 07/03/2015   HCT 27.4* 07/03/2015   MCV 84.8 07/03/2015   PLT 152 07/03/2015    Lab Results  Component Value Date   CREATININE 1.35* 07/03/2015   BUN 7 07/03/2015   NA 134* 07/03/2015   K 3.1* 07/03/2015   CL 99* 07/03/2015   CO2 25 07/03/2015    Lab Results  Component Value Date   ALT 11* 06/30/2015   AST 15 06/30/2015   ALKPHOS 48 06/30/2015     Microbiology: Recent Results (from the past 240 hour(s))  Wound culture     Status: None   Collection Time: 06/30/15  2:11 PM  Result Value Ref Range Status   Specimen Description WOUND BACK  Final   Special Requests Normal  Final   Gram Stain   Final    NO WBC SEEN NO SQUAMOUS EPITHELIAL CELLS SEEN FEW GRAM POSITIVE COCCI IN PAIRS Performed at [facility]    Culture   Final    ABUNDANT STAPHYLOCOCCUS AUREUS Note: SUSCEPTIBILITIES PERFORMED ON PREVIOUS CULTURE WITHIN THE LAST 5 DAYS. REFER TO CULTURE [id number] Performed at [facility]    Report Status 07/03/2015 FINAL  Final  Culture, blood (routine x 2)     Status: None   Collection Time: 06/30/15  2:13 PM  Result Value Ref Range Status   Specimen Description BLOOD RIGHT ANTECUBITAL   Final   Special Requests BOTTLES DRAWN AEROBIC AND ANAEROBIC 5CC  Final   Culture  Setup Time   Final    GRAM POSITIVE COCCI IN CLUSTERS IN BOTH AEROBIC AND ANAEROBIC BOTTLES CRITICAL RESULT CALLED TO, READ BACK BY AND VERIFIED WITH: S WHALEY 07/01/15 @ 0939 M VESTAL    Culture   Final    STAPHYLOCOCCUS AUREUS SUSCEPTIBILITIES PERFORMED ON PREVIOUS CULTURE WITHIN THE LAST 5 DAYS.    Report Status 07/03/2015 FINAL  Final  Urine culture     Status: None   Collection Time: 06/30/15  2:19 PM  Result Value Ref Range Status   Specimen Description URINE, CLEAN CATCH  Final   Special Requests Normal  Final   Culture MULTIPLE SPECIES PRESENT, SUGGEST RECOLLECTION  Final   Report Status 07/01/2015 FINAL  Final  Culture, blood (routine x 2)     Status: None   Collection Time: 06/30/15  2:22 PM  Result Value Ref Range Status   Specimen Description BLOOD LEFT ANTECUBITAL  Final   Special Requests BOTTLES DRAWN AEROBIC AND ANAEROBIC 5CC  Final   Culture  Setup Time   Final    GRAM POSITIVE COCCI IN CLUSTERS CRITICAL RESULT CALLED TO, READ BACK BY AND VERIFIED WITH: HENSON,J RN [id number] 1.29.17 MCADOO,G IN BOTH AEROBIC AND ANAEROBIC BOTTLES  Culture STAPHYLOCOCCUS AUREUS  Final   Report Status 07/03/2015 FINAL  Final   Organism ID, Bacteria STAPHYLOCOCCUS AUREUS  Final      Susceptibility   Staphylococcus aureus - MIC*    CIPROFLOXACIN <=0.5 SENSITIVE Sensitive     ERYTHROMYCIN 1 INTERMEDIATE Intermediate     GENTAMICIN <=0.5 SENSITIVE Sensitive     OXACILLIN 0.5 SENSITIVE Sensitive     TETRACYCLINE <=1 SENSITIVE Sensitive     VANCOMYCIN 1 SENSITIVE Sensitive     TRIMETH/SULFA <=10 SENSITIVE Sensitive     CLINDAMYCIN <=0.25 SENSITIVE Sensitive     RIFAMPIN <=0.5 SENSITIVE Sensitive     Inducible Clindamycin NEGATIVE Sensitive     * STAPHYLOCOCCUS AUREUS  Anaerobic culture     Status: None (Preliminary result)   Collection Time: 07/01/15 12:17 AM  Result Value Ref Range Status   Specimen  Description ABSCESS  Final   Special Requests LUMBAR  EPIDURAL  Final   Gram Stain   Final    RARE WBC PRESENT, PREDOMINANTLY PMN NO SQUAMOUS EPITHELIAL CELLS SEEN FEW GRAM POSITIVE COCCI IN CLUSTERS Performed at [facility]    Culture PENDING  Incomplete   Report Status PENDING  Incomplete  Fungus Culture with Smear     Status: None (Preliminary result)   Collection Time: 07/01/15 12:19 AM  Result Value Ref Range Status   Specimen Description ABSCESS  Final   Special Requests LUMBAR EPIDURAL NO 3   Final   Fungal Smear   Final    NO YEAST OR FUNGAL ELEMENTS SEEN Performed at [facility]    Culture   Final    CULTURE IN PROGRESS FOR FOUR WEEKS Performed at [facility]    Report Status PENDING  Incomplete  Gram stain     Status: None   Collection Time: 07/01/15 12:20 AM  Result Value Ref Range Status   Specimen Description ABSCESS  Final   Special Requests LUMBAR EPIDURAL NO 3  Final   Gram Stain   Final    FEW WBC PRESENT,BOTH PMN AND MONONUCLEAR RARE GRAM POSITIVE COCCI IN PAIRS CRITICAL RESULT CALLED TO, READ BACK BY AND VERIFIED WITH: TEMBO,D RN [id number] 1.29.17 MCADOO,G    Report Status 07/01/2015 FINAL  Final  AFB culture with smear (NOT at [facility])     Status: None (Preliminary result)   Collection Time: 07/01/15 12:21 AM  Result Value Ref Range Status   Specimen Description ABSCESS  Final   Special Requests LUMBAR EPIDURAL NO 3  Final   Acid Fast Smear   Final    NO ACID FAST BACILLI SEEN Performed at [facility]    Culture   Final    CULTURE WILL BE EXAMINED FOR 6 WEEKS BEFORE ISSUING A FINAL REPORT Performed at [facility]    Report Status PENDING  Incomplete  Culture, routine-abscess     Status: None   Collection Time: 07/01/15 12:21 AM  Result Value Ref Range Status   Specimen Description ABSCESS  Final   Special Requests LUMBAR EPIDURAL NO 3  Final   Gram Stain   Final    RARE WBC PRESENT, PREDOMINANTLY PMN NO  SQUAMOUS EPITHELIAL CELLS SEEN FEW GRAM POSITIVE COCCI IN CLUSTERS Performed at [facility]    Culture   Final    FEW STAPHYLOCOCCUS AUREUS Note: SUSCEPTIBILITIES PERFORMED ON PREVIOUS CULTURE WITHIN THE LAST 5 DAYS. REFER TO CULTURE [id number] Performed at [facility]    Report Status 07/03/2015 FINAL  Final  Anaerobic culture     Status: None (Preliminary result)   Collection Time: 07/01/15 12:23 AM  Result Value Ref Range Status   Specimen Description ABSCESS  Final   Special Requests LUMBAR DEEP NO 2  Final   Gram Stain   Final    FEW WBC PRESENT, PREDOMINANTLY PMN NO SQUAMOUS EPITHELIAL CELLS SEEN FEW GRAM POSITIVE COCCI IN PAIRS IN CLUSTERS Performed at [facility]    Culture PENDING  Incomplete   Report Status PENDING  Incomplete  Fungus Culture with Smear     Status: None (Preliminary result)   Collection Time: 07/01/15 12:24 AM  Result Value Ref Range Status   Specimen Description ABSCESS  Final   Special Requests LUMBAR DEEP NO 2  Final   Fungal Smear   Final    NO YEAST OR FUNGAL ELEMENTS SEEN Performed at [facility]    Culture   Final    CULTURE IN PROGRESS FOR FOUR WEEKS Performed at [facility]    Report Status PENDING  Incomplete  Gram stain     Status: None   Collection Time: 07/01/15 12:24 AM  Result Value Ref Range Status   Specimen Description ABSCESS  Final   Special Requests LUMBAR DEEP NO 2  Final   Gram Stain   Final    RARE GRAM POSITIVE COCCI IN CLUSTERS FEW WBC PRESENT,BOTH PMN AND MONONUCLEAR CRITICAL RESULT CALLED TO, READ BACK BY AND VERIFIED WITH: TEMBO,D RN [id number] 1.29.17 MCADOO,G    Report Status 07/01/2015 FINAL  Final  AFB culture with smear (NOT at [facility])     Status: None (Preliminary result)   Collection Time: 07/01/15 12:25 AM  Result Value Ref Range Status   Specimen Description ABSCESS  Final   Special Requests LUMBAR DEEP NO 2  Final   Acid Fast Smear   Final    NO ACID FAST BACILLI  SEEN Performed at [facility]    Culture   Final    CULTURE WILL BE EXAMINED FOR 6 WEEKS BEFORE ISSUING A FINAL REPORT Performed at [facility]    Report Status PENDING  Incomplete  Culture, routine-abscess     Status: None   Collection Time: 07/01/15 12:26 AM  Result Value Ref Range Status   Specimen Description ABSCESS  Final   Special Requests LUMBAR DEEP NO 2  Final   Gram Stain   Final    FEW WBC PRESENT, PREDOMINANTLY PMN NO SQUAMOUS EPITHELIAL CELLS SEEN FEW GRAM POSITIVE COCCI IN PAIRS IN CLUSTERS Performed at [facility]    Culture   Final    ABUNDANT STAPHYLOCOCCUS AUREUS Note: RIFAMPIN AND GENTAMICIN SHOULD NOT BE USED AS SINGLE DRUGS FOR TREATMENT OF STAPH INFECTIONS. This organism DOES NOT demonstrate inducible Clindamycin resistance in vitro. Performed at [facility]    Report Status 07/03/2015 FINAL  Final   Organism ID, Bacteria STAPHYLOCOCCUS AUREUS  Final      Susceptibility   Staphylococcus aureus - MIC*    CLINDAMYCIN <=0.25 SENSITIVE Sensitive     ERYTHROMYCIN >=8 RESISTANT Resistant     GENTAMICIN <=0.5 SENSITIVE Sensitive     LEVOFLOXACIN 0.25 SENSITIVE Sensitive     OXACILLIN 1 SENSITIVE Sensitive     RIFAMPIN <=0.5 SENSITIVE Sensitive     TRIMETH/SULFA <=10 SENSITIVE Sensitive     VANCOMYCIN 1 SENSITIVE Sensitive     TETRACYCLINE <=1 SENSITIVE Sensitive     MOXIFLOXACIN <=0.25 SENSITIVE Sensitive     * ABUNDANT STAPHYLOCOCCUS  AUREUS  Anaerobic culture     Status: None (Preliminary result)   Collection Time: 07/01/15 12:28 AM  Result Value Ref Range Status   Specimen Description ABSCESS  Final   Special Requests LUMBAR SUPERFICIAL NO 1  Final   Gram Stain   Final    RARE WBC PRESENT, PREDOMINANTLY PMN NO SQUAMOUS EPITHELIAL CELLS SEEN NO ORGANISMS SEEN Performed at [facility]    Culture PENDING  Incomplete   Report Status PENDING  Incomplete  Fungus Culture with Smear     Status: None  (Preliminary result)   Collection Time: 07/01/15 12:28 AM  Result Value Ref Range Status   Specimen Description ABSCESS  Final   Special Requests LUMBAR SUPERFICIAL NO 1  Final   Fungal Smear   Final    NO YEAST OR FUNGAL ELEMENTS SEEN Performed at [facility]    Culture   Final    CULTURE IN PROGRESS FOR FOUR WEEKS Performed at [facility]    Report Status PENDING  Incomplete  Gram stain     Status: None   Collection Time: 07/01/15 12:29 AM  Result Value Ref Range Status   Specimen Description ABSCESS  Final   Special Requests LUMBAR SUPERFICIAL NO 1  Final   Gram Stain   Final    NO ORGANISMS SEEN FEW WBC PRESENT,BOTH PMN AND MONONUCLEAR    Report Status 07/01/2015 FINAL  Final  AFB culture with smear (NOT at [facility])     Status: None (Preliminary result)   Collection Time: 07/01/15 12:30 AM  Result Value Ref Range Status   Specimen Description ABSCESS  Final   Special Requests LUMBAR SUPERFICIAL NO 1  Final   Acid Fast Smear   Final    NO ACID FAST BACILLI SEEN Performed at [facility]    Culture   Final    CULTURE WILL BE EXAMINED FOR 6 WEEKS BEFORE ISSUING A FINAL REPORT Performed at [facility]    Report Status PENDING  Incomplete  Culture, routine-abscess     Status: None   Collection Time: 07/01/15 12:30 AM  Result Value Ref Range Status   Specimen Description ABSCESS  Final   Special Requests LUMBAR SUPERFICIAL NO 1  Final   Gram Stain   Final    RARE WBC PRESENT, PREDOMINANTLY PMN NO SQUAMOUS EPITHELIAL CELLS SEEN NO ORGANISMS SEEN Performed at [facility]    Culture   Final    FEW STAPHYLOCOCCUS AUREUS Note: SUSCEPTIBILITIES PERFORMED ON PREVIOUS CULTURE WITHIN THE LAST 5 DAYS. REFER TO CULTURE [id number] Performed at [facility]    Report Status 07/03/2015 FINAL  Final  Culture, blood (routine x 2)     Status: None (Preliminary result)   Collection Time: 07/02/15  8:38 AM  Result Value Ref Range Status    Specimen Description BLOOD LEFT ANTECUBITAL  Final   Special Requests BOTTLES DRAWN AEROBIC ONLY  5CC  Final   Culture NO GROWTH 1 DAY  Final   Report Status PENDING  Incomplete  Culture, blood (routine x 2)     Status: None (Preliminary result)   Collection Time: 07/02/15  8:46 AM  Result Value Ref Range Status   Specimen Description BLOOD LEFT ANTECUBITAL  Final   Special Requests BOTTLES DRAWN AEROBIC AND ANAEROBIC 5 CC   Final   Culture NO GROWTH 1 DAY  Final   Report Status PENDING  Incomplete    Impression/Plan:  1. Post op wound infection -  Staph aureus, methicillin sensitive.  Treat for 6 weeks with IV cefazolin 2 grams every 8 hours through March 13th. Antibiotics per home health protocol Weekly labs to [facility]  2. Staph aureus bacteremia - secondary to #1.  Waiting for TTE.  Repeat cultures sent and ngtd.

## 2015-07-03 NOTE — Progress Notes (Signed)
Physical Therapy Treatment Patient Details Name: Cheryl Randall MRN: [id number] DOB: Sep 28, 1975 Today's Date: 07/03/2015    History of Present Illness 40 y.o. s/p Exploration of lumbar wound, irrigation and debridement, explantation of bone growth stimulator, placement of wound vac on 1/29. Pt with recent back fusion (about 1 month ago). PMH includes DDD, PONV, migraine, anxiety, borderline diabetes complication, previous back surgeries.    PT Comments    Pt progressing towards physical therapy goals. Continues to require increased cues for maintenance for back precautions. Will continue to follow and progress as able per POC.   Follow Up Recommendations  No PT follow up     Equipment Recommendations  3in1 (PT)    Recommendations for Other Services       Precautions / Restrictions Precautions Precautions: Fall;Back Precaution Booklet Issued: No Precaution Comments: reviewed back precautions; pt has wound vac Required Braces or Orthoses: Spinal Brace Spinal Brace: Lumbar corset;Applied in sitting position Restrictions Weight Bearing Restrictions: No    Mobility  Bed Mobility Overal bed mobility: Needs Assistance Bed Mobility: Rolling;Sidelying to Sit;Sit to Sidelying Rolling: Supervision Sidelying to sit: Supervision     Sit to sidelying: Supervision General bed mobility comments: VC's for maintenance of back precautions.   Transfers Overall transfer level: Needs assistance Equipment used: None Transfers: Sit to/from Stand Sit to Stand: Supervision         General transfer comment: Supervision for safety. Pt was able to power-up to full standing without assistance.   Ambulation/Gait Ambulation/Gait assistance: Supervision Ambulation Distance (Feet): 300 Feet Assistive device: None (Pushed IV pole) Gait Pattern/deviations: Step-through pattern;Decreased stride length;Trunk flexed Gait velocity: Decreased Gait velocity interpretation: Below normal speed for  age/gender General Gait Details: Pain and fatogue limiting factors. Pt was able to complete gait training without assistance.    Stairs            Wheelchair Mobility    Modified Rankin (Stroke Patients Only)       Balance Overall balance assessment: No apparent balance deficits (not formally assessed)                                  Cognition Arousal/Alertness: Awake/alert Behavior During Therapy: WFL for tasks assessed/performed Overall Cognitive Status: Within Functional Limits for tasks assessed       Memory: Decreased recall of precautions              Exercises      General Comments        Pertinent Vitals/Pain Pain Assessment: Faces Pain Score: 6  Faces Pain Scale: Hurts even more Pain Location: back Pain Descriptors / Indicators: Operative site guarding Pain Intervention(s): Limited activity within patient's tolerance;Monitored during session;Repositioned    Home Living                      Prior Function            PT Goals (current goals can now be found in the care plan section) Acute Rehab PT Goals Patient Stated Goal: not stated PT Goal Formulation: With patient Time For Goal Achievement: 07/09/15 Potential to Achieve Goals: Good Progress towards PT goals: Progressing toward goals    Frequency  Min 3X/week    PT Plan Current plan remains appropriate    Co-evaluation             End of Session Equipment Utilized During Treatment: Back brace Activity Tolerance:  Patient tolerated treatment well Patient left: in bed;with call bell/phone within reach     Time: 1334-1356 PT Time Calculation (min) (ACUTE ONLY): 22 min  Charges:  $Gait Training: 8-22 mins                    G Codes:      Rolinda Roan 16-Jul-2015, 2:24 PM   Rolinda Roan, PT, DPT Acute Rehabilitation Services Pager: [phone]

## 2015-07-03 NOTE — Progress Notes (Signed)
Plan is for patient to discharge to her mothers house with home health services. No CSW services requested at this time. CSW to sign off.   Lucius Conn, [place] Worker [facility] Ph: [phone]

## 2015-07-04 ENCOUNTER — Ambulatory Visit (HOSPITAL_COMMUNITY): Payer: Self-pay

## 2015-07-04 LAB — BASIC METABOLIC PANEL
ANION GAP: 9 (ref 5–15)
BUN: 6 mg/dL (ref 6–20)
CALCIUM: 8.2 mg/dL — AB (ref 8.9–10.3)
CO2: 27 mmol/L (ref 22–32)
Chloride: 100 mmol/L — ABNORMAL LOW (ref 101–111)
Creatinine, Ser: 1.17 mg/dL — ABNORMAL HIGH (ref 0.44–1.00)
GFR, EST NON AFRICAN AMERICAN: 57 mL/min — AB (ref >60)
Glucose, Bld: 105 mg/dL — ABNORMAL HIGH (ref 65–99)
Potassium: 3.1 mmol/L — ABNORMAL LOW (ref 3.5–5.1)
SODIUM: 136 mmol/L (ref 135–145)

## 2015-07-04 LAB — GLUCOSE, CAPILLARY
GLUCOSE-CAPILLARY: 109 mg/dL — AB (ref 65–99)
GLUCOSE-CAPILLARY: 141 mg/dL — AB (ref 65–99)
GLUCOSE-CAPILLARY: 105 mg/dL — AB (ref 65–99)
Glucose-Capillary: 134 mg/dL — ABNORMAL HIGH (ref 65–99)

## 2015-07-04 LAB — CBC WITH DIFFERENTIAL/PLATELET
BASOS ABS: 0 10*3/uL (ref 0.0–0.1)
BASOS PCT: 0 %
EOS ABS: 0.2 10*3/uL (ref 0.0–0.7)
EOS PCT: 6 %
HCT: 26.9 % — ABNORMAL LOW (ref 36.0–46.0)
Hemoglobin: 9 g/dL — ABNORMAL LOW (ref 12.0–15.0)
LYMPHS ABS: 0.7 10*3/uL (ref 0.7–4.0)
LYMPHS PCT: 21 %
MCH: 28.3 pg (ref 26.0–34.0)
MCHC: 33.5 g/dL (ref 30.0–36.0)
MCV: 84.6 fL (ref 78.0–100.0)
MONOS PCT: 17 %
Monocytes Absolute: 0.5 10*3/uL (ref 0.1–1.0)
Neutro Abs: 1.7 10*3/uL (ref 1.7–7.7)
Neutrophils Relative %: 56 %
PLATELETS: 142 10*3/uL — AB (ref 150–400)
RBC: 3.18 MIL/uL — AB (ref 3.87–5.11)
RDW: 13.3 % (ref 11.5–15.5)
WBC: 3.1 10*3/uL — ABNORMAL LOW (ref 4.0–10.5)

## 2015-07-04 MED ORDER — PANTOPRAZOLE SODIUM 40 MG PO TBEC
40.0000 mg | DELAYED_RELEASE_TABLET | Freq: Every day | ORAL | Status: DC
  Administered 2015-07-04 – 2015-07-05 (×2): 40 mg via ORAL
  Filled 2015-07-04 (×2): qty 1

## 2015-07-04 NOTE — Progress Notes (Addendum)
[place] for Infectious Disease   Reason for visit: Follow up on deep wound infection  Interval History:  Afebrile.  Pain in back much better.   Echo not yet done.   Physical Exam: Constitutional:  Filed Vitals:   07/04/15 0937 07/04/15 1413  BP: 148/80 124/67  Pulse: 123 61  Temp: 98.4 F (36.9 C) 98.6 F (37 C)  Resp: 18 18   patient appears in NAD Respiratory: Normal respiratory effort; CTA B Cardiovascular: RRR Back: wound vac in place, with 2 drains in place  Review of Systems: Constitutional: negative for chills Cardiovascular: negative for chest pain Gastrointestinal: negative for nausea and diarrhea  Lab Results  Component Value Date   WBC 3.1* 07/04/2015   HGB 9.0* 07/04/2015   HCT 26.9* 07/04/2015   MCV 84.6 07/04/2015   PLT 142* 07/04/2015    Lab Results  Component Value Date   CREATININE 1.17* 07/04/2015   BUN 6 07/04/2015   NA 136 07/04/2015   K 3.1* 07/04/2015   CL 100* 07/04/2015   CO2 27 07/04/2015    Lab Results  Component Value Date   ALT 11* 06/30/2015   AST 15 06/30/2015   ALKPHOS 48 06/30/2015     Microbiology: Recent Results (from the past 240 hour(s))  Wound culture     Status: None   Collection Time: 06/30/15  2:11 PM  Result Value Ref Range Status   Specimen Description WOUND BACK  Final   Special Requests Normal  Final   Gram Stain   Final    NO WBC SEEN NO SQUAMOUS EPITHELIAL CELLS SEEN FEW GRAM POSITIVE COCCI IN PAIRS Performed at [facility]    Culture   Final    ABUNDANT STAPHYLOCOCCUS AUREUS Note: SUSCEPTIBILITIES PERFORMED ON PREVIOUS CULTURE WITHIN THE LAST 5 DAYS. REFER TO CULTURE [id number] Performed at [facility]    Report Status 07/03/2015 FINAL  Final  Culture, blood (routine x 2)     Status: None   Collection Time: 06/30/15  2:13 PM  Result Value Ref Range Status   Specimen Description BLOOD RIGHT ANTECUBITAL  Final   Special Requests BOTTLES DRAWN AEROBIC AND ANAEROBIC 5CC   Final   Culture  Setup Time   Final    GRAM POSITIVE COCCI IN CLUSTERS IN BOTH AEROBIC AND ANAEROBIC BOTTLES CRITICAL RESULT CALLED TO, READ BACK BY AND VERIFIED WITH: S WHALEY 07/01/15 @ 0939 M VESTAL    Culture   Final    STAPHYLOCOCCUS AUREUS SUSCEPTIBILITIES PERFORMED ON PREVIOUS CULTURE WITHIN THE LAST 5 DAYS.    Report Status 07/03/2015 FINAL  Final  Urine culture     Status: None   Collection Time: 06/30/15  2:19 PM  Result Value Ref Range Status   Specimen Description URINE, CLEAN CATCH  Final   Special Requests Normal  Final   Culture MULTIPLE SPECIES PRESENT, SUGGEST RECOLLECTION  Final   Report Status 07/01/2015 FINAL  Final  Culture, blood (routine x 2)     Status: None   Collection Time: 06/30/15  2:22 PM  Result Value Ref Range Status   Specimen Description BLOOD LEFT ANTECUBITAL  Final   Special Requests BOTTLES DRAWN AEROBIC AND ANAEROBIC 5CC  Final   Culture  Setup Time   Final    GRAM POSITIVE COCCI IN CLUSTERS CRITICAL RESULT CALLED TO, READ BACK BY AND VERIFIED WITH: HENSON,J RN 1884 1.29.17 MCADOO,G IN BOTH AEROBIC AND ANAEROBIC BOTTLES    Culture STAPHYLOCOCCUS AUREUS  Final  Report Status 07/03/2015 FINAL  Final   Organism ID, Bacteria STAPHYLOCOCCUS AUREUS  Final      Susceptibility   Staphylococcus aureus - MIC*    CIPROFLOXACIN <=0.5 SENSITIVE Sensitive     ERYTHROMYCIN 1 INTERMEDIATE Intermediate     GENTAMICIN <=0.5 SENSITIVE Sensitive     OXACILLIN 0.5 SENSITIVE Sensitive     TETRACYCLINE <=1 SENSITIVE Sensitive     VANCOMYCIN 1 SENSITIVE Sensitive     TRIMETH/SULFA <=10 SENSITIVE Sensitive     CLINDAMYCIN <=0.25 SENSITIVE Sensitive     RIFAMPIN <=0.5 SENSITIVE Sensitive     Inducible Clindamycin NEGATIVE Sensitive     * STAPHYLOCOCCUS AUREUS  Anaerobic culture     Status: None (Preliminary result)   Collection Time: 07/01/15 12:17 AM  Result Value Ref Range Status   Specimen Description ABSCESS  Final   Special Requests LUMBAR  EPIDURAL   Final   Gram Stain   Final    RARE WBC PRESENT, PREDOMINANTLY PMN NO SQUAMOUS EPITHELIAL CELLS SEEN FEW GRAM POSITIVE COCCI IN CLUSTERS Performed at [facility]    Culture   Final    NO ANAEROBES ISOLATED; CULTURE IN PROGRESS FOR 5 DAYS Performed at [facility]    Report Status PENDING  Incomplete  Fungus Culture with Smear     Status: None (Preliminary result)   Collection Time: 07/01/15 12:19 AM  Result Value Ref Range Status   Specimen Description ABSCESS  Final   Special Requests LUMBAR EPIDURAL NO 3   Final   Fungal Smear   Final    NO YEAST OR FUNGAL ELEMENTS SEEN Performed at [facility]    Culture   Final    CULTURE IN PROGRESS FOR FOUR WEEKS Performed at [facility]    Report Status PENDING  Incomplete  Gram stain     Status: None   Collection Time: 07/01/15 12:20 AM  Result Value Ref Range Status   Specimen Description ABSCESS  Final   Special Requests LUMBAR EPIDURAL NO 3  Final   Gram Stain   Final    FEW WBC PRESENT,BOTH PMN AND MONONUCLEAR RARE GRAM POSITIVE COCCI IN PAIRS CRITICAL RESULT CALLED TO, READ BACK BY AND VERIFIED WITH: TEMBO,D RN 0221 1.29.17 MCADOO,G    Report Status 07/01/2015 FINAL  Final  AFB culture with smear (NOT at [facility])     Status: None (Preliminary result)   Collection Time: 07/01/15 12:21 AM  Result Value Ref Range Status   Specimen Description ABSCESS  Final   Special Requests LUMBAR EPIDURAL NO 3  Final   Acid Fast Smear   Final    NO ACID FAST BACILLI SEEN Performed at [facility]    Culture   Final    CULTURE WILL BE EXAMINED FOR 6 WEEKS BEFORE ISSUING A FINAL REPORT Performed at [facility]    Report Status PENDING  Incomplete  Culture, routine-abscess     Status: None   Collection Time: 07/01/15 12:21 AM  Result Value Ref Range Status   Specimen Description ABSCESS  Final   Special Requests LUMBAR EPIDURAL NO 3  Final   Gram Stain   Final    RARE WBC PRESENT,  PREDOMINANTLY PMN NO SQUAMOUS EPITHELIAL CELLS SEEN FEW GRAM POSITIVE COCCI IN CLUSTERS Performed at [facility]    Culture   Final    FEW STAPHYLOCOCCUS AUREUS Note: SUSCEPTIBILITIES PERFORMED ON PREVIOUS CULTURE WITHIN THE LAST 5 DAYS. REFER TO CULTURE [id number] Performed at [facility]    Report Status 07/03/2015 FINAL  Final  Anaerobic culture     Status: None (Preliminary result)   Collection Time: 07/01/15 12:23 AM  Result Value Ref Range Status   Specimen Description ABSCESS  Final   Special Requests LUMBAR DEEP NO 2  Final   Gram Stain   Final    FEW WBC PRESENT, PREDOMINANTLY PMN NO SQUAMOUS EPITHELIAL CELLS SEEN FEW GRAM POSITIVE COCCI IN PAIRS IN CLUSTERS Performed at [facility]    Culture   Final    NO ANAEROBES ISOLATED; CULTURE IN PROGRESS FOR 5 DAYS Performed at [facility]    Report Status PENDING  Incomplete  Fungus Culture with Smear     Status: None (Preliminary result)   Collection Time: 07/01/15 12:24 AM  Result Value Ref Range Status   Specimen Description ABSCESS  Final   Special Requests LUMBAR DEEP NO 2  Final   Fungal Smear   Final    NO YEAST OR FUNGAL ELEMENTS SEEN Performed at [facility]    Culture   Final    CULTURE IN PROGRESS FOR FOUR WEEKS Performed at [facility]    Report Status PENDING  Incomplete  Gram stain     Status: None   Collection Time: 07/01/15 12:24 AM  Result Value Ref Range Status   Specimen Description ABSCESS  Final   Special Requests LUMBAR DEEP NO 2  Final   Gram Stain   Final    RARE GRAM POSITIVE COCCI IN CLUSTERS FEW WBC PRESENT,BOTH PMN AND MONONUCLEAR CRITICAL RESULT CALLED TO, READ BACK BY AND VERIFIED WITH: TEMBO,D RN 0221 1.29.17 MCADOO,G    Report Status 07/01/2015 FINAL  Final  AFB culture with smear (NOT at [facility])     Status: None (Preliminary result)   Collection Time: 07/01/15 12:25 AM  Result Value Ref Range Status   Specimen Description ABSCESS   Final   Special Requests LUMBAR DEEP NO 2  Final   Acid Fast Smear   Final    NO ACID FAST BACILLI SEEN Performed at [facility]    Culture   Final    CULTURE WILL BE EXAMINED FOR 6 WEEKS BEFORE ISSUING A FINAL REPORT Performed at [facility]    Report Status PENDING  Incomplete  Culture, routine-abscess     Status: None   Collection Time: 07/01/15 12:26 AM  Result Value Ref Range Status   Specimen Description ABSCESS  Final   Special Requests LUMBAR DEEP NO 2  Final   Gram Stain   Final    FEW WBC PRESENT, PREDOMINANTLY PMN NO SQUAMOUS EPITHELIAL CELLS SEEN FEW GRAM POSITIVE COCCI IN PAIRS IN CLUSTERS Performed at [facility]    Culture   Final    ABUNDANT STAPHYLOCOCCUS AUREUS Note: RIFAMPIN AND GENTAMICIN SHOULD NOT BE USED AS SINGLE DRUGS FOR TREATMENT OF STAPH INFECTIONS. This organism DOES NOT demonstrate inducible Clindamycin resistance in vitro. Performed at [facility]    Report Status 07/03/2015 FINAL  Final   Organism ID, Bacteria STAPHYLOCOCCUS AUREUS  Final      Susceptibility   Staphylococcus aureus - MIC*    CLINDAMYCIN <=0.25 SENSITIVE Sensitive     ERYTHROMYCIN >=8 RESISTANT Resistant     GENTAMICIN <=0.5 SENSITIVE Sensitive     LEVOFLOXACIN 0.25 SENSITIVE Sensitive     OXACILLIN 1 SENSITIVE Sensitive     RIFAMPIN <=0.5 SENSITIVE Sensitive     TRIMETH/SULFA <=10 SENSITIVE Sensitive  VANCOMYCIN 1 SENSITIVE Sensitive     TETRACYCLINE <=1 SENSITIVE Sensitive     MOXIFLOXACIN <=0.25 SENSITIVE Sensitive     * ABUNDANT STAPHYLOCOCCUS AUREUS  Anaerobic culture     Status: None (Preliminary result)   Collection Time: 07/01/15 12:28 AM  Result Value Ref Range Status   Specimen Description ABSCESS  Final   Special Requests LUMBAR SUPERFICIAL NO 1  Final   Gram Stain   Final    RARE WBC PRESENT, PREDOMINANTLY PMN NO SQUAMOUS EPITHELIAL CELLS SEEN NO ORGANISMS SEEN Performed at [facility]    Culture   Final      NO ANAEROBES ISOLATED; CULTURE IN PROGRESS FOR 5 DAYS Performed at [facility]    Report Status PENDING  Incomplete  Fungus Culture with Smear     Status: None (Preliminary result)   Collection Time: 07/01/15 12:28 AM  Result Value Ref Range Status   Specimen Description ABSCESS  Final   Special Requests LUMBAR SUPERFICIAL NO 1  Final   Fungal Smear   Final    NO YEAST OR FUNGAL ELEMENTS SEEN Performed at [facility]    Culture   Final    CULTURE IN PROGRESS FOR FOUR WEEKS Performed at [facility]    Report Status PENDING  Incomplete  Gram stain     Status: None   Collection Time: 07/01/15 12:29 AM  Result Value Ref Range Status   Specimen Description ABSCESS  Final   Special Requests LUMBAR SUPERFICIAL NO 1  Final   Gram Stain   Final    NO ORGANISMS SEEN FEW WBC PRESENT,BOTH PMN AND MONONUCLEAR    Report Status 07/01/2015 FINAL  Final  AFB culture with smear (NOT at [facility])     Status: None (Preliminary result)   Collection Time: 07/01/15 12:30 AM  Result Value Ref Range Status   Specimen Description ABSCESS  Final   Special Requests LUMBAR SUPERFICIAL NO 1  Final   Acid Fast Smear   Final    NO ACID FAST BACILLI SEEN Performed at [facility]    Culture   Final    CULTURE WILL BE EXAMINED FOR 6 WEEKS BEFORE ISSUING A FINAL REPORT Performed at [facility]    Report Status PENDING  Incomplete  Culture, routine-abscess     Status: None   Collection Time: 07/01/15 12:30 AM  Result Value Ref Range Status   Specimen Description ABSCESS  Final   Special Requests LUMBAR SUPERFICIAL NO 1  Final   Gram Stain   Final    RARE WBC PRESENT, PREDOMINANTLY PMN NO SQUAMOUS EPITHELIAL CELLS SEEN NO ORGANISMS SEEN Performed at [facility]    Culture   Final    FEW STAPHYLOCOCCUS AUREUS Note: SUSCEPTIBILITIES PERFORMED ON PREVIOUS CULTURE WITHIN THE LAST 5 DAYS. REFER TO CULTURE [id number] Performed at [facility]     Report Status 07/03/2015 FINAL  Final  Culture, blood (routine x 2)     Status: None (Preliminary result)   Collection Time: 07/02/15  8:38 AM  Result Value Ref Range Status   Specimen Description BLOOD LEFT ANTECUBITAL  Final   Special Requests BOTTLES DRAWN AEROBIC ONLY  5CC  Final   Culture NO GROWTH 2 DAYS  Final   Report Status PENDING  Incomplete  Culture, blood (routine x 2)     Status: None (Preliminary result)   Collection Time: 07/02/15  8:46 AM  Result Value Ref Range Status   Specimen Description  BLOOD LEFT ANTECUBITAL  Final   Special Requests BOTTLES DRAWN AEROBIC AND ANAEROBIC 5 CC   Final   Culture NO GROWTH 2 DAYS  Final   Report Status PENDING  Incomplete    Impression/Plan:   1. Post op wound infection, deep  - cultures showing MSSA. Plan to treat for 6 weeks with IV cefazolin 2 grams every 8 hours through March 13th. Antibiotics per home health protocol Weekly labs to [facility]  2. Staph aureus bacteremia - secondary to #1.  Waiting for TTE to ensure no signs of endocarditis  Repeat cultures sent and ngtd.   ---------------------------Home health orders listed below---------------------------------  Diagnosis: MSSA deep tissue infection, post op infection  Culture Result: MSSA  Allergies  Allergen Reactions  . Codeine Itching and Rash  . Latex Itching and Rash  . Norco [Hydrocodone-Acetaminophen] Itching and Rash  . Tape Itching and Rash    Discharge antibiotics: Per pharmacy protocol cefazolin  Duration: 6 wk End Date: March 13th  [facility] Care Per Protocol:  Labs weekly while on IV antibiotics: _x CBC with differential _x_ CMP _x_ CRP - on last wk of tx _x_ ESR - on last week of tx   Fax weekly labs to [phone]  Clinic Follow Up Appt: 5-6 wks with Dr Linus Salmons or Niv Darley

## 2015-07-04 NOTE — Progress Notes (Addendum)
Occupational Therapy Treatment Patient Details Name: FELITA BRANON MRN: [id number] DOB: 08/22/1975 Today's Date: 07/04/2015    History of present illness 40 y.o. s/p Exploration of lumbar wound, irrigation and debridement, explantation of bone growth stimulator, placement of wound vac on 1/29. Pt with recent back fusion (about 1 month ago). PMH includes DDD, PONV, migraine, anxiety, borderline diabetes complication, previous back surgeries.   OT comments  Education provided in session. OT signing off. Back handout given to pt.  Follow Up Recommendations  No OT follow up;Supervision/Assistance - 24 hour    Equipment Recommendations  3 in 1 bedside comode    Recommendations for Other Services      Precautions / Restrictions Precautions Precautions: Fall;Back Precaution Booklet Issued: Yes (comment) Precaution Comments: reviewed back precautions; pt has wound vac Required Braces or Orthoses: Spinal Brace Spinal Brace: Lumbar corset;Applied in sitting position Restrictions Weight Bearing Restrictions: No       Mobility Bed Mobility Overal bed mobility: Needs Assistance Bed Mobility: Sit to Sidelying         Sit to sidelying: Supervision;HOB elevated General bed mobility comments: cues for technique.  Transfers Overall transfer level: Needs assistance Equipment used: None Transfers: Sit to/from Stand Sit to Stand: Supervision           Balance      Able to step over simulated tub with UE support without LOB-supervision.                         ADL Overall ADL's : Needs assistance/impaired                 Upper Body Dressing : Minimal assistance;Sitting (back brace)      Lower Body Dressing Details (indicate cue type and reason): pt crossed legs over knees Toilet Transfer: Supervision/safety;Ambulation       Tub/ Shower Transfer: Tub transfer;Supervision/safety;Ambulation   Functional mobility during ADLs: Supervision/safety General ADL  Comments: Discussed incorporating precautions into functional activities. Discussed back brace. Practiced simulated tub transfer. Discussed AE and LB ADL technique.      Vision                     Perception     Praxis      Cognition  Awake/Alert Behavior During Therapy: WFL for tasks assessed/performed Overall Cognitive Status: Within Functional Limits for tasks assessed, however decreased recall of precaution       Memory: Decreased recall of precaution               Extremity/Trunk Assessment               Exercises     Shoulder Instructions       General Comments      Pertinent Vitals/ Pain       Pain Assessment: 0-10 Pain Score: 5  Pain Location: back and legs as well as head Pain Descriptors / Indicators: holding her head Pain Intervention(s): Monitored during session;Repositioned  Home Living                                          Prior Functioning/Environment              Frequency Min 2X/week     Progress Toward Goals  OT Goals(current goals can now be found in the care plan section)  Progress towards  OT goals: Progressing toward goals-adequate for d/c  Acute Rehab OT Goals Patient Stated Goal: not stated OT Goal Formulation: With patient Time For Goal Achievement: 07/09/15 Potential to Achieve Goals: Good  Plan Discharge plan remains appropriate    Co-evaluation                 End of Session Equipment Utilized During Treatment: Back brace   Activity Tolerance Patient tolerated treatment well   Patient Left in bed;with call bell/phone within reach   Nurse Communication          Time: 1353-1411 OT Time Calculation (min): 18 min  Charges: OT General Charges $OT Visit: 1 Procedure OT Treatments $Self Care/Home Management : 8-22 mins  Benito Mccreedy OTR/L [id number] 07/04/2015, 2:23 PM

## 2015-07-04 NOTE — Progress Notes (Signed)
Patient ID: Cheryl Randall, female   DOB: 09-08-1975, 40 y.o.   MRN: [id number] Afeb, vss Increasing activity.  Pain not bad except during wound vac sponge change. She feels she would like to stay until Friday, so please have home health ready to go by then.

## 2015-07-04 NOTE — Progress Notes (Signed)
Physical Therapy Treatment Patient Details Name: Cheryl Randall MRN: [id number] DOB: May 28, 1976 Today's Date: 07/04/2015    History of Present Illness 40 y.o. s/p Exploration of lumbar wound, irrigation and debridement, explantation of bone growth stimulator, placement of wound vac on 1/29. Pt with recent back fusion (about 1 month ago). PMH includes DDD, PONV, migraine, anxiety, borderline diabetes complication, previous back surgeries.    PT Comments    Pt progressing towards physical therapy goals. From a mobility standpoint, pt is steady and able to ambulate well without UE support. Continues to require assistance for brace application and management of hemovac and wound vac. Will need to complete stair training prior to d/c.  Follow Up Recommendations  No PT follow up     Equipment Recommendations  3in1 (PT)    Recommendations for Other Services       Precautions / Restrictions Precautions Precautions: Fall;Back Precaution Booklet Issued: No Precaution Comments: reviewed back precautions; pt has wound vac Required Braces or Orthoses: Spinal Brace Spinal Brace: Lumbar corset;Applied in sitting position (Pad placed between wound and brace for comfort) Restrictions Weight Bearing Restrictions: No    Mobility  Bed Mobility               General bed mobility comments: Pt sitting up EOB when PT arrived.  Transfers Overall transfer level: Needs assistance Equipment used: None Transfers: Sit to/from Stand Sit to Stand: Supervision         General transfer comment: Supervision for safety. Pt was able to power-up to full standing without assistance.   Ambulation/Gait Ambulation/Gait assistance: Supervision Ambulation Distance (Feet): 500 Feet Assistive device: None Gait Pattern/deviations: Step-through pattern;Decreased stride length Gait velocity: Decreased Gait velocity interpretation: Below normal speed for age/gender General Gait Details: Therapist managed  IV pole/wound vac this session and pt had no UE support while ambulating.    Stairs            Wheelchair Mobility    Modified Rankin (Stroke Patients Only)       Balance Overall balance assessment: No apparent balance deficits (not formally assessed)                                  Cognition Arousal/Alertness: Awake/alert Behavior During Therapy: WFL for tasks assessed/performed Overall Cognitive Status: Within Functional Limits for tasks assessed       Memory: Decreased recall of precautions              Exercises      General Comments        Pertinent Vitals/Pain Pain Assessment: Faces Faces Pain Scale: Hurts whole lot Pain Location: Incision/wound - with brace donned.  Pain Descriptors / Indicators: Operative site guarding Pain Intervention(s): Limited activity within patient's tolerance;Monitored during session;Repositioned    Home Living                      Prior Function            PT Goals (current goals can now be found in the care plan section) Acute Rehab PT Goals Patient Stated Goal: not stated PT Goal Formulation: With patient Time For Goal Achievement: 07/09/15 Potential to Achieve Goals: Good Progress towards PT goals: Progressing toward goals    Frequency  Min 3X/week    PT Plan Current plan remains appropriate    Co-evaluation  End of Session Equipment Utilized During Treatment: Back brace Activity Tolerance: Patient tolerated treatment well Patient left: in chair;with call bell/phone within reach     Time: 1130-1153 PT Time Calculation (min) (ACUTE ONLY): 23 min  Charges:  $Gait Training: 23-37 mins                    G Codes:      Rolinda Roan 07-30-15, 1:16 PM   Rolinda Roan, PT, DPT Acute Rehabilitation Services Pager: [phone]

## 2015-07-04 NOTE — Progress Notes (Signed)
Dr Hal Neer rounding on pt this am, notified of pt's K+ level 3.1 this am. Asked if pt on any meds to lower K+ level, pt not on any at this time, states he will watch her for now. No orders given at this time.

## 2015-07-05 ENCOUNTER — Inpatient Hospital Stay (HOSPITAL_COMMUNITY): Payer: Self-pay

## 2015-07-05 DIAGNOSIS — A4101 Sepsis due to Methicillin susceptible Staphylococcus aureus: Secondary | ICD-10-CM | POA: Insufficient documentation

## 2015-07-05 DIAGNOSIS — R509 Fever, unspecified: Secondary | ICD-10-CM

## 2015-07-05 DIAGNOSIS — T814XXS Infection following a procedure, sequela: Secondary | ICD-10-CM

## 2015-07-05 LAB — GLUCOSE, CAPILLARY
GLUCOSE-CAPILLARY: 110 mg/dL — AB (ref 65–99)
GLUCOSE-CAPILLARY: 144 mg/dL — AB (ref 65–99)

## 2015-07-05 LAB — CBC WITH DIFFERENTIAL/PLATELET
Basophils Absolute: 0 10*3/uL (ref 0.0–0.1)
Basophils Relative: 1 %
EOS PCT: 6 %
Eosinophils Absolute: 0.3 10*3/uL (ref 0.0–0.7)
HCT: 27.2 % — ABNORMAL LOW (ref 36.0–46.0)
Hemoglobin: 9 g/dL — ABNORMAL LOW (ref 12.0–15.0)
LYMPHS ABS: 0.9 10*3/uL (ref 0.7–4.0)
LYMPHS PCT: 22 %
MCH: 27.9 pg (ref 26.0–34.0)
MCHC: 33.1 g/dL (ref 30.0–36.0)
MCV: 84.2 fL (ref 78.0–100.0)
MONO ABS: 0.7 10*3/uL (ref 0.1–1.0)
MONOS PCT: 18 %
Neutro Abs: 2.2 10*3/uL (ref 1.7–7.7)
Neutrophils Relative %: 54 %
PLATELETS: 153 10*3/uL (ref 150–400)
RBC: 3.23 MIL/uL — ABNORMAL LOW (ref 3.87–5.11)
RDW: 13.3 % (ref 11.5–15.5)
WBC: 4.2 10*3/uL (ref 4.0–10.5)

## 2015-07-05 LAB — BASIC METABOLIC PANEL
Anion gap: 11 (ref 5–15)
CALCIUM: 8.3 mg/dL — AB (ref 8.9–10.3)
CO2: 28 mmol/L (ref 22–32)
Chloride: 98 mmol/L — ABNORMAL LOW (ref 101–111)
Creatinine, Ser: 1.1 mg/dL — ABNORMAL HIGH (ref 0.44–1.00)
GFR calc Af Amer: >60 mL/min (ref >60)
GLUCOSE: 127 mg/dL — AB (ref 65–99)
POTASSIUM: 3 mmol/L — AB (ref 3.5–5.1)
Sodium: 137 mmol/L (ref 135–145)

## 2015-07-05 MED ORDER — HYDROMORPHONE HCL 1 MG/ML IJ SOLN
2.0000 mg | Freq: Once | INTRAMUSCULAR | Status: AC
  Administered 2015-07-05: 2 mg via INTRAVENOUS
  Filled 2015-07-05: qty 2

## 2015-07-05 NOTE — Progress Notes (Signed)
Echo completed. No signs of vegetation. abtx recs unchanged from yesterdays notes. We will see her back in the ID clinic in 5 wk  Zavion Sleight [facility] [phone]

## 2015-07-05 NOTE — Consult Note (Signed)
WOC wound consult note Reason for Consult: Vac dressing change to back wound.Previous Vac dressing was leaking and alarming upon entering the room this am. Wound type: Full thickness post-op wound to middle back Wound bed: Beefy red; appearance unchanged from previous assessment Drainage (amount, consistency, odor) Mod amt yellow drainage in cannister Periwound: Intact skin surrounding, drain to upper back with opsite dressing intact. Dressing procedure/placement/frequency:  Mepitel contact layer greatly decreased discomfort with this dressing change, then applied one piece of black foam to 190mm cont suction. Pt medicated for pain with IV meds prior to procedure and she tolerated it with minimal discomfort. Bridged to side of chest to offload pressure from track pad and decrease chance of leakage around tubing. Pt plans to discharge on Fri and have dressing changes performed by home health. Julien Girt MSN, RN, [place], [place], [place]

## 2015-07-05 NOTE — Progress Notes (Signed)
  Echocardiogram 2D Echocardiogram has been performed.  Diamond Nickel 07/05/2015, 8:49 AM

## 2015-07-05 NOTE — Progress Notes (Signed)
Patient ID: Cheryl Randall, female   DOB: 01/24/1976, 40 y.o.   MRN: [id number] Afeb, vss Wound change today was much less painful.  She is sleepy from pain meds. The plan is fior her to go home tomorrow. I am assuming that home health is arranged and will be ready to start her vac changes.

## 2015-07-05 NOTE — Progress Notes (Signed)
PT Cancellation Note  Patient Details Name: Cheryl Randall MRN: [id number] DOB: [birth year]   Cancelled Treatment:    Reason Eval/Treat Not Completed: Discussed pt's current level of function and PT goals. Pt is only requiring supervision for lines (IV and wound vac) and is overall modified independent for mobility. Pt declining stair training, stating she feels confident entering her mother's home at d/c and has not had issues with the stairs in the past. Therapist reviewed back precautions with pt and encouraged ambulation with nursing during the day. Pt states she feels comfortable discharging from therapy and does not wish for PT follow up at d/c. Will sign off at this time. If needs change, please reconsult.     Rolinda Roan 07/05/2015, 11:59 AM   Rolinda Roan, PT, DPT Acute Rehabilitation Services Pager: [phone]

## 2015-07-05 NOTE — Progress Notes (Signed)
Pt called RN to room to show small amt of leakage from wound vac drsg. Wound vac was not beeping. Pt is due to have drsg changed today by wocn but offered to change drsg now stating it may still have to be changed later also by wound nurse and pt stated it is too painful to go through for 2 times. Therefore, abd pad applied at bottom of vac drsg to back. Machine never beeped. Also, pt is requesting a higher dose of pain med today prior to drsg change-a one time order. Hemovac noted to not be holding suction very well.

## 2015-07-06 LAB — ANAEROBIC CULTURE

## 2015-07-06 LAB — CBC WITH DIFFERENTIAL/PLATELET
BASOS ABS: 0 10*3/uL (ref 0.0–0.1)
Basophils Relative: 1 %
Eosinophils Absolute: 0.3 10*3/uL (ref 0.0–0.7)
Eosinophils Relative: 5 %
HEMATOCRIT: 28.1 % — AB (ref 36.0–46.0)
HEMOGLOBIN: 9.3 g/dL — AB (ref 12.0–15.0)
LYMPHS PCT: 17 %
Lymphs Abs: 1.1 10*3/uL (ref 0.7–4.0)
MCH: 28.1 pg (ref 26.0–34.0)
MCHC: 33.1 g/dL (ref 30.0–36.0)
MCV: 84.9 fL (ref 78.0–100.0)
MONO ABS: 0.8 10*3/uL (ref 0.1–1.0)
Monocytes Relative: 12 %
NEUTROS ABS: 4.3 10*3/uL (ref 1.7–7.7)
NEUTROS PCT: 65 %
Platelets: 187 10*3/uL (ref 150–400)
RBC: 3.31 MIL/uL — ABNORMAL LOW (ref 3.87–5.11)
RDW: 13.4 % (ref 11.5–15.5)
WBC: 6.5 10*3/uL (ref 4.0–10.5)

## 2015-07-06 LAB — BASIC METABOLIC PANEL
ANION GAP: 8 (ref 5–15)
BUN: <5 mg/dL — ABNORMAL LOW (ref 6–20)
CALCIUM: 8.4 mg/dL — AB (ref 8.9–10.3)
CHLORIDE: 100 mmol/L — AB (ref 101–111)
CO2: 30 mmol/L (ref 22–32)
Creatinine, Ser: 1.04 mg/dL — ABNORMAL HIGH (ref 0.44–1.00)
GFR calc non Af Amer: >60 mL/min (ref >60)
GLUCOSE: 110 mg/dL — AB (ref 65–99)
POTASSIUM: 3.3 mmol/L — AB (ref 3.5–5.1)
Sodium: 138 mmol/L (ref 135–145)

## 2015-07-06 MED ORDER — HYDROMORPHONE HCL 4 MG PO TABS: 4.0000 mg | ORAL_TABLET | ORAL | Status: DC | PRN

## 2015-07-06 MED ORDER — HEPARIN SOD (PORK) LOCK FLUSH 100 UNIT/ML IV SOLN
250.0000 [IU] | INTRAVENOUS | Status: AC | PRN
  Administered 2015-07-06: 250 [IU]

## 2015-07-06 NOTE — Progress Notes (Signed)
Patient is discharging home today with [facility] for wound vac and IV therapy. Patient has [employer] pump for home in her room. Pam with [facility] IV therapy given script for patients home antibiotics. Bedside RN updated.

## 2015-07-06 NOTE — Consult Note (Signed)
WOC wound follow up Wound type: lumbar wound NPWT VAC therapy, leaking overnight.  Plans for DC to home today, need WOC to check and change if needed.  When I arrived [facility] is not sealed and is alarming.  Measurement: 11cm x 4cm x 7cm  Wound bed: a bit slimy, moist, pink, non granular.   Because we are using Mepitel will not see change in tissues as much Drainage (amount, consistency, odor) moderate, serous  Periwound: intact  Dressing procedure/placement/frequency: Lined distal edge of wound with hydrocolloid to aid in seal, since this area is dependent drainage appears to be pooling here.  2pc of black foam used to fill wound bed, bottom of wound bed lined with 1pc of Mepitel per patient request due to pain with dressing removal.   Hooked to home unit for DC today.    Discussed POC with patient and bedside nurse.  Re consult if needed, will not follow at this time. Thanks  Renita Brocks [place], [place] [phone])

## 2015-07-06 NOTE — Progress Notes (Signed)
Patient's wound vac was leaking and with adjustments was able to be sealed again. Will put in [facility] nurse consult before patient goes home. Will continue to monitor. Ruben Gottron, RN 07/06/2015 3:43 AM

## 2015-07-06 NOTE — Progress Notes (Signed)
Patient is being d/c home . Dc instructions given and patient verbalized understanding. Patient left with home wound vac.

## 2015-07-06 NOTE — Discharge Summary (Signed)
  Physician Discharge Summary  Patient ID: Cheryl Randall MRN: [id number] DOB/AGE: [birth year] 41 y.o.  Admit date: 06/30/2015 Discharge date: 07/06/2015  Admission Diagnoses:  Discharge Diagnoses:  Active Problems:   Wound infection ([place])   Deep incisional surgical site infection   Staphylococcus aureus bacteremia with sepsis [facility])   Discharged Condition: fair  Hospital Course: Surgery 5 weeks ago for lumbar fusion. She did well for 1 month, and then suddenly developed fever and draining wound. She was admitted and had I and D of lumbar wound. Wound vac was placed, and she slowly improved. On ancef tid. By day 6 post op, pain good, ambulkating well, wound healing with good granulation. Home with specific instructions given.  Consults: ID  Significant Diagnostic Studies: none  Treatments: surgery: I and D of lumbar wound  Discharge Exam: Blood pressure 114/60, pulse 103, temperature 98.4 F (36.9 C), temperature source Oral, resp. rate 18, height 5\' 7"  (1.702 m), weight 116.574 kg (257 lb), last menstrual period 02/24/2013, SpO2 100 %. Incision/Wound:healing slowly; no new neuro issues  Disposition: 01-Home or Self Care     Medication List    ASK your doctor about these medications        acetaminophen 325 MG tablet  Commonly known as:  TYLENOL  Take 650 mg by mouth every 6 (six) hours as needed for mild pain.     cyclobenzaprine 10 MG tablet  Commonly known as:  FLEXERIL  Take 1 tablet (10 mg total) by mouth 3 (three) times daily as needed for muscle spasms.     HYDROcodone-acetaminophen 7.5-325 MG tablet  Commonly known as:  NORCO  Take 1-2 tablets by mouth every 4 (four) hours as needed for moderate pain.     SUMAtriptan 50 MG tablet  Commonly known as:  IMITREX  Take 50 mg by mouth 2 (two) times daily as needed. Repeat in 4 hours if no relief         At home rest most of the time. Get up 9 or 10 times each day and take a 15 or 20 minute walk. No riding in  the car and to your first postoperative appointment. If you have neck surgery you may shower from the chest down starting on the third postoperative day. If you had back surgery he may start showering on the third postoperative day with saran wrap wrapped around your incisional area 3 times. After the shower remove the saran wrap. Take pain medicine as needed and other medications as instructed. Call my office for an appointment.  SignedFaythe Ghee, MD 07/06/2015, 8:42 AM

## 2015-07-07 LAB — CULTURE, BLOOD (ROUTINE X 2)
CULTURE: NO GROWTH
CULTURE: NO GROWTH

## 2015-07-30 LAB — FUNGUS CULTURE W SMEAR
FUNGAL SMEAR: NONE SEEN
Fungal Smear: NONE SEEN
Fungal Smear: NONE SEEN

## 2015-08-07 ENCOUNTER — Encounter: Payer: Self-pay | Admitting: Internal Medicine

## 2015-08-07 ENCOUNTER — Ambulatory Visit (INDEPENDENT_AMBULATORY_CARE_PROVIDER_SITE_OTHER): Payer: Self-pay | Admitting: Internal Medicine

## 2015-08-07 VITALS — BP 140/84 | HR 112 | Temp 97.4°F | Wt 245.0 lb

## 2015-08-07 DIAGNOSIS — M869 Osteomyelitis, unspecified: Secondary | ICD-10-CM

## 2015-08-07 DIAGNOSIS — M4626 Osteomyelitis of vertebra, lumbar region: Secondary | ICD-10-CM

## 2015-08-07 DIAGNOSIS — A4901 Methicillin susceptible Staphylococcus aureus infection, unspecified site: Secondary | ICD-10-CM

## 2015-08-07 NOTE — Progress Notes (Signed)
RFV: MSSA bacteremia associated with deep tissue- post surgical wound infection Subjective:    Patient ID: Cheryl Randall, female    DOB: 29-Nov-1975, 40 y.o.   MRN: [id number]  HPI Cheryl Randall is a 40 y.o. female with a history of back fusion several years ago then in 2016 began to have left sided weakness and pain. 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 Then on 12/29 she underwent removal of L4-5 transfer set screw fixation, decompressive laminotomy and decompression of lateral recess stenosis and posterolateral fusion with internal bone growth stimulator placement. She then presented to the ED 1/28 with 1 day of fever, chills, pain and drainage that was yellow and purulent. She was found to have MSSA bacteremia in 2 sets of blood cx from admit. She urgently went to the OR for I x D where  purulence and necrotic tissue from superficial area to deep edges. TEE was negative for any vegetation, but still was discharged on 6 wk of cefazolin to end on March 13th. However the patient reports that she is on vancomycin. While in the hospital she had significant nausea nad vomiting that was attributed to cefazolin thus she was discharged on vancomycin. She is on her 5th of 6th week of treatment  Her wound in the hospital measured 11cm x 4cm x 7cm. Now it is 8cm in length x 1.4cm depth x 1.6cm width. Pink with good granulation tissue. The wet to dry dressing applied yesterday has some yellowish exudate on it. She had wound vac removed yesterday. She reports that the unit is working but no fluid has been pulled through the system. She is desperate to have the wound vac removed. She is tearful to how much difficulty she had with the wound vac  She states that dr. Hal Neer had referred her to the wound care center for further management of the wound vac.  No difficulty with the wound vac, nor picc line. No diarrhea or thrush. picc line is  not tender  Allergies  Allergen Reactions  . Codeine Itching and Rash  . Latex Itching and Rash  . Norco [Hydrocodone-Acetaminophen] Itching and Rash  . Tape Itching and Rash   Current Outpatient Prescriptions on File Prior to Visit  Medication Sig Dispense Refill  . acetaminophen (TYLENOL) 325 MG tablet Take 650 mg by mouth every 6 (six) hours as needed for mild pain.    . cyclobenzaprine (FLEXERIL) 10 MG tablet Take 1 tablet (10 mg total) by mouth 3 (three) times daily as needed for muscle spasms. 30 tablet 0  . HYDROmorphone (DILAUDID) 4 MG tablet Take 1 tablet (4 mg total) by mouth every 4 (four) hours as needed for severe pain. 75 tablet 0  . SUMAtriptan (IMITREX) 50 MG tablet Take 50 mg by mouth 2 (two) times daily as needed. Repeat in 4 hours if no relief  12   No current facility-administered medications on file prior to visit.   Active Ambulatory Problems    Diagnosis Date Noted  . 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 Wound infection ([place]) 06/30/2015  . Deep incisional surgical site infection 07/01/2015  . Staphylococcus aureus bacteremia with sepsis [facility])    Resolved Ambulatory Problems    Diagnosis Date Noted  . No Resolved Ambulatory Problems   Past Medical History  Diagnosis Date  . DDD (degenerative disc disease)   . Migraine   . PONV (postoperative nausea and vomiting)   . Family  history of adverse reaction to anesthesia   . Anxiety   . Diabetes mellitus without complication [facility])    Social History  Substance Use Topics  . Smoking status: Former Research scientist (life sciences)  . Smokeless tobacco: Never Used  . Alcohol Use: No  family history includes Cancer in her father.     Review of Systems  Constitutional: Negative for fever, chills, diaphoresis, activity change, appetite change, fatigue and unexpected weight change.  HENT: Negative for congestion, sore throat, rhinorrhea, sneezing, trouble swallowing and sinus pressure.  Eyes: Negative for photophobia and  visual disturbance.  Respiratory: Negative for cough, chest tightness, shortness of breath, wheezing and stridor.  Cardiovascular: Negative for chest pain, palpitations and leg swelling.  Gastrointestinal: Negative for nausea, vomiting, abdominal pain, diarrhea, constipation, blood in stool, abdominal distention and anal bleeding.  Genitourinary: Negative for dysuria, hematuria, flank pain and difficulty urinating.  Musculoskeletal: Negative for myalgias, back pain, joint swelling, arthralgias and gait problem.  Skin: Negative for color change, pallor, rash and wound.  Neurological: Negative for dizziness, tremors, weakness and light-headedness.  Hematological: Negative for adenopathy. Does not bruise/bleed easily.  Psychiatric/Behavioral: + emotional stress from having the infection      Objective:   Physical Exam BP 140/84 mmHg  Pulse 112  Temp(Src) 97.4 F (36.3 C) (Oral)  Wt 245 lb (111.131 kg)  LMP 02/24/2013 Physical Exam  Constitutional:  oriented to person, place, and time. appears well-developed and well-nourished. No distress.  HENT: [place]/AT, PERRLA, no scleral icterus Mouth/Throat: Oropharynx is clear and moist. No oropharyngeal exudate.  Cardiovascular: Normal rate, regular rhythm and normal heart sounds. Exam reveals no gallop and no friction rub.  No murmur heard.  Pulmonary/Chest: Effort normal and breath sounds normal. No respiratory distress.  has no wheezes.  Neurological: alert and oriented to person, place, and time.  Skin: lumbar wound measures 8cm length x 1.4cm depth x 1.6cm wide. Good pink granulation tissue in wound bed Ext: right arm picc line is c/d/i Psychiatric: emotionally labile, occasionally tearful. Overly anxious during this visit      Assessment & Plan:  mssa lumbar wound infection = continue vancomycin until march 13th to finish 6 wk course of therapy. We will get labs for advance to decide to finish out course of with oral antibiotics. Anticipate  to change her to cephalexin for the remaining part of course ranging form 4-6 wk until markers normalize.  Open wound = will refer to wound care clinic for further management, and if she still needs wound vac.

## 2015-08-08 ENCOUNTER — Telehealth: Payer: Self-pay | Admitting: *Deleted

## 2015-08-08 NOTE — Telephone Encounter (Signed)
[facility] pharmacist calling and asking "When are IV antibiotics to STOP?"  Dr. Hal Neer has told the pt that it is March 10th and Dr. Storm Frisk note says March 13th.  Dr. Baxter Flattery please advise.

## 2015-08-08 NOTE — Telephone Encounter (Signed)
March 13th

## 2015-08-09 NOTE — Telephone Encounter (Addendum)
[facility] informed about STOP date.  Will also pull the PICC.

## 2015-08-13 ENCOUNTER — Telehealth: Payer: Self-pay | Admitting: *Deleted

## 2015-08-13 LAB — AFB CULTURE WITH SMEAR (NOT AT ARMC)
ACID FAST SMEAR: NONE SEEN
ACID FAST SMEAR: NONE SEEN
Acid Fast Smear: NONE SEEN

## 2015-08-13 NOTE — Telephone Encounter (Signed)
Pt asking about "oral antibiotics," today was last day of IV antibiotics.  Pharmacy is correct in [facility] for prescription.  Pt would like a call back.

## 2015-08-14 ENCOUNTER — Telehealth: Payer: Self-pay | Admitting: Infectious Diseases

## 2015-08-14 ENCOUNTER — Other Ambulatory Visit: Payer: Self-pay | Admitting: Internal Medicine

## 2015-08-14 DIAGNOSIS — A4902 Methicillin resistant Staphylococcus aureus infection, unspecified site: Secondary | ICD-10-CM

## 2015-08-14 MED ORDER — CEPHALEXIN 500 MG PO CAPS
500.0000 mg | ORAL_CAPSULE | Freq: Four times a day (QID) | ORAL | Status: DC
Start: 2015-08-14 — End: 2016-11-24

## 2015-08-14 NOTE — Telephone Encounter (Signed)
Patient left a vm on the medication assistance line stating she is "coming off her shot" and wants to know if the pills have been called into the [facility].

## 2015-08-14 NOTE — Telephone Encounter (Signed)
Keflex 500mg  QID x 4 wk. i think i have sent in on epic[facility] pls check that it is correct. i have spoken to pt

## 2015-09-04 ENCOUNTER — Telehealth: Payer: Self-pay | Admitting: *Deleted

## 2015-09-04 ENCOUNTER — Ambulatory Visit: Payer: Self-pay | Admitting: Internal Medicine

## 2015-09-04 NOTE — Telephone Encounter (Signed)
Left message requesting pt call [facility] to make a new return appointment with Dr. Baxter Flattery.

## 2015-09-05 ENCOUNTER — Telehealth: Payer: Self-pay | Admitting: *Deleted

## 2015-09-05 NOTE — Telephone Encounter (Signed)
Patient called to reschedule her missed appointment from 4/4. She thought her appointment was 4/5.  Patient states she stopped her keflex, as she feels it makes her constipated.  She states she is off all medicine (including pain medication), eats salads out every day, walks 1 mile a day. She uses magnesium citrate for a bowel movement "after a few days." She states she is nauseated due to the antibiotic and constipation.  She states this started after surgery, that she never had this problem before. RN advised patient to reach out to her neurosurgeon, as this began after back surgery.  RN advised patient to reach out to her PCP for additional advice on constipation.  Please advise if her antibiotics can be changed.  She stopped her keflex 1 week ago. Landis Gandy, RN

## 2015-09-10 ENCOUNTER — Encounter (HOSPITAL_BASED_OUTPATIENT_CLINIC_OR_DEPARTMENT_OTHER): Payer: Self-pay | Attending: Internal Medicine

## 2015-10-04 ENCOUNTER — Ambulatory Visit: Payer: Self-pay | Admitting: Internal Medicine

## 2016-01-13 ENCOUNTER — Encounter (HOSPITAL_COMMUNITY): Payer: Self-pay | Admitting: *Deleted

## 2016-01-13 ENCOUNTER — Emergency Department (HOSPITAL_COMMUNITY): Payer: Self-pay

## 2016-01-13 ENCOUNTER — Emergency Department (HOSPITAL_COMMUNITY)
Admission: EM | Admit: 2016-01-13 | Discharge: 2016-01-13 | Disposition: A | Discharge status: Home / Self Care | Payer: Self-pay | Attending: Emergency Medicine | Admitting: Emergency Medicine

## 2016-01-13 DIAGNOSIS — Z87891 Personal history of nicotine dependence: Secondary | ICD-10-CM | POA: Insufficient documentation

## 2016-01-13 DIAGNOSIS — R079 Chest pain, unspecified: Secondary | ICD-10-CM | POA: Insufficient documentation

## 2016-01-13 DIAGNOSIS — E119 Type 2 diabetes mellitus without complications: Secondary | ICD-10-CM | POA: Insufficient documentation

## 2016-01-13 DIAGNOSIS — M545 Low back pain: Secondary | ICD-10-CM | POA: Insufficient documentation

## 2016-01-13 DIAGNOSIS — R1013 Epigastric pain: Secondary | ICD-10-CM | POA: Insufficient documentation

## 2016-01-13 DIAGNOSIS — R112 Nausea with vomiting, unspecified: Secondary | ICD-10-CM | POA: Insufficient documentation

## 2016-01-13 LAB — COMPREHENSIVE METABOLIC PANEL
ALK PHOS: 61 U/L (ref 38–126)
ALT: 41 U/L (ref 14–54)
ANION GAP: 8 (ref 5–15)
AST: 119 U/L — ABNORMAL HIGH (ref 15–41)
Albumin: 4.4 g/dL (ref 3.5–5.0)
BILIRUBIN TOTAL: 1 mg/dL (ref 0.3–1.2)
BUN: 17 mg/dL (ref 6–20)
CALCIUM: 8.9 mg/dL (ref 8.9–10.3)
CO2: 26 mmol/L (ref 22–32)
Chloride: 102 mmol/L (ref 101–111)
Creatinine, Ser: 0.74 mg/dL (ref 0.44–1.00)
GFR calc non Af Amer: >60 mL/min (ref >60)
Glucose, Bld: 142 mg/dL — ABNORMAL HIGH (ref 65–99)
Potassium: 3.3 mmol/L — ABNORMAL LOW (ref 3.5–5.1)
Sodium: 136 mmol/L (ref 135–145)
TOTAL PROTEIN: 7.6 g/dL (ref 6.5–8.1)

## 2016-01-13 LAB — TROPONIN I

## 2016-01-13 LAB — URINE MICROSCOPIC-ADD ON

## 2016-01-13 LAB — URINALYSIS, ROUTINE W REFLEX MICROSCOPIC
GLUCOSE, UA: NEGATIVE mg/dL
Hgb urine dipstick: NEGATIVE
LEUKOCYTES UA: NEGATIVE
NITRITE: NEGATIVE
PROTEIN: 30 mg/dL — AB
Specific Gravity, Urine: >1.03 — ABNORMAL HIGH (ref 1.005–1.030)
pH: 6 (ref 5.0–8.0)

## 2016-01-13 LAB — CBC WITH DIFFERENTIAL/PLATELET
BASOS ABS: 0 10*3/uL (ref 0.0–0.1)
BASOS PCT: 0 %
EOS ABS: 0 10*3/uL (ref 0.0–0.7)
Eosinophils Relative: 0 %
HCT: 39.2 % (ref 36.0–46.0)
Hemoglobin: 12.4 g/dL (ref 12.0–15.0)
Lymphocytes Relative: 8 %
Lymphs Abs: 0.7 10*3/uL (ref 0.7–4.0)
MCH: 26.7 pg (ref 26.0–34.0)
MCHC: 31.6 g/dL (ref 30.0–36.0)
MCV: 84.5 fL (ref 78.0–100.0)
Monocytes Absolute: 0.6 10*3/uL (ref 0.1–1.0)
Monocytes Relative: 7 %
NEUTROS PCT: 85 %
Neutro Abs: 7.8 10*3/uL — ABNORMAL HIGH (ref 1.7–7.7)
Platelets: 222 10*3/uL (ref 150–400)
RBC: 4.64 MIL/uL (ref 3.87–5.11)
RDW: 14.9 % (ref 11.5–15.5)
WBC: 9.2 10*3/uL (ref 4.0–10.5)

## 2016-01-13 LAB — PREGNANCY, URINE: PREG TEST UR: NEGATIVE

## 2016-01-13 LAB — LIPASE, BLOOD: LIPASE: 32 U/L (ref 11–51)

## 2016-01-13 MED ORDER — SODIUM CHLORIDE 0.9 % IV BOLUS (SEPSIS)
500.0000 mL | Freq: Once | INTRAVENOUS | Status: AC
  Administered 2016-01-13: 500 mL via INTRAVENOUS

## 2016-01-13 MED ORDER — KETOROLAC TROMETHAMINE 30 MG/ML IJ SOLN
30.0000 mg | Freq: Once | INTRAMUSCULAR | Status: AC
  Administered 2016-01-13: 30 mg via INTRAVENOUS
  Filled 2016-01-13: qty 1

## 2016-01-13 MED ORDER — DICYCLOMINE HCL 20 MG PO TABS: 20.0000 mg | ORAL_TABLET | Freq: Three times a day (TID) | ORAL | 0 refills | Status: DC

## 2016-01-13 MED ORDER — SODIUM CHLORIDE 0.9 % IV BOLUS (SEPSIS)
1000.0000 mL | Freq: Once | INTRAVENOUS | Status: AC
  Administered 2016-01-13: 1000 mL via INTRAVENOUS

## 2016-01-13 MED ORDER — ONDANSETRON HCL 4 MG/2ML IJ SOLN
4.0000 mg | Freq: Once | INTRAMUSCULAR | Status: AC
  Administered 2016-01-13: 4 mg via INTRAVENOUS
  Filled 2016-01-13: qty 2

## 2016-01-13 MED ORDER — FENTANYL CITRATE (PF) 100 MCG/2ML IJ SOLN
50.0000 ug | Freq: Once | INTRAMUSCULAR | Status: AC
  Administered 2016-01-13: 50 ug via INTRAVENOUS
  Filled 2016-01-13: qty 2

## 2016-01-13 MED ORDER — DICYCLOMINE HCL 10 MG/ML IM SOLN
20.0000 mg | Freq: Once | INTRAMUSCULAR | Status: AC
  Administered 2016-01-13: 20 mg via INTRAMUSCULAR
  Filled 2016-01-13: qty 2

## 2016-01-13 MED ORDER — ONDANSETRON HCL 4 MG PO TABS: 4.0000 mg | ORAL_TABLET | Freq: Three times a day (TID) | ORAL | 0 refills | Status: DC | PRN

## 2016-01-13 NOTE — ED Notes (Signed)
Dr Tomi Bamberger in to reassess

## 2016-01-13 NOTE — ED Notes (Signed)
Pt reports that it is [birth year] birthday and that she went out to supper at the [employer] and began having abd pain afterwards

## 2016-01-13 NOTE — ED Notes (Signed)
Pt requests pos- pt is to be continued as NPO for now per Dr Tomi Bamberger.

## 2016-01-13 NOTE — Discharge Instructions (Signed)
Use the zofran for nausea and vomiting. Take the bentyl for your abdominal pain. Follow up with your doctor if you continue to have abdominal pain.

## 2016-01-13 NOTE — ED Provider Notes (Signed)
[facility] DEPT Provider Note   CSN: [id number] Arrival date & time: 01/13/16  0025  First Provider Contact:  First MD Initiated Contact with Patient 01/13/16 0053        By signing my name below, I, Hansel Feinstein, attest that this documentation has been prepared under the direction and in the presence of Rolland Porter, MD. Electronically Signed: Hansel Feinstein, ED Scribe. 01/13/16. 1:02 AM.     History   Chief Complaint Chief Complaint  Patient presents with  . Abdominal Pain    HPI Cheryl Randall is a 40 y.o. female with h/o cholecystectomy 15 years ago d/t asymptomatic gallstones that was picked up on a MRI of her back, who presents to the Emergency Department complaining of moderate, sudden onset epigastric abdominal pain with radiation to the lower back and chest onset last night at 8:30pm. Pt states she was sitting in the car when she suddenly felt a sharp pain in her upper abdomen that progressively worsening. She reports that she then experienced retching, but no emesis at that time. She reports that she then continued to go to dinner at the [facility] and reports that eating significantly worsened her pain. She states that after eating, she experienced 6 episodes of emesis. Pt states that she hs not noticed any specific foods that have caused her abdominal discomfort in the last few weeks. No h/o of similar symptoms. She denies hematemesis, bloody stools, diarrhea, abdominal distension, SOB. Pt is a non-smoker and non-drinker. No daily medications. Pt is s/p back surgery on 05/31/2015 and 07/01/2015 and is not currently working.   PCP- [name]    The history is provided by the patient. No language interpreter was used.    Past Medical History:  Diagnosis Date  . Anxiety   . DDD (degenerative disc disease)   . Diabetes mellitus without complication ([place])    borderline; diet controlled  . Family history of adverse reaction to anesthesia    Patients son has bad N/V  .  Migraine   . PONV (postoperative nausea and vomiting)     Patient Active Problem List   Diagnosis Date Noted  . Staphylococcus aureus bacteremia with sepsis ([place])   . Deep incisional surgical site infection 07/01/2015  . Wound infection ([place]) 06/30/2015  . 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    Past Surgical History:  Procedure Laterality Date  . ABDOMINAL HYSTERECTOMY    . BACK SURGERY     X 2  . BREAST SURGERY Bilateral    reduction  . CHOLECYSTECTOMY    . LUMBAR LAMINECTOMY/DECOMPRESSION MICRODISCECTOMY N/A 06/30/2015   Procedure: wound exploration irrigation and debridement ,explantation of bone growth stimulator, placement of wound vac;  Surgeon: Kevan Ny Ditty, MD;  Location: [facility];  Service: Neurosurgery;  Laterality: N/A;    OB History    No data available       Home Medications    Prior to Admission medications   Medication Sig Start Date End Date Taking? Authorizing Provider  acetaminophen (TYLENOL) 325 MG tablet Take 650 mg by mouth every 6 (six) hours as needed for mild pain.    Historical Provider, MD  cephALEXin (KEFLEX) 500 MG capsule Take 1 capsule (500 mg total) by mouth 4 (four) times daily. 08/14/15   Carlyle Basques, MD  cyclobenzaprine (FLEXERIL) 10 MG tablet Take 1 tablet (10 mg total) by mouth 3 (three) times daily as needed for muscle spasms. 06/02/15   Earnie Larsson, MD  dicyclomine (BENTYL) 20 MG  tablet Take 1 tablet (20 mg total) by mouth 4 (four) times daily -  before meals and at bedtime. 01/13/16   Rolland Porter, MD  HYDROmorphone (DILAUDID) 4 MG tablet Take 1 tablet (4 mg total) by mouth every 4 (four) hours as needed for severe pain. 07/06/15   Karie Chimera, MD  ondansetron (ZOFRAN) 4 MG tablet Take 1 tablet (4 mg total) by mouth every 8 (eight) hours as needed. 01/13/16   Rolland Porter, MD  SUMAtriptan (IMITREX) 50 MG tablet Take 50 mg by mouth 2 (two) times daily as needed. Repeat in 4 hours if no relief 05/13/15   Historical Provider, MD      Family History Family History  Problem Relation Age of Onset  . Cancer Father     bladder    Social History Social History  Substance Use Topics  . Smoking status: Former Research scientist (life sciences)  . Smokeless tobacco: Never Used  . Alcohol use No  unemployed since her back surgery   Allergies   Codeine; Latex; Norco [hydrocodone-acetaminophen]; and Tape   Review of Systems Review of Systems  Cardiovascular: Positive for chest pain.  Gastrointestinal: Positive for abdominal pain, nausea and vomiting. Negative for abdominal distention and blood in stool.  Musculoskeletal: Positive for back pain.  All other systems reviewed and are negative.    Physical Exam Updated Vital Signs BP 131/84   Pulse 84   Temp 97.5 F (36.4 C) (Oral)   Resp 25   Ht 5\' 7"  (1.702 m)   Wt 235 lb (106.6 kg)   LMP 02/24/2013   SpO2 99%   BMI 36.81 kg/m   Vital signs normal    Physical Exam  Constitutional: She is oriented to person, place, and time. She appears well-developed and well-nourished.  Non-toxic appearance. She does not appear ill. She appears distressed.  HENT:  Head: Normocephalic and atraumatic.  Right Ear: External ear normal.  Left Ear: External ear normal.  Nose: Nose normal. No mucosal edema or rhinorrhea.  Mouth/Throat: Oropharynx is clear and moist and mucous membranes are normal. No dental abscesses or uvula swelling.  Eyes: Conjunctivae and EOM are normal. Pupils are equal, round, and reactive to light.  Neck: Normal range of motion and full passive range of motion without pain. Neck supple.  Cardiovascular: Normal rate, regular rhythm and normal heart sounds.  Exam reveals no gallop and no friction rub.   No murmur heard. Pulmonary/Chest: Effort normal and breath sounds normal. No respiratory distress. She has no wheezes. She has no rhonchi. She has no rales. She exhibits no tenderness and no crepitus.  Abdominal: Soft. Normal appearance. She exhibits no distension. There is  tenderness. There is no rebound and no guarding.    Diminished bowel sounds. Tender diffusely to the upper abdomen, but worse in in the epigastric region and RUQ.   Musculoskeletal: Normal range of motion. She exhibits no edema or tenderness.  Moves all extremities well.   Neurological: She is alert and oriented to person, place, and time. She has normal strength. No cranial nerve deficit.  Skin: Skin is warm, dry and intact. No rash noted. No erythema. No pallor.  Psychiatric: She has a normal mood and affect. Her speech is normal and behavior is normal. Her mood appears not anxious.  Nursing note and vitals reviewed.    ED Treatments / Results  Labs (all labs ordered are listed, but only abnormal results are displayed) Results for orders placed or performed during the hospital encounter of 01/13/16  Comprehensive metabolic panel  Result Value Ref Range   Sodium 136 135 - 145 mmol/L   Potassium 3.3 (L) 3.5 - 5.1 mmol/L   Chloride 102 101 - 111 mmol/L   CO2 26 22 - 32 mmol/L   Glucose, Bld 142 (H) 65 - 99 mg/dL   BUN 17 6 - 20 mg/dL   Creatinine, Ser 0.74 0.44 - 1.00 mg/dL   Calcium 8.9 8.9 - 10.3 mg/dL   Total Protein 7.6 6.5 - 8.1 g/dL   Albumin 4.4 3.5 - 5.0 g/dL   AST 119 (H) 15 - 41 U/L   ALT 41 14 - 54 U/L   Alkaline Phosphatase 61 38 - 126 U/L   Total Bilirubin 1.0 0.3 - 1.2 mg/dL   GFR calc non Af Amer >60 >60 mL/min   GFR calc Af Amer >60 >60 mL/min   Anion gap 8 5 - 15  Lipase, blood  Result Value Ref Range   Lipase 32 11 - 51 U/L  CBC with Differential  Result Value Ref Range   WBC 9.2 4.0 - 10.5 K/uL   RBC 4.64 3.87 - 5.11 MIL/uL   Hemoglobin 12.4 12.0 - 15.0 g/dL   HCT 39.2 36.0 - 46.0 %   MCV 84.5 78.0 - 100.0 fL   MCH 26.7 26.0 - 34.0 pg   MCHC 31.6 30.0 - 36.0 g/dL   RDW 14.9 11.5 - 15.5 %   Platelets 222 150 - 400 K/uL   Neutrophils Relative % 85 %   Neutro Abs 7.8 (H) 1.7 - 7.7 K/uL   Lymphocytes Relative 8 %   Lymphs Abs 0.7 0.7 - 4.0 K/uL    Monocytes Relative 7 %   Monocytes Absolute 0.6 0.1 - 1.0 K/uL   Eosinophils Relative 0 %   Eosinophils Absolute 0.0 0.0 - 0.7 K/uL   Basophils Relative 0 %   Basophils Absolute 0.0 0.0 - 0.1 K/uL  Troponin I  Result Value Ref Range   Troponin I <0.03 <0.03 ng/mL  Urinalysis, Routine w reflex microscopic  Result Value Ref Range   Color, Urine YELLOW YELLOW   APPearance CLEAR CLEAR   Specific Gravity, Urine >1.030 (H) 1.005 - 1.030   pH 6.0 5.0 - 8.0   Glucose, UA NEGATIVE NEGATIVE mg/dL   Hgb urine dipstick NEGATIVE NEGATIVE   Bilirubin Urine MODERATE (A) NEGATIVE   Ketones, ur >80 (A) NEGATIVE mg/dL   Protein, ur 30 (A) NEGATIVE mg/dL   Nitrite NEGATIVE NEGATIVE   Leukocytes, UA NEGATIVE NEGATIVE  Pregnancy, urine  Result Value Ref Range   Preg Test, Ur NEGATIVE NEGATIVE  Urine microscopic-add on  Result Value Ref Range   Squamous Epithelial / LPF 0-5 (A) NONE SEEN   WBC, UA 0-5 0 - 5 WBC/hpf   RBC / HPF 0-5 0 - 5 RBC/hpf   Bacteria, UA FEW (A) NONE SEEN   Crystals CA OXALATE CRYSTALS (A) NEGATIVE   Laboratory interpretation all normal except elevated SGOT, concentrated UA    EKG  EKG Interpretation  Date/Time:  Sunday January 13 2016 01:24:08 EDT Ventricular Rate:  91 PR Interval:    QRS Duration: 97 QT Interval:  375 QTC Calculation: 462 R Axis:   82 Text Interpretation:  Sinus rhythm Low voltage, extremity and precordial leads Probable anteroseptal infarct, old No old tracing to compare Confirmed by Izel Hochberg  MD-I, Majed Pellegrin ([id number]) on 01/13/2016 1:47:24 AM       Radiology Ct Renal Stone Study  Result Date: 01/13/2016 CLINICAL DATA:  Acute onset of left-sided abdominal and back pain, with nausea and vomiting. Initial encounter. EXAM: CT ABDOMEN AND PELVIS WITHOUT CONTRAST TECHNIQUE: Multidetector CT imaging of the abdomen and pelvis was performed following the standard protocol without IV contrast. COMPARISON:  CT of the abdomen and pelvis from 02/24/2010 FINDINGS: The  visualized lung bases are clear. The liver and spleen are unremarkable in appearance. The patient is status post cholecystectomy, with clips noted at the gallbladder fossa. The pancreas and adrenal glands are unremarkable. The kidneys are unremarkable in appearance. There is no evidence of hydronephrosis. No renal or ureteral stones are seen. No perinephric stranding is appreciated. No free fluid is identified. The small bowel is unremarkable in appearance. The stomach is within normal limits. No acute vascular abnormalities are seen. The appendix is normal in caliber and contains air, without evidence of appendicitis. The colon is largely decompressed and grossly unremarkable in appearance. The bladder is mildly distended and grossly unremarkable. The patient is status post hysterectomy. No suspicious adnexal masses are seen. The ovaries are relatively symmetric and unremarkable. No inguinal lymphadenopathy is seen. No acute osseous abnormalities are identified. Lumbosacral spinal fusion hardware is noted at L5-S1. IMPRESSION: Unremarkable noncontrast CT of the abdomen and pelvis. Electronically Signed   By: Garald Balding M.D.   On: 01/13/2016 04:14    Procedures Procedures (including critical care time)  Medications Ordered in ED Medications  sodium chloride 0.9 % bolus 1,000 mL (1,000 mLs Intravenous New Bag/Given 01/13/16 0142)  sodium chloride 0.9 % bolus 500 mL (500 mLs Intravenous New Bag/Given 01/13/16 0142)  fentaNYL (SUBLIMAZE) injection 50 mcg (50 mcg Intravenous Given 01/13/16 0149)  ondansetron (ZOFRAN) injection 4 mg (4 mg Intravenous Given 01/13/16 0150)  ketorolac (TORADOL) 30 MG/ML injection 30 mg (30 mg Intravenous Given 01/13/16 0345)  dicyclomine (BENTYL) injection 20 mg (20 mg Intramuscular Given 01/13/16 0539)     Initial Impression / Assessment and Plan / ED Course  I have reviewed the triage vital signs and the nursing notes.  Pertinent labs & imaging results that were  available during my care of the patient were reviewed by me and considered in my medical decision making (see chart for details).  Clinical Course    DIAGNOSTIC STUDIES: Oxygen Saturation is 98% on RA, normal by my interpretation.    COORDINATION OF CARE: 12:59 AM Discussed treatment plan with pt at bedside which includes lab work and pt agreed to plan. Patient was given IV fluids, IV pain and nausea medication.   Recheck at 3:15 AM patient states her pain is improved however now her pain is more in the left flank area. She lets me palpate her upper abdomen without difficulty now. We discussed her test results which includes crystals in her urine which would suggest a kidney stone. She is agreeable to getting a CT scan.  04:45 AM pt given her CT results, she is very unhappy, states her appendix has been removed. States she is getting epigastric abdominal pain again. Given bentyl IM.   5 AM I reviewed Dr. Johnnye Sima operative note from 2012 and although he describes seeing the appendix there is no description of him removing the appendix. This was relayed to the patient.  Recheck at 06:40 AM patient states her pain is improving after the Bentyl. I offered to let the patient stay longer and get a right upper quadrant ultrasound today or even schedule it as an outpatient later. She is declined. Patient is upset because I have not given her a  definitive diagnosis for her pain. Tried to explain to her our role in the emergency department is to her rule out the bad things such as appendicitis or cholecystitis or inflamed intestines such as colitis and sometimes we do not have a definite diagnosis. However she is unhappy..  Final Clinical Impressions(s) / ED Diagnoses   Final diagnoses:  Abdominal pain, acute, epigastric    New Prescriptions New Prescriptions   DICYCLOMINE (BENTYL) 20 MG TABLET    Take 1 tablet (20 mg total) by mouth 4 (four) times daily -  before meals and at bedtime.    ONDANSETRON (ZOFRAN) 4 MG TABLET    Take 1 tablet (4 mg total) by mouth every 8 (eight) hours as needed.    Plan discharge  Rolland Porter, MD, FACEP   I personally performed the services described in this documentation, which was scribed in my presence. The recorded information has been reviewed and considered.  Rolland Porter, MD, Barbette Or, MD 01/13/16 [phone]

## 2016-01-13 NOTE — ED Notes (Signed)
Pt reports that her pain is unchanged-

## 2016-01-13 NOTE — ED Triage Notes (Signed)
Pt c/o abd and back pain that started yesterday with n/v, chills, denies any diarrhea,

## 2016-03-16 IMAGING — CT CT L SPINE W/ CM
3 of 13 series · 7 of 35 positions shown, 8 images · IV contrast (Iodine)
Comparison: Preoperative lumbar CT myelogram 04/13/2015.

CLINICAL DATA: 39-year-old diabetic with prior L4-5 fusion and
placement of an internal bone growth stimulator on 05/31/2015.
Patient presents with 1 day history of fever, chills, back pain and
purulent drainage from the wound in the lower back.

EXAM:
CT LUMBAR SPINE WITH CONTRAST
TECHNIQUE: Multidetector CT imaging of the lumbar spine was performed with
intravenous contrast administration. Multiplanar CT image
reconstructions were also generated.
CONTRAST:  100mL OMNIPAQUE IOHEXOL 300 MG/ML IV.

[Series 2010: soft tissue cor · coronal · 0.51mm/px · 1 of 106 slices shown]
[im 53/106  bone]
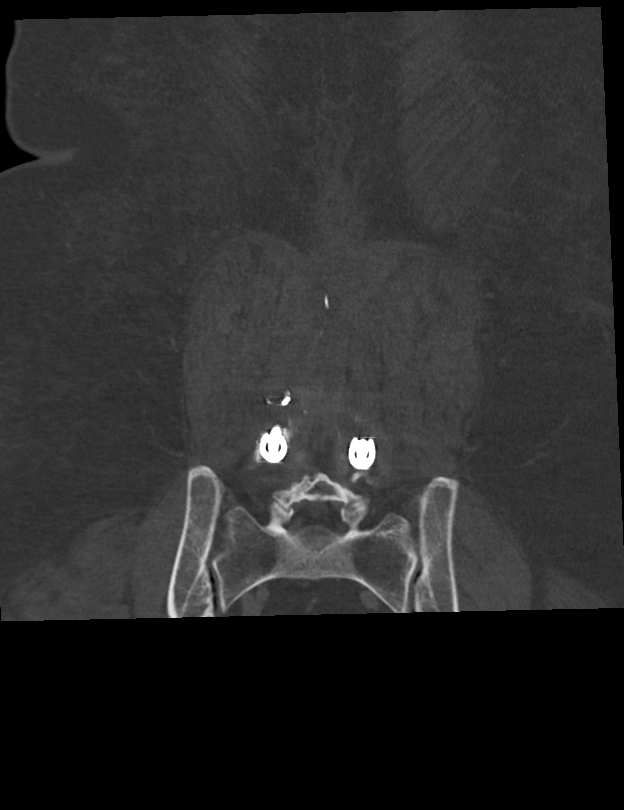

[Series 2011: sag st · sagittal · 0.50mm/px · 5 of 156 slices shown]
[im 45/156  bone]
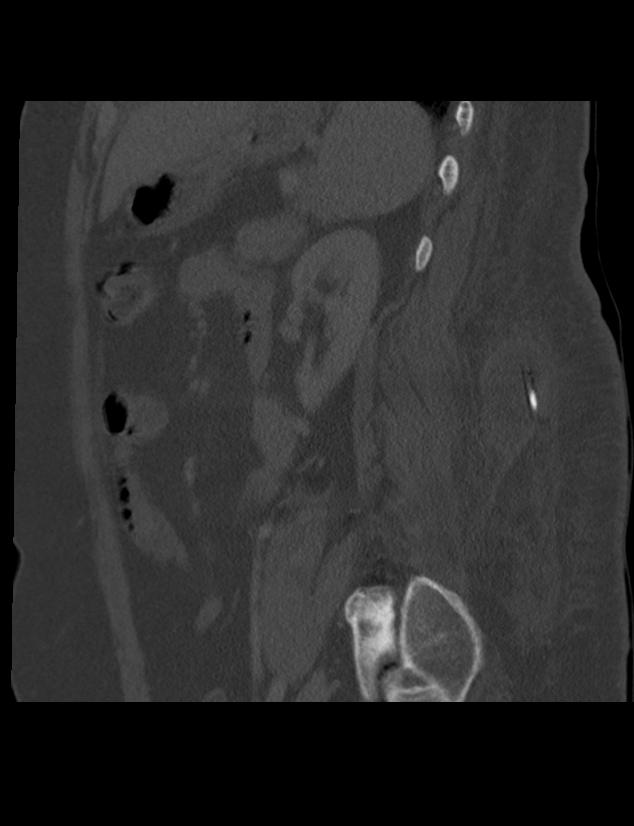
[im 56/156  bone]
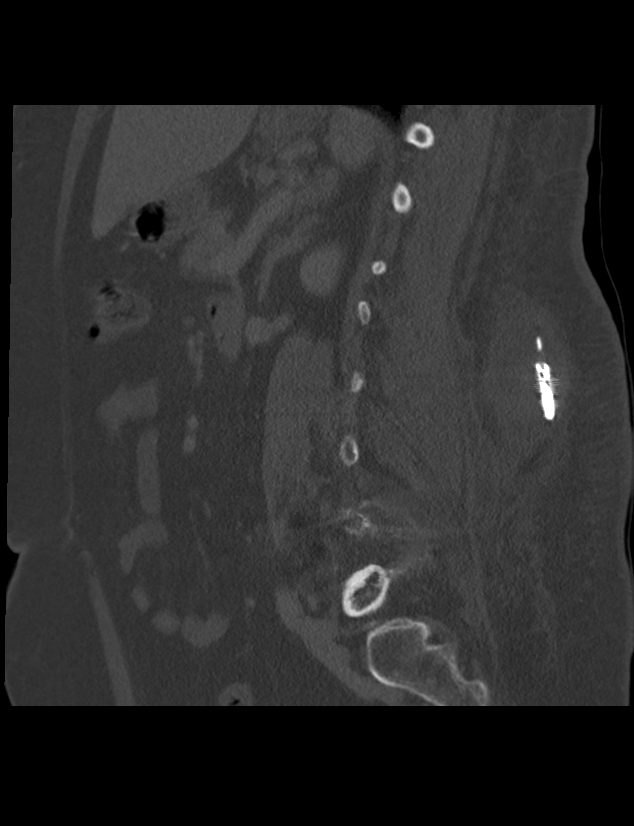
[im 67/156  bone]
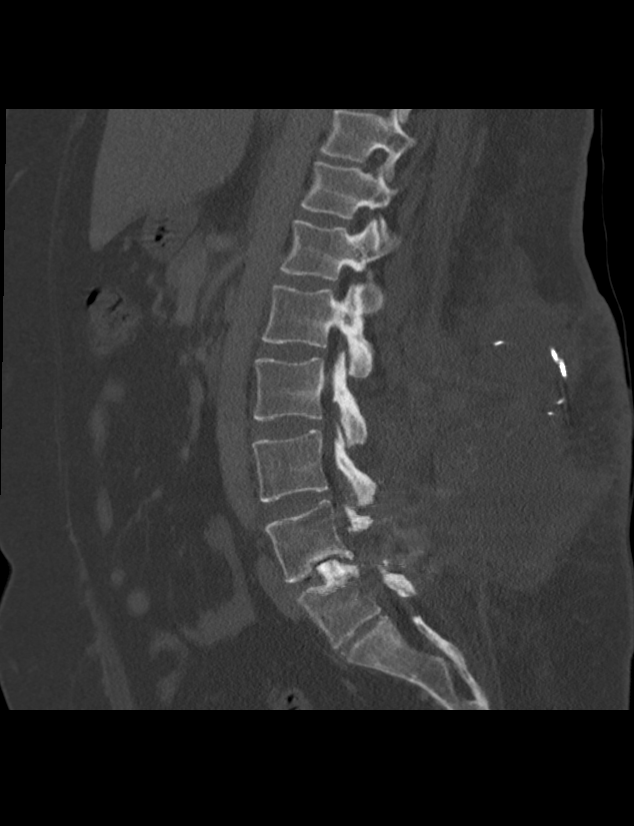
[im 78/156  bone]
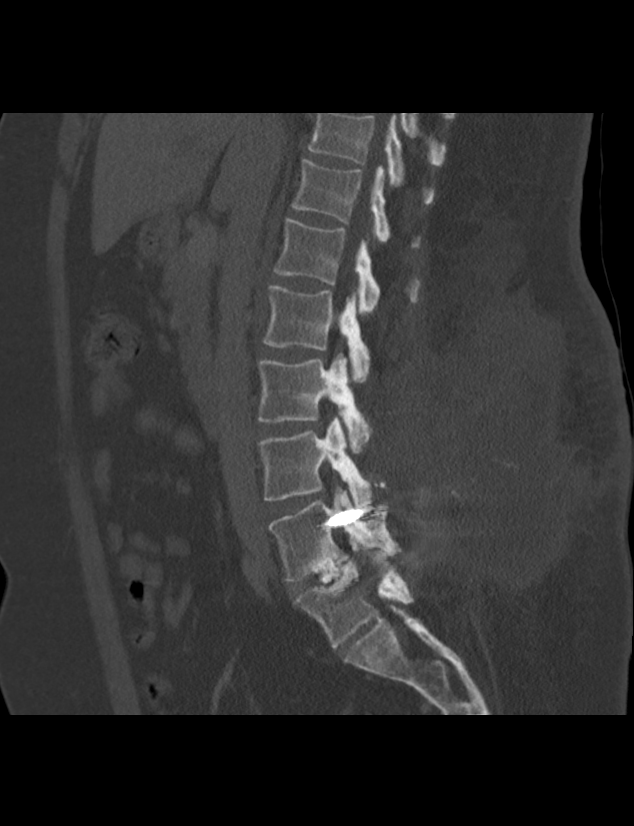
[im 89/156  bone]
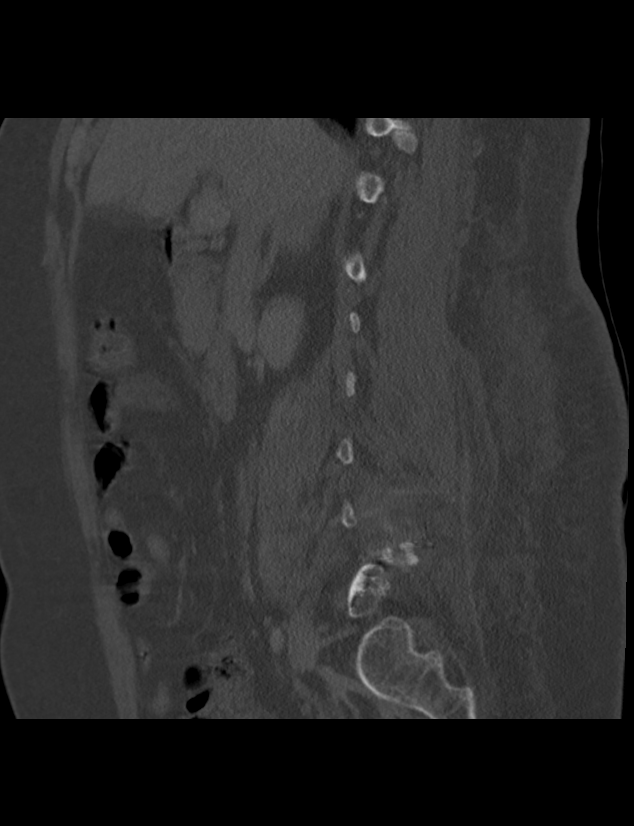

[Series 2013: l4-l5 · axial · 0.62mm/px · z∈[-634,-634]mm · 1 of 21 slices shown, 2 images]
[im 1/21  soft-tissue]
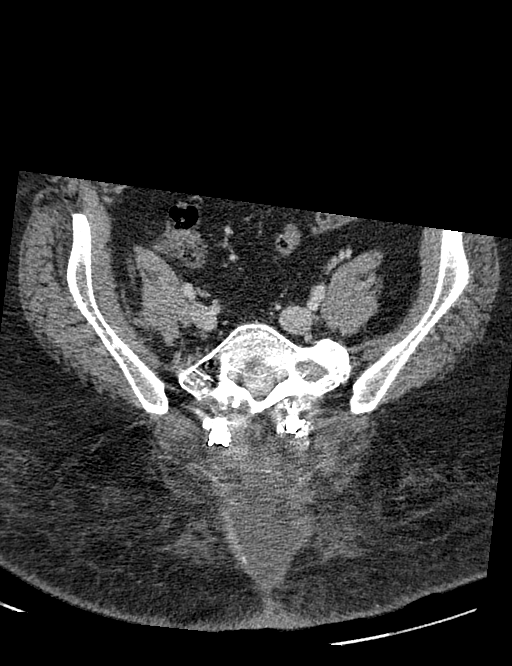
[im 1/21  bone]
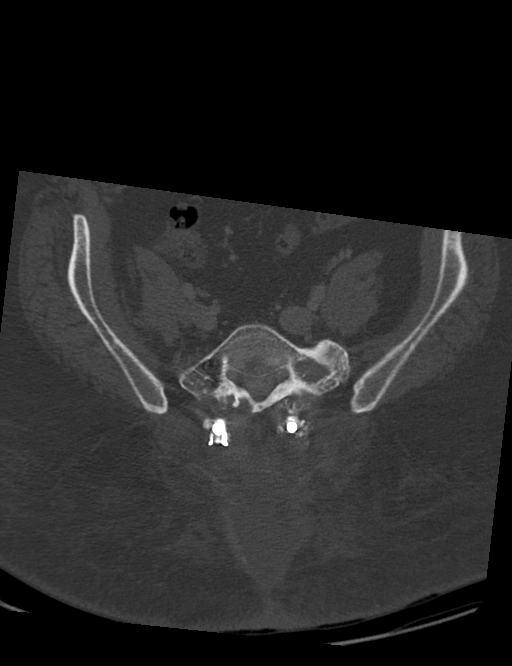

[7 of 35 positions shown; findings below may reference images not displayed]

FINDINGS: Large fluid collection involving the subcutaneous tissues of the
lower back with an enhancing rim, maximum measurements approximating
13.9 x 9.3 x 14.8 cm, associated with overlying skin thickening. The
implantable bone stimulating devices within the fluid collection, as
are the wire leads. There may be involvement of the interspinous
ligaments at L1-2 and L2-3. I do not convincingly identify an
epidural abscess. There is fluid or phlegmonous material surrounding
the rod between the left L4 and L5 pedicle screws, also with an
enhancing rim, extending upward to surround the bone graft at the L4
level and inferiorly to surround the bone graft at the L5 level.

Bone window images demonstrate no evidence of osteomyelitis
currently. The fusion hardware appears intact. The fusion plug
between L4 and L5 is position in the posterior disc space as
previously. Note is again made of an assimilation joint between the
left L5 transverse process and the 1st sacral segment with ankylosis
of this joint and bony sclerosis. Degenerative changes are present
in the sacroiliac joints with vacuum phenomenon.
IMPRESSION: 1. Large subcutaneous abscess involving the low back, measured
above, within which the bone stimulating device and its associated
wires are located.
2. There may be involvement of the interspinous ligaments at L1-2
and L2-3. However, there is no convincing evidence of epidural
abscess.
3. Possible abscess or phlegmonous material surrounding the fusion
rod between the left L4 and L5 pedicle screws, with the abscess
extending superiorly to involves the bone graft material at the L4
level and inferiorly to involve the bone graft material at the L5
level. (There is no similar finding on the right, so I do not
believe this represents postsurgical granulation material).

## 2016-11-24 ENCOUNTER — Ambulatory Visit (INDEPENDENT_AMBULATORY_CARE_PROVIDER_SITE_OTHER): Payer: Self-pay | Admitting: Physician Assistant

## 2016-11-24 ENCOUNTER — Encounter: Payer: Self-pay | Admitting: Physician Assistant

## 2016-11-24 VITALS — BP 123/83 | HR 103 | Temp 97.1°F | Ht 67.0 in | Wt 259.0 lb

## 2016-11-24 DIAGNOSIS — A4101 Sepsis due to Methicillin susceptible Staphylococcus aureus: Secondary | ICD-10-CM

## 2016-11-24 DIAGNOSIS — T814XXS Infection following a procedure, sequela: Secondary | ICD-10-CM

## 2016-11-24 DIAGNOSIS — M5137 Other intervertebral disc degeneration, lumbosacral region: Secondary | ICD-10-CM

## 2016-11-24 DIAGNOSIS — L089 Local infection of the skin and subcutaneous tissue, unspecified: Secondary | ICD-10-CM

## 2016-11-24 DIAGNOSIS — G894 Chronic pain syndrome: Secondary | ICD-10-CM

## 2016-11-24 DIAGNOSIS — M51379 Other intervertebral disc degeneration, lumbosacral region without mention of lumbar back pain or lower extremity pain: Secondary | ICD-10-CM

## 2016-11-24 DIAGNOSIS — S32009K Unspecified fracture of unspecified lumbar vertebra, subsequent encounter for fracture with nonunion: Secondary | ICD-10-CM

## 2016-11-24 DIAGNOSIS — IMO0001 Reserved for inherently not codable concepts without codable children: Secondary | ICD-10-CM

## 2016-11-24 DIAGNOSIS — T148XXA Other injury of unspecified body region, initial encounter: Secondary | ICD-10-CM

## 2016-11-24 HISTORY — DX: Other intervertebral disc degeneration, lumbosacral region without mention of lumbar back pain or lower extremity pain: M51.379

## 2016-11-24 HISTORY — DX: Other intervertebral disc degeneration, lumbosacral region: M51.37

## 2016-11-24 MED ORDER — FUROSEMIDE 20 MG PO TABS: 20.0000 mg | ORAL_TABLET | Freq: Every day | ORAL | 3 refills | Status: DC

## 2016-11-24 MED ORDER — IBUPROFEN 800 MG PO TABS: 800.0000 mg | ORAL_TABLET | Freq: Three times a day (TID) | ORAL | 0 refills | Status: DC | PRN

## 2016-11-24 MED ORDER — GABAPENTIN 100 MG PO CAPS: 100.0000 mg | ORAL_CAPSULE | Freq: Every day | ORAL | 3 refills | Status: DC

## 2016-11-24 MED ORDER — TRAZODONE HCL 50 MG PO TABS: 50.0000 mg | ORAL_TABLET | Freq: Every evening | ORAL | 3 refills | Status: DC | PRN

## 2016-11-24 NOTE — Patient Instructions (Signed)
In a few days you may receive a survey in the mail or online from [employer] regarding your visit with us today. Please take a moment to fill this out. Your feedback is very important to our whole office. It can help us better understand your needs as well as improve your experience and satisfaction. Thank you for taking your time to complete it. We care about you.  Kendalynn Wideman, PA-C  

## 2016-11-25 DIAGNOSIS — G894 Chronic pain syndrome: Secondary | ICD-10-CM

## 2016-11-25 HISTORY — DX: Chronic pain syndrome: G89.4

## 2016-11-25 NOTE — Progress Notes (Signed)
BP 123/83   Pulse (!) 103   Temp 97.1 F (36.2 C) (Oral)   Ht 5\' 7"  (1.702 m)   Wt 259 lb (117.5 kg)   LMP 02/24/2013   BMI 40.57 kg/m    Subjective:    Patient ID: Cheryl Randall, female    DOB: Sep 24, 1975, 41 y.o.   MRN: [id number]  HPI: Cheryl Randall is a 41 y.o. 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  Years ago she worked in a [employer] and can certainly not work such physical labor.  In February 2017 she had surgery for disc repair but ended up with wound infection and sepsis.  Wound vac had to be placed.  In December 2017 had her 3rd back surgery, and in January the wound reopened and she had sepsis and had to had it heal by secondary intention.  She is tremendously weakness, out of same and in great pain. She adamantly tries to avoid any narcotic, which I have commended on doing.  In my opinion she is fully disabled and cannot return to the active working person she once was.  We are working on the emotions and anxiety over this.  Relevant past medical, surgical, family and social history reviewed and updated as indicated. Allergies and medications reviewed and updated.  Past Medical History:  Diagnosis Date  . Anxiety   . DDD (degenerative disc disease)   . Diabetes mellitus without complication ([place])    borderline; diet controlled  . Family history of adverse reaction to anesthesia    Patients son has bad N/V  . Migraine   . PONV (postoperative nausea and vomiting)     Past Surgical History:  Procedure Laterality Date  . ABDOMINAL HYSTERECTOMY    . BACK SURGERY     X 2  . BREAST SURGERY Bilateral    reduction  . CHOLECYSTECTOMY    . LUMBAR LAMINECTOMY/DECOMPRESSION MICRODISCECTOMY N/A 06/30/2015   Procedure: wound exploration irrigation and debridement ,explantation of bone growth stimulator,  placement of wound vac;  Surgeon: Kevan Ny Ditty, MD;  Location: [facility];  Service: Neurosurgery;  Laterality: N/A;    Review of Systems  Constitutional: Negative for activity change, fatigue and fever.  HENT: Negative.   Eyes: Negative.   Respiratory: Negative.  Negative for cough.   Cardiovascular: Negative.  Negative for chest pain.  Gastrointestinal: Negative.  Negative for abdominal pain.  Endocrine: Negative.   Genitourinary: Negative.  Negative for dysuria.  Musculoskeletal: Positive for arthralgias, back pain, gait problem and myalgias.  Skin: Negative.   Neurological: Positive for weakness.    Allergies as of 11/24/2016      Reactions   Codeine Itching, Rash   Latex Itching, Rash   Norco [hydrocodone-acetaminophen] Itching, Rash   Tape Itching, Rash      Medication List       Accurate as of 11/24/16 11:59 PM. Always use your most recent med list.          acetaminophen 325 MG tablet Commonly known as:  TYLENOL Take 650 mg by mouth every 6 (six) hours as needed for mild pain.   dicyclomine 20 MG tablet Commonly known as:  BENTYL Take 1 tablet (20 mg total) by mouth 4 (four) times daily -  before meals and at bedtime.   furosemide 20 MG tablet  Commonly known as:  LASIX Take 1 tablet (20 mg total) by mouth daily.   gabapentin 100 MG capsule Commonly known as:  NEURONTIN Take 1-3 capsules (100-300 mg total) by mouth at bedtime.   HYDROmorphone 4 MG tablet Commonly known as:  DILAUDID Take 1 tablet (4 mg total) by mouth every 4 (four) hours as needed for severe pain.   ibuprofen 800 MG tablet Commonly known as:  ADVIL,MOTRIN Take 1 tablet (800 mg total) by mouth every 8 (eight) hours as needed.   ondansetron 4 MG tablet Commonly known as:  ZOFRAN Take 1 tablet (4 mg total) by mouth every 8 (eight) hours as needed.   traZODone 50 MG tablet Commonly known as:  DESYREL Take 1-2 tablets (50-100 mg total) by mouth at bedtime as needed for  sleep.          Objective:    BP 123/83   Pulse (!) 103   Temp 97.1 F (36.2 C) (Oral)   Ht 5\' 7"  (1.702 m)   Wt 259 lb (117.5 kg)   LMP 02/24/2013   BMI 40.57 kg/m   Allergies  Allergen Reactions  . Codeine Itching and Rash  . Latex Itching and Rash  . Norco [Hydrocodone-Acetaminophen] Itching and Rash  . Tape Itching and Rash    Physical Exam  Constitutional: She is oriented to person, place, and time. She appears well-developed and well-nourished.  HENT:  Head: Normocephalic and atraumatic.  Eyes: Conjunctivae and EOM are normal. Pupils are equal, round, and reactive to light.  Cardiovascular: Normal rate, regular rhythm, normal heart sounds and intact distal pulses.   Pulmonary/Chest: Effort normal and breath sounds normal.  Abdominal: Soft. Bowel sounds are normal.  Musculoskeletal:       Lumbar back: She exhibits decreased range of motion, tenderness, pain and spasm.       Back:  Large wide wound from failed surgery  Neurological: She is alert and oriented to person, place, and time. She has normal reflexes. She displays no atrophy. She exhibits abnormal muscle tone.  Skin: Skin is warm and dry. No rash noted.  Psychiatric: She has a normal mood and affect. Her behavior is normal. Judgment and thought content normal.        Assessment & Plan:   1. Pseudoarthrosis of lumbar spine - gabapentin (NEURONTIN) 100 MG capsule; Take 1-3 capsules (100-300 mg total) by mouth at bedtime.  Dispense: 90 capsule; Refill: 3 - ibuprofen (ADVIL,MOTRIN) 800 MG tablet; Take 1 tablet (800 mg total) by mouth every 8 (eight) hours as needed.  Dispense: 90 tablet; Refill: 0  2. DDD (degenerative disc disease), lumbosacral - gabapentin (NEURONTIN) 100 MG capsule; Take 1-3 capsules (100-300 mg total) by mouth at bedtime.  Dispense: 90 capsule; Refill: 3 - ibuprofen (ADVIL,MOTRIN) 800 MG tablet; Take 1 tablet (800 mg total) by mouth every 8 (eight) hours as needed.  Dispense: 90  tablet; Refill: 0  3. Wound infection  4. Deep incisional surgical site infection, sequela - gabapentin (NEURONTIN) 100 MG capsule; Take 1-3 capsules (100-300 mg total) by mouth at bedtime.  Dispense: 90 capsule; Refill: 3  5. Staphylococcus aureus bacteremia with sepsis ([place]) - gabapentin (NEURONTIN) 100 MG capsule; Take 1-3 capsules (100-300 mg total) by mouth at bedtime.  Dispense: 90 capsule; Refill: 3  6. Chronic pain syndrome - gabapentin (NEURONTIN) 100 MG capsule; Take 1-3 capsules (100-300 mg total) by mouth at bedtime.  Dispense: 90 capsule; Refill: 3 - ibuprofen (ADVIL,MOTRIN) 800 MG tablet; Take 1 tablet (  800 mg total) by mouth every 8 (eight) hours as needed.  Dispense: 90 tablet; Refill: 0   Continue all other maintenance medications as listed above.  Follow up plan: Return in about 3 months (around 02/24/2017) for recheck.  Educational handout given for chronic pain  Terald Sleeper PA-C [facility] [address]  [place], [place] [address] [phone]   11/25/2016, 10:16 PM

## 2017-02-24 ENCOUNTER — Ambulatory Visit: Payer: Self-pay | Admitting: Physician Assistant

## 2017-03-24 ENCOUNTER — Encounter: Payer: Self-pay | Admitting: Physician Assistant

## 2017-03-24 ENCOUNTER — Ambulatory Visit (INDEPENDENT_AMBULATORY_CARE_PROVIDER_SITE_OTHER): Payer: Self-pay | Admitting: Physician Assistant

## 2017-03-24 VITALS — BP 123/79 | HR 91 | Temp 96.9°F | Ht 67.0 in | Wt 256.0 lb

## 2017-03-24 DIAGNOSIS — M5137 Other intervertebral disc degeneration, lumbosacral region: Secondary | ICD-10-CM

## 2017-03-24 DIAGNOSIS — G894 Chronic pain syndrome: Secondary | ICD-10-CM

## 2017-03-24 DIAGNOSIS — S32009K Unspecified fracture of unspecified lumbar vertebra, subsequent encounter for fracture with nonunion: Secondary | ICD-10-CM

## 2017-03-24 DIAGNOSIS — A4101 Sepsis due to Methicillin susceptible Staphylococcus aureus: Secondary | ICD-10-CM

## 2017-03-24 DIAGNOSIS — T8142XA Infection following a procedure, deep incisional surgical site, initial encounter: Secondary | ICD-10-CM

## 2017-03-24 DIAGNOSIS — M51379 Other intervertebral disc degeneration, lumbosacral region without mention of lumbar back pain or lower extremity pain: Secondary | ICD-10-CM

## 2017-03-24 DIAGNOSIS — R601 Generalized edema: Secondary | ICD-10-CM

## 2017-03-24 DIAGNOSIS — K580 Irritable bowel syndrome with diarrhea: Secondary | ICD-10-CM

## 2017-03-24 HISTORY — DX: Irritable bowel syndrome with diarrhea: K58.0

## 2017-03-24 HISTORY — DX: Generalized edema: R60.1

## 2017-03-24 MED ORDER — IBUPROFEN 800 MG PO TABS
800.0000 mg | ORAL_TABLET | Freq: Three times a day (TID) | ORAL | 11 refills | Status: DC | PRN
Start: 2017-03-24 — End: 2018-11-05

## 2017-03-24 NOTE — Progress Notes (Signed)
BP 123/79   Pulse 91   Temp (!) 96.9 F (36.1 C) (Oral)   Ht 5\' 7"  (1.702 m)   Wt 256 lb (116.1 kg)   LMP 02/24/2013   BMI 40.10 kg/m    Subjective:    Patient ID: Cheryl Randall, female    DOB: [birth year], 41 y.o.   MRN: [id number]  HPI: Cheryl Randall is a 41 y.o. female presenting on 03/24/2017 for Follow-up (3 month)  Has severe DDD with recurrent episodes and surgery. Had failed back surgery and failure of equipment and needed surgery.  Had bad infection in the wound after the repair.  After the infection she had even had to have daily IV antibiotics for many months.  This took her out of work and ability to function in normal day-to-day life.  She continues still with severe pain in the left leg.  She cannot sit still for very long.  She has to stand up to relieve herself.  Her neurosurgeon is Dr. Hal Neer.  Had disability hearing and has not heard a decision and it has been 3 and 1/2 months.  Still waiting for decision.    Continues with obvious episodes.  States that the weeks she will have abdominal cramping and the need to go to the bathroom very quickly.  She will occasionally have an accident where she does not make it.  Her symptoms always diarrhea.  The Bentyl does calm things down.  It does make her somewhat tired and sleepy and very dry mouth. She is also still continue with some edema in her lower legs and hands.  She is taking Lasix as much as possible.  When she has to go out she does not take it because she has to go to the bathroom so frequently.  Relevant past medical, surgical, family and social history reviewed and updated as indicated. Allergies and medications reviewed and updated.  Past Medical History:  Diagnosis Date  . Anxiety   . DDD (degenerative disc disease)   . Diabetes mellitus without complication ([place])    borderline; diet controlled  . Family history of adverse reaction to anesthesia    Patients son has bad N/V  . Migraine   . PONV  (postoperative nausea and vomiting)     Past Surgical History:  Procedure Laterality Date  . ABDOMINAL HYSTERECTOMY    . BACK SURGERY     X 2  . BREAST SURGERY Bilateral    reduction  . CHOLECYSTECTOMY    . LUMBAR LAMINECTOMY/DECOMPRESSION MICRODISCECTOMY N/A 06/30/2015   Procedure: wound exploration irrigation and debridement ,explantation of bone growth stimulator, placement of wound vac;  Surgeon: Kevan Ny Ditty, MD;  Location: [facility];  Service: Neurosurgery;  Laterality: N/A;    Review of Systems  Constitutional: Positive for fatigue. Negative for fever.  HENT: Negative.   Eyes: Negative.   Respiratory: Negative.   Cardiovascular: Positive for palpitations and leg swelling. Negative for chest pain.  Gastrointestinal: Negative.   Genitourinary: Negative.   Musculoskeletal: Positive for arthralgias, back pain, joint swelling and myalgias.  Skin: Negative.   Neurological: Positive for weakness and numbness.  Psychiatric/Behavioral: Positive for sleep disturbance.    Allergies as of 03/24/2017      Reactions   Codeine Itching, Rash   Latex Itching, Rash   Norco [hydrocodone-acetaminophen] Itching, Rash   Tape Itching, Rash      Medication List       Accurate as of 03/24/17 11:47 AM. Always use your  most recent med list.          acetaminophen 325 MG tablet Commonly known as:  TYLENOL Take 650 mg by mouth every 6 (six) hours as needed for mild pain.   dicyclomine 20 MG tablet Commonly known as:  BENTYL Take 1 tablet (20 mg total) by mouth 4 (four) times daily -  before meals and at bedtime.   furosemide 20 MG tablet Commonly known as:  LASIX Take 1 tablet (20 mg total) by mouth daily.   gabapentin 100 MG capsule Commonly known as:  NEURONTIN Take 1-3 capsules (100-300 mg total) by mouth at bedtime.   HYDROmorphone 4 MG tablet Commonly known as:  DILAUDID Take 1 tablet (4 mg total) by mouth every 4 (four) hours as needed for severe pain.     ibuprofen 800 MG tablet Commonly known as:  ADVIL,MOTRIN Take 1 tablet (800 mg total) by mouth every 8 (eight) hours as needed.   ondansetron 4 MG tablet Commonly known as:  ZOFRAN Take 1 tablet (4 mg total) by mouth every 8 (eight) hours as needed.   traZODone 50 MG tablet Commonly known as:  DESYREL Take 1-2 tablets (50-100 mg total) by mouth at bedtime as needed for sleep.          Objective:    BP 123/79   Pulse 91   Temp (!) 96.9 F (36.1 C) (Oral)   Ht 5\' 7"  (1.702 m)   Wt 256 lb (116.1 kg)   LMP 02/24/2013   BMI 40.10 kg/m   Allergies  Allergen Reactions  . Codeine Itching and Rash  . Latex Itching and Rash  . Norco [Hydrocodone-Acetaminophen] Itching and Rash  . Tape Itching and Rash    Physical Exam  Musculoskeletal:       Lumbar back: She exhibits decreased range of motion, tenderness, pain and spasm.       Back:  Neurological: She has normal strength. No sensory deficit.  Reflex Scores:      Patellar reflexes are 1+ on the right side and 0 on the left side. Decreased strength in left leg on extension and flexion    Results for orders placed or performed during the hospital encounter of 01/13/16  Comprehensive metabolic panel  Result Value Ref Range   Sodium 136 135 - 145 mmol/L   Potassium 3.3 (L) 3.5 - 5.1 mmol/L   Chloride 102 101 - 111 mmol/L   CO2 26 22 - 32 mmol/L   Glucose, Bld 142 (H) 65 - 99 mg/dL   BUN 17 6 - 20 mg/dL   Creatinine, Ser 0.74 0.44 - 1.00 mg/dL   Calcium 8.9 8.9 - 10.3 mg/dL   Total Protein 7.6 6.5 - 8.1 g/dL   Albumin 4.4 3.5 - 5.0 g/dL   AST 119 (H) 15 - 41 U/L   ALT 41 14 - 54 U/L   Alkaline Phosphatase 61 38 - 126 U/L   Total Bilirubin 1.0 0.3 - 1.2 mg/dL   GFR calc non Af Amer >60 >60 mL/min   GFR calc Af Amer >60 >60 mL/min   Anion gap 8 5 - 15  Lipase, blood  Result Value Ref Range   Lipase 32 11 - 51 U/L  CBC with Differential  Result Value Ref Range   WBC 9.2 4.0 - 10.5 K/uL   RBC 4.64 3.87 - 5.11  MIL/uL   Hemoglobin 12.4 12.0 - 15.0 g/dL   HCT 39.2 36.0 - 46.0 %   MCV 84.5 78.0 -  100.0 fL   MCH 26.7 26.0 - 34.0 pg   MCHC 31.6 30.0 - 36.0 g/dL   RDW 14.9 11.5 - 15.5 %   Platelets 222 150 - 400 K/uL   Neutrophils Relative % 85 %   Neutro Abs 7.8 (H) 1.7 - 7.7 K/uL   Lymphocytes Relative 8 %   Lymphs Abs 0.7 0.7 - 4.0 K/uL   Monocytes Relative 7 %   Monocytes Absolute 0.6 0.1 - 1.0 K/uL   Eosinophils Relative 0 %   Eosinophils Absolute 0.0 0.0 - 0.7 K/uL   Basophils Relative 0 %   Basophils Absolute 0.0 0.0 - 0.1 K/uL  Troponin I  Result Value Ref Range   Troponin I <0.03 <0.03 ng/mL  Urinalysis, Routine w reflex microscopic  Result Value Ref Range   Color, Urine YELLOW YELLOW   APPearance CLEAR CLEAR   Specific Gravity, Urine >1.030 (H) 1.005 - 1.030   pH 6.0 5.0 - 8.0   Glucose, UA NEGATIVE NEGATIVE mg/dL   Hgb urine dipstick NEGATIVE NEGATIVE   Bilirubin Urine MODERATE (A) NEGATIVE   Ketones, ur >80 (A) NEGATIVE mg/dL   Protein, ur 30 (A) NEGATIVE mg/dL   Nitrite NEGATIVE NEGATIVE   Leukocytes, UA NEGATIVE NEGATIVE  Pregnancy, urine  Result Value Ref Range   Preg Test, Ur NEGATIVE NEGATIVE  Urine microscopic-add on  Result Value Ref Range   Squamous Epithelial / LPF 0-5 (A) NONE SEEN   WBC, UA 0-5 0 - 5 WBC/hpf   RBC / HPF 0-5 0 - 5 RBC/hpf   Bacteria, UA FEW (A) NONE SEEN   Crystals CA OXALATE CRYSTALS (A) NEGATIVE      Assessment & Plan:   1. DDD (degenerative disc disease), lumbosacral Worsening  Has dilaudid for severe days  2. Pseudoarthrosis of lumbar spine  3. Staphylococcus aureus bacteremia with sepsis ([place])  4. Generalized edema Continue lasix  5. Irritable bowel syndrome with diarrhea Bentyl  6. Chronic pain syndrome  7. Deep incisional surgical site infection    Current Outpatient Prescriptions:  .  acetaminophen (TYLENOL) 325 MG tablet, Take 650 mg by mouth every 6 (six) hours as needed for mild pain., Disp: , Rfl:  .   furosemide (LASIX) 20 MG tablet, Take 1 tablet (20 mg total) by mouth daily., Disp: 30 tablet, Rfl: 3 .  gabapentin (NEURONTIN) 100 MG capsule, Take 1-3 capsules (100-300 mg total) by mouth at bedtime., Disp: 90 capsule, Rfl: 3 .  HYDROmorphone (DILAUDID) 4 MG tablet, Take 1 tablet (4 mg total) by mouth every 4 (four) hours as needed for severe pain., Disp: 75 tablet, Rfl: 0 .  ibuprofen (ADVIL,MOTRIN) 800 MG tablet, Take 1 tablet (800 mg total) by mouth every 8 (eight) hours as needed., Disp: 90 tablet, Rfl: 0 .  ondansetron (ZOFRAN) 4 MG tablet, Take 1 tablet (4 mg total) by mouth every 8 (eight) hours as needed., Disp: 6 tablet, Rfl: 0 .  traZODone (DESYREL) 50 MG tablet, Take 1-2 tablets (50-100 mg total) by mouth at bedtime as needed for sleep., Disp: 60 tablet, Rfl: 3 .  dicyclomine (BENTYL) 20 MG tablet, Take 1 tablet (20 mg total) by mouth 4 (four) times daily -  before meals and at bedtime. (Patient not taking: Reported on 03/24/2017), Disp: 40 tablet, Rfl: 0 Continue all other maintenance medications as listed above.  Follow up plan: No Follow-up on file.  Educational handout given for [place] PA-C [facility] [address]  [place], [place] [address] [phone]   03/24/2017, 11:47 AM

## 2017-03-24 NOTE — Patient Instructions (Signed)
In a few days you may receive a survey in the mail or online from [employer] regarding your visit with us today. Please take a moment to fill this out. Your feedback is very important to our whole office. It can help us better understand your needs as well as improve your experience and satisfaction. Thank you for taking your time to complete it. We care about you.  Kaydyn Chism, PA-C  

## 2017-06-24 ENCOUNTER — Encounter: Payer: Self-pay | Admitting: Physician Assistant

## 2017-06-24 ENCOUNTER — Ambulatory Visit (INDEPENDENT_AMBULATORY_CARE_PROVIDER_SITE_OTHER): Payer: Self-pay | Admitting: Physician Assistant

## 2017-06-24 VITALS — BP 115/78 | HR 86 | Temp 97.4°F | Ht 67.0 in | Wt 260.4 lb

## 2017-06-24 DIAGNOSIS — M5137 Other intervertebral disc degeneration, lumbosacral region: Secondary | ICD-10-CM

## 2017-06-24 DIAGNOSIS — S32009K Unspecified fracture of unspecified lumbar vertebra, subsequent encounter for fracture with nonunion: Secondary | ICD-10-CM

## 2017-06-24 DIAGNOSIS — G894 Chronic pain syndrome: Secondary | ICD-10-CM

## 2017-06-24 DIAGNOSIS — F411 Generalized anxiety disorder: Secondary | ICD-10-CM

## 2017-06-24 DIAGNOSIS — F410 Panic disorder [episodic paroxysmal anxiety] without agoraphobia: Secondary | ICD-10-CM

## 2017-06-24 DIAGNOSIS — R601 Generalized edema: Secondary | ICD-10-CM

## 2017-06-24 MED ORDER — CITALOPRAM HYDROBROMIDE 20 MG PO TABS: 20.0000 mg | ORAL_TABLET | Freq: Every day | ORAL | 5 refills | Status: DC

## 2017-06-24 NOTE — Patient Instructions (Signed)
In a few days you may receive a survey in the mail or online from [employer] regarding your visit with us today. Please take a moment to fill this out. Your feedback is very important to our whole office. It can help us better understand your needs as well as improve your experience and satisfaction. Thank you for taking your time to complete it. We care about you.  Cambree Hendrix, PA-C  
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
## 2017-06-26 DIAGNOSIS — F410 Panic disorder [episodic paroxysmal anxiety] without agoraphobia: Secondary | ICD-10-CM | POA: Insufficient documentation

## 2017-06-26 DIAGNOSIS — F411 Generalized anxiety disorder: Secondary | ICD-10-CM | POA: Insufficient documentation

## 2017-06-26 HISTORY — DX: Generalized anxiety disorder: F41.1

## 2017-06-26 HISTORY — DX: Panic disorder (episodic paroxysmal anxiety): F41.0

## 2017-07-07 ENCOUNTER — Other Ambulatory Visit: Payer: Self-pay | Admitting: Physician Assistant

## 2017-07-08 ENCOUNTER — Telehealth: Payer: Self-pay | Admitting: Physician Assistant

## 2017-07-08 ENCOUNTER — Other Ambulatory Visit: Payer: Self-pay | Admitting: Physician Assistant

## 2017-07-08 ENCOUNTER — Ambulatory Visit: Payer: Self-pay | Admitting: Family Medicine

## 2017-07-08 MED ORDER — AMOXICILLIN 500 MG PO CAPS: 500.0000 mg | ORAL_CAPSULE | Freq: Three times a day (TID) | ORAL | 0 refills | Status: DC

## 2017-07-08 NOTE — Telephone Encounter (Signed)
Amoxicillin sent

## 2017-07-08 NOTE — Telephone Encounter (Signed)
Aware of medication.  Pm appointment was cancelled.

## 2017-08-03 ENCOUNTER — Other Ambulatory Visit: Payer: Self-pay | Admitting: Physician Assistant

## 2017-08-03 MED ORDER — AZITHROMYCIN 250 MG PO TABS: ORAL_TABLET | ORAL | 0 refills | Status: DC

## 2017-08-03 MED ORDER — BENZONATATE 100 MG PO CAPS: 200.0000 mg | ORAL_CAPSULE | Freq: Three times a day (TID) | ORAL | 0 refills | Status: DC | PRN

## 2017-08-07 ENCOUNTER — Telehealth: Payer: Self-pay | Admitting: Physician Assistant

## 2017-08-07 NOTE — Telephone Encounter (Signed)
Appointment given for Monday with Glenard Haring

## 2017-08-10 ENCOUNTER — Ambulatory Visit (INDEPENDENT_AMBULATORY_CARE_PROVIDER_SITE_OTHER): Payer: Self-pay | Admitting: Physician Assistant

## 2017-08-10 ENCOUNTER — Encounter: Payer: Self-pay | Admitting: Physician Assistant

## 2017-08-10 VITALS — BP 116/82 | HR 94 | Temp 97.1°F | Ht 67.0 in | Wt 258.4 lb

## 2017-08-10 DIAGNOSIS — J40 Bronchitis, not specified as acute or chronic: Secondary | ICD-10-CM

## 2017-08-10 MED ORDER — ALBUTEROL SULFATE HFA 108 (90 BASE) MCG/ACT IN AERS: 2.0000 | INHALATION_SPRAY | Freq: Four times a day (QID) | RESPIRATORY_TRACT | 0 refills | Status: DC | PRN

## 2017-08-10 MED ORDER — PREDNISONE 10 MG PO TABS: 10.0000 mg | ORAL_TABLET | Freq: Every day | ORAL | 0 refills | Status: DC

## 2017-08-10 MED ORDER — HYDROCODONE-HOMATROPINE 5-1.5 MG/5ML PO SYRP: 5.0000 mL | ORAL_SOLUTION | Freq: Four times a day (QID) | ORAL | 0 refills | Status: DC | PRN

## 2017-08-10 NOTE — Patient Instructions (Signed)
In a few days you may receive a survey in the mail or online from [employer] regarding your visit with us today. Please take a moment to fill this out. Your feedback is very important to our whole office. It can help us better understand your needs as well as improve your experience and satisfaction. Thank you for taking your time to complete it. We care about you.  Chidera Dearcos, PA-C  

## 2017-08-10 NOTE — Progress Notes (Signed)
BP 116/82   Pulse 94   Temp (!) 97.1 F (36.2 C) (Oral)   Ht 5\' 7"  (1.702 m)   Wt 258 lb 6.4 oz (117.2 kg)   LMP 02/24/2013   SpO2 99%   BMI 40.47 kg/m    Subjective:    Patient ID: Cheryl Randall, female    DOB: 13-Apr-1976, 42 y.o.   MRN: [id number]  HPI: Cheryl Randall is a 42 y.o. female presenting on 08/10/2017 for Cough (x 2 weeks) and Shortness of Breath Patient with several days of progressing upper respiratory and bronchial symptoms. Initially there was more upper respiratory congestion. This progressed to having significant cough that is productive throughout the day and severe at night. There is occasional wheezing after coughing. Sometimes there is slight dyspnea on exertion. It is productive mucus that is yellow in color. Denies any blood.  Past Medical History:  Diagnosis Date  . Anxiety   . DDD (degenerative disc disease)   . Diabetes mellitus without complication ([facility])    borderline; diet controlled  . Family history of adverse reaction to anesthesia    Patients son has bad N/V  . Migraine   . PONV (postoperative nausea and vomiting)    Relevant past medical, surgical, family and social history reviewed and updated as indicated. Interim medical history since our last visit reviewed. Allergies and medications reviewed and updated. DATA REVIEWED: CHART IN EPIC  Family History reviewed for pertinent findings.  Review of Systems  Constitutional: Positive for chills and fatigue. Negative for activity change and appetite change.  HENT: Positive for congestion, postnasal drip and sore throat.   Eyes: Negative.   Respiratory: Positive for cough and wheezing.   Cardiovascular: Negative.  Negative for chest pain, palpitations and leg swelling.  Gastrointestinal: Negative.   Genitourinary: Negative.   Musculoskeletal: Negative.   Skin: Negative.   Neurological: Positive for headaches.    Allergies as of 08/10/2017      Reactions   Codeine Itching, Rash   Latex  Itching, Rash   Norco [hydrocodone-acetaminophen] Itching, Rash   Tape Itching, Rash      Medication List        Accurate as of 08/10/17  2:14 PM. Always use your most recent med list.          acetaminophen 325 MG tablet Commonly known as:  TYLENOL Take 650 mg by mouth every 6 (six) hours as needed for mild pain.   albuterol 108 (90 Base) MCG/ACT inhaler Commonly known as:  PROVENTIL HFA;VENTOLIN HFA Inhale 2 puffs into the lungs every 6 (six) hours as needed for wheezing or shortness of breath.   citalopram 20 MG tablet Commonly known as:  CELEXA Take 1 tablet (20 mg total) by mouth daily.   dicyclomine 20 MG tablet Commonly known as:  BENTYL Take 1 tablet (20 mg total) by mouth 4 (four) times daily -  before meals and at bedtime.   furosemide 20 MG tablet Commonly known as:  LASIX Take 1 tablet (20 mg total) by mouth daily.   HYDROcodone-homatropine 5-1.5 MG/5ML syrup Commonly known as:  HYCODAN Take 5-10 mLs by mouth every 6 (six) hours as needed.   HYDROmorphone 4 MG tablet Commonly known as:  DILAUDID Take 1 tablet (4 mg total) by mouth every 4 (four) hours as needed for severe pain.   ibuprofen 800 MG tablet Commonly known as:  ADVIL,MOTRIN Take 1 tablet (800 mg total) by mouth every 8 (eight) hours as needed.  ondansetron 4 MG tablet Commonly known as:  ZOFRAN Take 1 tablet (4 mg total) by mouth every 8 (eight) hours as needed.   predniSONE 10 MG tablet Commonly known as:  DELTASONE Take 1 tablet (10 mg total) by mouth daily with breakfast.   SUMAtriptan 50 MG tablet Commonly known as:  IMITREX TAKE 1 TABLET AT START OF HEADACHE REPEAT IN 4 HOURS IF NEEDED   traZODone 50 MG tablet Commonly known as:  DESYREL Take 1-2 tablets (50-100 mg total) by mouth at bedtime as needed for sleep.          Objective:    BP 116/82   Pulse 94   Temp (!) 97.1 F (36.2 C) (Oral)   Ht 5\' 7"  (1.702 m)   Wt 258 lb 6.4 oz (117.2 kg)   LMP 02/24/2013   SpO2  99%   BMI 40.47 kg/m   Allergies  Allergen Reactions  . Codeine Itching and Rash  . Latex Itching and Rash  . Norco [Hydrocodone-Acetaminophen] Itching and Rash  . Tape Itching and Rash    Wt Readings from Last 3 Encounters:  08/10/17 258 lb 6.4 oz (117.2 kg)  06/24/17 260 lb 6.4 oz (118.1 kg)  03/24/17 256 lb (116.1 kg)    Physical Exam  Constitutional: She is oriented to person, place, and time. She appears well-developed and well-nourished.  HENT:  Head: Normocephalic and atraumatic.  Right Ear: There is drainage and tenderness.  Left Ear: There is drainage and tenderness.  Nose: Mucosal edema and rhinorrhea present. Right sinus exhibits no maxillary sinus tenderness and no frontal sinus tenderness. Left sinus exhibits no maxillary sinus tenderness and no frontal sinus tenderness.  Mouth/Throat: Oropharyngeal exudate and posterior oropharyngeal erythema present.  Eyes: Conjunctivae and EOM are normal. Pupils are equal, round, and reactive to light.  Neck: Normal range of motion. Neck supple.  Cardiovascular: Normal rate, regular rhythm, normal heart sounds and intact distal pulses.  Pulmonary/Chest: Effort normal. She has wheezes in the right upper field and the left upper field.  Abdominal: Soft. Bowel sounds are normal.  Neurological: She is alert and oriented to person, place, and time. She has normal reflexes.  Skin: Skin is warm and dry. No rash noted.  Psychiatric: She has a normal mood and affect. Her behavior is normal. Judgment and thought content normal.        Assessment & Plan:   1. Bronchitis - albuterol (PROVENTIL HFA;VENTOLIN HFA) 108 (90 Base) MCG/ACT inhaler; Inhale 2 puffs into the lungs every 6 (six) hours as needed for wheezing or shortness of breath.  Dispense: 1 Inhaler; Refill: 0 - predniSONE (DELTASONE) 10 MG tablet; Take 1 tablet (10 mg total) by mouth daily with breakfast.  Dispense: 20 tablet; Refill: 0 - HYDROcodone-homatropine (HYCODAN) 5-1.5  MG/5ML syrup; Take 5-10 mLs by mouth every 6 (six) hours as needed.  Dispense: 240 mL; Refill: 0   Continue all other maintenance medications as listed above.  Follow up plan: No Follow-up on file.  Educational handout given for Gandy PA-C [facility] [address]  [place], [place] [address] [phone]   08/10/2017, 2:14 PM

## 2017-08-21 ENCOUNTER — Encounter: Payer: Self-pay | Admitting: Physician Assistant

## 2017-08-21 ENCOUNTER — Ambulatory Visit (INDEPENDENT_AMBULATORY_CARE_PROVIDER_SITE_OTHER): Payer: Self-pay | Admitting: Physician Assistant

## 2017-08-21 VITALS — BP 130/88 | HR 89 | Temp 98.1°F | Ht 67.0 in | Wt 258.0 lb

## 2017-08-21 DIAGNOSIS — M5137 Other intervertebral disc degeneration, lumbosacral region: Secondary | ICD-10-CM

## 2017-08-21 DIAGNOSIS — M4626 Osteomyelitis of vertebra, lumbar region: Secondary | ICD-10-CM | POA: Insufficient documentation

## 2017-08-21 DIAGNOSIS — T8142XA Infection following a procedure, deep incisional surgical site, initial encounter: Secondary | ICD-10-CM

## 2017-08-21 DIAGNOSIS — S32009K Unspecified fracture of unspecified lumbar vertebra, subsequent encounter for fracture with nonunion: Secondary | ICD-10-CM

## 2017-08-21 DIAGNOSIS — M869 Osteomyelitis, unspecified: Secondary | ICD-10-CM

## 2017-08-21 HISTORY — DX: Osteomyelitis of vertebra, lumbar region: M46.26

## 2017-08-21 NOTE — Patient Instructions (Signed)
In a few days you may receive a survey in the mail or online from [employer] regarding your visit with us today. Please take a moment to fill this out. Your feedback is very important to our whole office. It can help us better understand your needs as well as improve your experience and satisfaction. Thank you for taking your time to complete it. We care about you.  Lenae Wherley, PA-C  

## 2017-08-23 NOTE — Progress Notes (Signed)
BP 130/88   Pulse 89   Temp 98.1 F (36.7 C) (Oral)   Ht 5\' 7"  (1.702 m)   Wt 258 lb (117 kg)   LMP 02/24/2013   BMI 40.41 kg/m    Subjective:    Patient ID: Cheryl Randall, female    DOB: 1976-03-02, 42 y.o.   MRN: [id number]  HPI: Cheryl Randall is a 42 y.o. female presenting on 08/21/2017 for Cough and forms Still unable to work or stand for any duration of time, Her left lumbar and radiculopathy is worse. After standing a while gets much worse.  This patient comes in for periodic recheck on medications and conditions.  All medications are reviewed today. There are no reports of any problems with the medications. All of the medical conditions are reviewed and updated.  Lab work is reviewed and will be ordered as medically necessary. There are no new problems reported with today's visit.    Past Medical History:  Diagnosis Date  . Anxiety   . DDD (degenerative disc disease)   . Diabetes mellitus without complication ([place])    borderline; diet controlled  . Family history of adverse reaction to anesthesia    Patients son has bad N/V  . Migraine   . PONV (postoperative nausea and vomiting)    Relevant past medical, surgical, family and social history reviewed and updated as indicated. Interim medical history since our last visit reviewed. Allergies and medications reviewed and updated. DATA REVIEWED: CHART IN EPIC  Family History reviewed for pertinent findings.  Review of Systems  Constitutional: Negative.   HENT: Negative.   Eyes: Negative.   Respiratory: Positive for cough.   Gastrointestinal: Negative.   Genitourinary: Negative.   Musculoskeletal: Positive for arthralgias, back pain, gait problem, joint swelling and myalgias.    Allergies as of 08/21/2017      Reactions   Codeine Itching, Rash   Latex Itching, Rash   Norco [hydrocodone-acetaminophen] Itching, Rash   Tape Itching, Rash      Medication List        Accurate as of 08/21/17 11:59 PM. Always  use your most recent med list.          acetaminophen 325 MG tablet Commonly known as:  TYLENOL Take 650 mg by mouth every 6 (six) hours as needed for mild pain.   albuterol 108 (90 Base) MCG/ACT inhaler Commonly known as:  PROVENTIL HFA;VENTOLIN HFA Inhale 2 puffs into the lungs every 6 (six) hours as needed for wheezing or shortness of breath.   citalopram 20 MG tablet Commonly known as:  CELEXA Take 1 tablet (20 mg total) by mouth daily.   dicyclomine 20 MG tablet Commonly known as:  BENTYL Take 1 tablet (20 mg total) by mouth 4 (four) times daily -  before meals and at bedtime.   furosemide 20 MG tablet Commonly known as:  LASIX Take 1 tablet (20 mg total) by mouth daily.   HYDROcodone-homatropine 5-1.5 MG/5ML syrup Commonly known as:  HYCODAN Take 5-10 mLs by mouth every 6 (six) hours as needed.   HYDROmorphone 4 MG tablet Commonly known as:  DILAUDID Take 1 tablet (4 mg total) by mouth every 4 (four) hours as needed for severe pain.   ibuprofen 800 MG tablet Commonly known as:  ADVIL,MOTRIN Take 1 tablet (800 mg total) by mouth every 8 (eight) hours as needed.   ondansetron 4 MG tablet Commonly known as:  ZOFRAN Take 1 tablet (4 mg total) by mouth every  8 (eight) hours as needed.   predniSONE 10 MG tablet Commonly known as:  DELTASONE Take 1 tablet (10 mg total) by mouth daily with breakfast.   SUMAtriptan 50 MG tablet Commonly known as:  IMITREX TAKE 1 TABLET AT START OF HEADACHE REPEAT IN 4 HOURS IF NEEDED   traZODone 50 MG tablet Commonly known as:  DESYREL Take 1-2 tablets (50-100 mg total) by mouth at bedtime as needed for sleep.          Objective:    BP 130/88   Pulse 89   Temp 98.1 F (36.7 C) (Oral)   Ht 5\' 7"  (1.702 m)   Wt 258 lb (117 kg)   LMP 02/24/2013   BMI 40.41 kg/m   Allergies  Allergen Reactions  . Codeine Itching and Rash  . Latex Itching and Rash  . Norco [Hydrocodone-Acetaminophen] Itching and Rash  . Tape Itching  and Rash    Wt Readings from Last 3 Encounters:  08/21/17 258 lb (117 kg)  08/10/17 258 lb 6.4 oz (117.2 kg)  06/24/17 260 lb 6.4 oz (118.1 kg)    Physical Exam  Constitutional: She is oriented to person, place, and time. She appears well-developed and well-nourished.  HENT:  Head: Normocephalic and atraumatic.  Eyes: Pupils are equal, round, and reactive to light. Conjunctivae and EOM are normal.  Cardiovascular: Normal rate, regular rhythm, normal heart sounds and intact distal pulses.  Pulmonary/Chest: Effort normal and breath sounds normal.  Abdominal: Soft. Bowel sounds are normal.  Musculoskeletal:       Lumbar back: She exhibits decreased range of motion, tenderness, pain and spasm.  Neurological: She is alert and oriented to person, place, and time. She has normal reflexes. She exhibits abnormal muscle tone.  Skin: Skin is warm and dry. No rash noted.  Psychiatric: She has a normal mood and affect. Her behavior is normal. Judgment and thought content normal.        Assessment & Plan:   1. Deep incisional surgical site infection  2. Pseudoarthrosis of lumbar spine  3. DDD (degenerative disc disease), lumbosacral  4. 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[facility])   Continue all other maintenance medications as listed above.  Follow up plan: Return if symptoms worsen or fail to improve.  Educational handout given for Vance PA-C [facility] [address]  [place], [place] [address] [phone]   08/23/2017, 4:54 PM

## 2017-09-16 ENCOUNTER — Other Ambulatory Visit: Payer: Self-pay | Admitting: Physician Assistant

## 2017-09-16 DIAGNOSIS — F411 Generalized anxiety disorder: Secondary | ICD-10-CM

## 2017-09-21 ENCOUNTER — Other Ambulatory Visit: Payer: Self-pay | Admitting: Physician Assistant

## 2017-09-21 DIAGNOSIS — F411 Generalized anxiety disorder: Secondary | ICD-10-CM

## 2017-09-22 MED ORDER — CITALOPRAM HYDROBROMIDE 20 MG PO TABS: 20.0000 mg | ORAL_TABLET | Freq: Every day | ORAL | 0 refills | Status: DC

## 2017-09-22 NOTE — Telephone Encounter (Signed)
OV 10/22/17 

## 2017-10-22 ENCOUNTER — Encounter: Payer: Self-pay | Admitting: Physician Assistant

## 2017-10-22 ENCOUNTER — Ambulatory Visit (INDEPENDENT_AMBULATORY_CARE_PROVIDER_SITE_OTHER): Payer: Self-pay | Admitting: Physician Assistant

## 2017-10-22 VITALS — BP 129/83 | HR 89 | Ht 67.0 in | Wt 267.0 lb

## 2017-10-22 DIAGNOSIS — F411 Generalized anxiety disorder: Secondary | ICD-10-CM

## 2017-10-22 DIAGNOSIS — R635 Abnormal weight gain: Secondary | ICD-10-CM

## 2017-10-22 DIAGNOSIS — S32009K Unspecified fracture of unspecified lumbar vertebra, subsequent encounter for fracture with nonunion: Secondary | ICD-10-CM

## 2017-10-22 DIAGNOSIS — M5137 Other intervertebral disc degeneration, lumbosacral region: Secondary | ICD-10-CM

## 2017-10-22 DIAGNOSIS — M869 Osteomyelitis, unspecified: Secondary | ICD-10-CM

## 2017-10-22 DIAGNOSIS — M4626 Osteomyelitis of vertebra, lumbar region: Secondary | ICD-10-CM

## 2017-10-22 MED ORDER — CITALOPRAM HYDROBROMIDE 40 MG PO TABS: 40.0000 mg | ORAL_TABLET | Freq: Every day | ORAL | 5 refills | Status: DC

## 2017-10-22 MED ORDER — PHENTERMINE HCL 37.5 MG PO TABS: 37.5000 mg | ORAL_TABLET | Freq: Every day | ORAL | 1 refills | Status: DC

## 2017-10-27 NOTE — Progress Notes (Signed)
BP 129/83   Pulse 89   Ht 5\' 7"  (1.702 m)   Wt 267 lb (121.1 kg)   LMP 02/24/2013   BMI 41.82 kg/m    Subjective:    Patient ID: Cheryl Randall, female    DOB: July 02, 1975, 42 y.o.   MRN: [id number]  HPI: Cheryl Randall is a 42 y.o. female presenting on 10/22/2017 for Anxiety  Patient comes in for recheck on her anxiety and chronic back pain.  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  She is trying hard not to use any pain medication and she barely uses any.  She is only been given one prescription in the past 12 months.  She is unable to tolerate gabapentin at all.  She was unable to tolerate Cymbalta.  Celexa is a 20 mg currently.  She does report some increased symptoms of anxiety and depression.    Past Medical History:  Diagnosis Date  . Anxiety   . DDD (degenerative disc disease)   . Diabetes mellitus without complication ([facility])    borderline; diet controlled  . Family history of adverse reaction to anesthesia    Patients son has bad N/V  . Migraine   . PONV (postoperative nausea and vomiting)    Relevant past medical, surgical, family and social history reviewed and updated as indicated. Interim medical history since our last visit reviewed. Allergies and medications reviewed and updated. DATA REVIEWED: CHART IN EPIC  Family History reviewed for pertinent findings.  Review of Systems  Constitutional: Negative.  Negative for activity change, fatigue and fever.  HENT: Negative.   Eyes: Negative.   Respiratory: Negative.  Negative for cough.   Cardiovascular: Negative.  Negative for chest pain.  Gastrointestinal: Negative.  Negative for abdominal pain.  Endocrine: Negative.   Genitourinary: Negative.  Negative for dysuria.  Musculoskeletal: Positive for arthralgias, back pain, gait problem and joint swelling.  Skin: Negative.   Psychiatric/Behavioral: Positive for decreased  concentration. The patient is nervous/anxious.     Allergies as of 10/22/2017      Reactions   Codeine Itching, Rash   Latex Itching, Rash   Norco [hydrocodone-acetaminophen] Itching, Rash   Tape Itching, Rash      Medication List        Accurate as of 10/22/17 11:59 PM. Always use your most recent med list.          acetaminophen 325 MG tablet Commonly known as:  TYLENOL Take 650 mg by mouth every 6 (six) hours as needed for mild pain.   albuterol 108 (90 Base) MCG/ACT inhaler Commonly known as:  PROVENTIL HFA;VENTOLIN HFA Inhale 2 puffs into the lungs every 6 (six) hours as needed for wheezing or shortness of breath.   citalopram 40 MG tablet Commonly known as:  CELEXA Take 1 tablet (40 mg total) by mouth daily.   dicyclomine 20 MG tablet Commonly known as:  BENTYL Take 1 tablet (20 mg total) by mouth 4 (four) times daily -  before meals and at bedtime.   furosemide 20 MG tablet Commonly known as:  LASIX Take 1 tablet (20 mg total) by mouth daily.   HYDROcodone-homatropine 5-1.5 MG/5ML syrup Commonly known as:  HYCODAN Take 5-10 mLs by mouth every 6 (six) hours as needed.   HYDROmorphone 4 MG tablet Commonly known as:  DILAUDID Take 1 tablet (4 mg total) by mouth every 4 (four) hours as needed  for severe pain.   ibuprofen 800 MG tablet Commonly known as:  ADVIL,MOTRIN Take 1 tablet (800 mg total) by mouth every 8 (eight) hours as needed.   ondansetron 4 MG tablet Commonly known as:  ZOFRAN Take 1 tablet (4 mg total) by mouth every 8 (eight) hours as needed.   phentermine 37.5 MG tablet Commonly known as:  ADIPEX-P Take 1 tablet (37.5 mg total) by mouth daily before breakfast.   SUMAtriptan 50 MG tablet Commonly known as:  IMITREX TAKE 1 TABLET AT START OF HEADACHE REPEAT IN 4 HOURS IF NEEDED   traZODone 50 MG tablet Commonly known as:  DESYREL Take 1-2 tablets (50-100 mg total) by mouth at bedtime as needed for sleep.          Objective:    BP  129/83   Pulse 89   Ht 5\' 7"  (1.702 m)   Wt 267 lb (121.1 kg)   LMP 02/24/2013   BMI 41.82 kg/m   Allergies  Allergen Reactions  . Codeine Itching and Rash  . Latex Itching and Rash  . Norco [Hydrocodone-Acetaminophen] Itching and Rash  . Tape Itching and Rash    Wt Readings from Last 3 Encounters:  10/22/17 267 lb (121.1 kg)  08/21/17 258 lb (117 kg)  08/10/17 258 lb 6.4 oz (117.2 kg)    Physical Exam  Constitutional: She is oriented to person, place, and time. She appears well-developed and well-nourished.  HENT:  Head: Normocephalic and atraumatic.  Eyes: Pupils are equal, round, and reactive to light. Conjunctivae and EOM are normal.  Cardiovascular: Normal rate, regular rhythm, normal heart sounds and intact distal pulses.  Pulmonary/Chest: Effort normal and breath sounds normal.  Abdominal: Soft. Bowel sounds are normal.  Musculoskeletal:       Lumbar back: She exhibits decreased range of motion, tenderness, pain and spasm.  Neurological: She is alert and oriented to person, place, and time. She has normal reflexes.  Skin: Skin is warm and dry. No rash noted.  Psychiatric: She has a normal mood and affect. Her behavior is normal. Judgment and thought content normal.        Assessment & Plan:   1. Pseudoarthrosis of lumbar spine Ibuprofen Rest, stretching PT  2. DDD (degenerative disc disease), lumbosacral Ibuprofen Rest, stretching PT  3. 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[facility]) Resolved infection, sequelae of chronic pain and disabilty  4. GAD (generalized anxiety disorder) - citalopram (CELEXA) 40 MG tablet; Take 1 tablet (40 mg total) by mouth daily.  Dispense: 30 tablet; Refill: 5  5. Weight gain - phentermine (ADIPEX-P) 37.5 MG tablet; Take 1 tablet (37.5 mg total) by mouth daily before breakfast.  Dispense: 30 tablet; Refill: 1   Continue all other maintenance medications as listed above.  Follow up plan: Return in about 1 month (around 11/19/2017) for  recheck.  Educational handout given for [place] PA-C [facility] [address]  [place], [place] [address] [phone]   10/27/2017, 1:25 PM

## 2017-11-06 ENCOUNTER — Encounter: Payer: Self-pay | Admitting: Physician Assistant

## 2017-11-06 ENCOUNTER — Ambulatory Visit (INDEPENDENT_AMBULATORY_CARE_PROVIDER_SITE_OTHER): Payer: Self-pay | Admitting: Physician Assistant

## 2017-11-06 VITALS — BP 114/77 | HR 78 | Temp 96.8°F | Ht 67.0 in | Wt 259.4 lb

## 2017-11-06 DIAGNOSIS — Z0000 Encounter for general adult medical examination without abnormal findings: Secondary | ICD-10-CM

## 2017-11-06 DIAGNOSIS — R61 Generalized hyperhidrosis: Secondary | ICD-10-CM

## 2017-11-07 LAB — CBC WITH DIFFERENTIAL/PLATELET
BASOS: 1 %
Basophils Absolute: 0 10*3/uL (ref 0.0–0.2)
EOS (ABSOLUTE): 0.1 10*3/uL (ref 0.0–0.4)
EOS: 2 %
HEMOGLOBIN: 13.1 g/dL (ref 11.1–15.9)
Hematocrit: 40.2 % (ref 34.0–46.6)
Immature Grans (Abs): 0 10*3/uL (ref 0.0–0.1)
Immature Granulocytes: 0 %
LYMPHS: 21 %
Lymphocytes Absolute: 1.1 10*3/uL (ref 0.7–3.1)
MCH: 28.1 pg (ref 26.6–33.0)
MCHC: 32.6 g/dL (ref 31.5–35.7)
MCV: 86 fL (ref 79–97)
MONOCYTES: 10 %
Monocytes Absolute: 0.5 10*3/uL (ref 0.1–0.9)
NEUTROS ABS: 3.3 10*3/uL (ref 1.4–7.0)
Neutrophils: 66 %
Platelets: 221 10*3/uL (ref 150–450)
RBC: 4.66 x10E6/uL (ref 3.77–5.28)
RDW: 13.8 % (ref 12.3–15.4)
WBC: 5 10*3/uL (ref 3.4–10.8)

## 2017-11-07 LAB — LIPID PANEL
Chol/HDL Ratio: 2.5 ratio (ref 0.0–4.4)
Cholesterol, Total: 121 mg/dL (ref 100–199)
HDL: 48 mg/dL (ref >39)
LDL Calculated: 62 mg/dL (ref 0–99)
TRIGLYCERIDES: 53 mg/dL (ref 0–149)
VLDL Cholesterol Cal: 11 mg/dL (ref 5–40)

## 2017-11-07 LAB — CMP14+EGFR
A/G RATIO: 1.6 (ref 1.2–2.2)
ALT: 6 IU/L (ref 0–32)
AST: 11 IU/L (ref 0–40)
Albumin: 4.1 g/dL (ref 3.5–5.5)
Alkaline Phosphatase: 63 IU/L (ref 39–117)
BILIRUBIN TOTAL: 0.4 mg/dL (ref 0.0–1.2)
BUN/Creatinine Ratio: 22 (ref 9–23)
BUN: 19 mg/dL (ref 6–24)
CALCIUM: 8.9 mg/dL (ref 8.7–10.2)
CHLORIDE: 104 mmol/L (ref 96–106)
CO2: 22 mmol/L (ref 20–29)
Creatinine, Ser: 0.85 mg/dL (ref 0.57–1.00)
GFR calc Af Amer: 98 mL/min/{1.73_m2} (ref >59)
GFR calc non Af Amer: 85 mL/min/{1.73_m2} (ref >59)
Globulin, Total: 2.6 g/dL (ref 1.5–4.5)
Glucose: 107 mg/dL — ABNORMAL HIGH (ref 65–99)
POTASSIUM: 4.2 mmol/L (ref 3.5–5.2)
Sodium: 138 mmol/L (ref 134–144)
Total Protein: 6.7 g/dL (ref 6.0–8.5)

## 2017-11-09 NOTE — Progress Notes (Signed)
BP 114/77   Pulse 78   Temp (!) 96.8 F (36 C) (Oral)   Ht 5' 7"  (1.702 m)   Wt 259 lb 6.4 oz (117.7 kg)   LMP 02/24/2013   BMI 40.63 kg/m     Subjective:    Patient ID: Cheryl Randall, female    DOB: 1976/04/28, 42 y.o.   MRN: [id number]  HPI: Cheryl Randall is a 42 y.o. female presenting on 11/06/2017 for Hot Flashes  This patient comes in complaining of significant hot flashes.  Many years ago she had a hysterectomy.  She did not have any menopausal symptoms following that.  This is really just been in the past year or so.  She thinks that it also may be linked to her Celexa.  She reports that she is doing so well on this medication she really does not want to stop it if at all possible.  We will draw some labs today and check her sugar kidney and liver functions.  But at this time there is no other recommendations of medicine changes.  Past Medical History:  Diagnosis Date  . Anxiety   . DDD (degenerative disc disease)   . Diabetes mellitus without complication ([place])    borderline; diet controlled  . Family history of adverse reaction to anesthesia    Patients son has bad N/V  . Migraine   . PONV (postoperative nausea and vomiting)    Relevant past medical, surgical, family and social history reviewed and updated as indicated. Interim medical history since our last visit reviewed. Allergies and medications reviewed and updated. DATA REVIEWED: CHART IN EPIC  Family History reviewed for pertinent findings.  Review of Systems  Constitutional: Negative.   HENT: Negative.   Eyes: Negative.   Respiratory: Negative.   Gastrointestinal: Negative.   Endocrine: Positive for heat intolerance. Negative for polydipsia, polyphagia and polyuria.  Genitourinary: Negative.  Negative for menstrual problem, pelvic pain and vaginal bleeding.  Neurological: Negative for dizziness and light-headedness.    Allergies as of 11/06/2017      Reactions   Codeine Itching, Rash   Latex  Itching, Rash   Norco [hydrocodone-acetaminophen] Itching, Rash   Tape Itching, Rash      Medication List        Accurate as of 11/06/17 11:59 PM. Always use your most recent med list.          acetaminophen 325 MG tablet Commonly known as:  TYLENOL Take 650 mg by mouth every 6 (six) hours as needed for mild pain.   albuterol 108 (90 Base) MCG/ACT inhaler Commonly known as:  PROVENTIL HFA;VENTOLIN HFA Inhale 2 puffs into the lungs every 6 (six) hours as needed for wheezing or shortness of breath.   citalopram 40 MG tablet Commonly known as:  CELEXA Take 1 tablet (40 mg total) by mouth daily.   dicyclomine 20 MG tablet Commonly known as:  BENTYL Take 1 tablet (20 mg total) by mouth 4 (four) times daily -  before meals and at bedtime.   furosemide 20 MG tablet Commonly known as:  LASIX Take 1 tablet (20 mg total) by mouth daily.   HYDROcodone-homatropine 5-1.5 MG/5ML syrup Commonly known as:  HYCODAN Take 5-10 mLs by mouth every 6 (six) hours as needed.   HYDROmorphone 4 MG tablet Commonly known as:  DILAUDID Take 1 tablet (4 mg total) by mouth every 4 (four) hours as needed for severe pain.   ibuprofen 800 MG tablet Commonly known  as:  ADVIL,MOTRIN Take 1 tablet (800 mg total) by mouth every 8 (eight) hours as needed.   ondansetron 4 MG tablet Commonly known as:  ZOFRAN Take 1 tablet (4 mg total) by mouth every 8 (eight) hours as needed.   phentermine 37.5 MG tablet Commonly known as:  ADIPEX-P Take 1 tablet (37.5 mg total) by mouth daily before breakfast.   SUMAtriptan 50 MG tablet Commonly known as:  IMITREX TAKE 1 TABLET AT START OF HEADACHE REPEAT IN 4 HOURS IF NEEDED   traZODone 50 MG tablet Commonly known as:  DESYREL Take 1-2 tablets (50-100 mg total) by mouth at bedtime as needed for sleep.          Objective:    BP 114/77   Pulse 78   Temp (!) 96.8 F (36 C) (Oral)   Ht 5' 7"  (1.702 m)   Wt 259 lb 6.4 oz (117.7 kg)   LMP 02/24/2013   BMI  40.63 kg/m    Allergies  Allergen Reactions  . Codeine Itching and Rash  . Latex Itching and Rash  . Norco [Hydrocodone-Acetaminophen] Itching and Rash  . Tape Itching and Rash    Wt Readings from Last 3 Encounters:  11/06/17 259 lb 6.4 oz (117.7 kg)  10/22/17 267 lb (121.1 kg)  08/21/17 258 lb (117 kg)    Physical Exam  Constitutional: She is oriented to person, place, and time. She appears well-developed and well-nourished.  HENT:  Head: Normocephalic and atraumatic.  Eyes: Pupils are equal, round, and reactive to light. Conjunctivae and EOM are normal.  Cardiovascular: Normal rate, regular rhythm, normal heart sounds and intact distal pulses.  Pulmonary/Chest: Effort normal and breath sounds normal.  Abdominal: Soft. Bowel sounds are normal.  Neurological: She is alert and oriented to person, place, and time. She has normal reflexes.  Skin: Skin is warm and dry. No rash noted.  Psychiatric: She has a normal mood and affect. Her behavior is normal. Judgment and thought content normal.    Results for orders placed or performed in visit on 11/06/17  CMP14+EGFR  Result Value Ref Range   Glucose 107 (H) 65 - 99 mg/dL   BUN 19 6 - 24 mg/dL   Creatinine, Ser 0.85 0.57 - 1.00 mg/dL   GFR calc non Af Amer 85 >59 mL/min/1.73   GFR calc Af Amer 98 >59 mL/min/1.73   BUN/Creatinine Ratio 22 9 - 23   Sodium 138 134 - 144 mmol/L   Potassium 4.2 3.5 - 5.2 mmol/L   Chloride 104 96 - 106 mmol/L   CO2 22 20 - 29 mmol/L   Calcium 8.9 8.7 - 10.2 mg/dL   Total Protein 6.7 6.0 - 8.5 g/dL   Albumin 4.1 3.5 - 5.5 g/dL   Globulin, Total 2.6 1.5 - 4.5 g/dL   Albumin/Globulin Ratio 1.6 1.2 - 2.2   Bilirubin Total 0.4 0.0 - 1.2 mg/dL   Alkaline Phosphatase 63 39 - 117 IU/L   AST 11 0 - 40 IU/L   ALT 6 0 - 32 IU/L  Lipid panel  Result Value Ref Range   Cholesterol, Total 121 100 - 199 mg/dL   Triglycerides 53 0 - 149 mg/dL   HDL 48 >39 mg/dL   VLDL Cholesterol Cal 11 5 - 40 mg/dL   LDL  Calculated 62 0 - 99 mg/dL   Chol/HDL Ratio 2.5 0.0 - 4.4 ratio  CBC with Differential/Platelet  Result Value Ref Range   WBC 5.0 3.4 - 10.8 x10E3/uL  RBC 4.66 3.77 - 5.28 x10E6/uL   Hemoglobin 13.1 11.1 - 15.9 g/dL   Hematocrit 40.2 34.0 - 46.6 %   MCV 86 79 - 97 fL   MCH 28.1 26.6 - 33.0 pg   MCHC 32.6 31.5 - 35.7 g/dL   RDW 13.8 12.3 - 15.4 %   Platelets 221 150 - 450 x10E3/uL   Neutrophils 66 Not Estab. %   Lymphs 21 Not Estab. %   Monocytes 10 Not Estab. %   Eos 2 Not Estab. %   Basos 1 Not Estab. %   Neutrophils Absolute 3.3 1.4 - 7.0 x10E3/uL   Lymphocytes Absolute 1.1 0.7 - 3.1 x10E3/uL   Monocytes Absolute 0.5 0.1 - 0.9 x10E3/uL   EOS (ABSOLUTE) 0.1 0.0 - 0.4 x10E3/uL   Basophils Absolute 0.0 0.0 - 0.2 x10E3/uL   Immature Granulocytes 0 Not Estab. %   Immature Grans (Abs) 0.0 0.0 - 0.1 x10E3/uL      Assessment & Plan:   1. Night sweats Consider celexa Possibly menopausal symptoms  2. Well adult exam - CMP14+EGFR - Lipid panel - CBC with Differential/Platelet   Continue all other maintenance medications as listed above.  Follow up plan: No follow-ups on file.  Educational handout given for [place] PA-C [facility] [address]  [place], [place] [address] [phone]   11/09/2017, 11:22 AM

## 2017-11-19 ENCOUNTER — Other Ambulatory Visit: Payer: Self-pay | Admitting: Physician Assistant

## 2017-11-19 MED ORDER — SULFAMETHOXAZOLE-TRIMETHOPRIM 800-160 MG PO TABS: 1.0000 | ORAL_TABLET | Freq: Two times a day (BID) | ORAL | 0 refills | Status: DC

## 2017-11-24 ENCOUNTER — Ambulatory Visit: Payer: Self-pay | Admitting: Physician Assistant

## 2017-12-07 ENCOUNTER — Ambulatory Visit: Payer: Self-pay | Admitting: Physician Assistant

## 2017-12-08 ENCOUNTER — Encounter: Payer: Self-pay | Admitting: Physician Assistant

## 2017-12-11 ENCOUNTER — Other Ambulatory Visit: Payer: Self-pay | Admitting: Physician Assistant

## 2017-12-11 MED ORDER — AZITHROMYCIN 250 MG PO TABS: ORAL_TABLET | ORAL | 0 refills | Status: DC

## 2018-01-26 ENCOUNTER — Ambulatory Visit: Payer: Self-pay | Admitting: Physician Assistant

## 2018-02-15 ENCOUNTER — Telehealth: Payer: Self-pay | Admitting: Physician Assistant

## 2018-02-15 ENCOUNTER — Other Ambulatory Visit: Payer: Self-pay | Admitting: Physician Assistant

## 2018-02-15 MED ORDER — AZITHROMYCIN 250 MG PO TABS: ORAL_TABLET | ORAL | 0 refills | Status: DC

## 2018-02-15 NOTE — Telephone Encounter (Signed)
Prescription for Zithromax has been sent to her pharmacy.  It is the drugstore in [place].

## 2018-02-15 NOTE — Telephone Encounter (Signed)
What symptoms do you have? Congested headcold  How long have you been sick? Two days  Have you been seen for this problem? no  If your provider decides to give you a prescription, which pharmacy would you like for it to be sent to[facility] but wants to know if Glenard Haring will call in something first   Patient informed that this information will be sent to the clinical staff for review and that they should receive a follow up call.

## 2018-02-15 NOTE — Telephone Encounter (Signed)
Pt aware.

## 2018-03-22 ENCOUNTER — Other Ambulatory Visit: Payer: Self-pay | Admitting: Physician Assistant

## 2018-03-22 DIAGNOSIS — F411 Generalized anxiety disorder: Secondary | ICD-10-CM

## 2018-03-23 NOTE — Telephone Encounter (Signed)
Last seen 11/06/17

## 2018-04-16 ENCOUNTER — Other Ambulatory Visit: Payer: Self-pay | Admitting: Physician Assistant

## 2018-04-19 ENCOUNTER — Telehealth: Payer: Self-pay | Admitting: Physician Assistant

## 2018-04-19 ENCOUNTER — Other Ambulatory Visit: Payer: Self-pay | Admitting: Physician Assistant

## 2018-04-19 MED ORDER — VALACYCLOVIR HCL 1 G PO TABS: 1000.0000 mg | ORAL_TABLET | Freq: Two times a day (BID) | ORAL | 0 refills | Status: DC

## 2018-04-19 NOTE — Telephone Encounter (Signed)
Sent one script

## 2018-04-19 NOTE — Telephone Encounter (Signed)
Pt aware.

## 2018-04-19 NOTE — Telephone Encounter (Signed)
Pt requesting RX for Valtrex Please advise

## 2018-05-17 ENCOUNTER — Other Ambulatory Visit: Payer: Self-pay | Admitting: Physician Assistant

## 2018-05-17 MED ORDER — VALACYCLOVIR HCL 1 G PO TABS: 1000.0000 mg | ORAL_TABLET | Freq: Two times a day (BID) | ORAL | 0 refills | Status: DC

## 2018-06-22 ENCOUNTER — Ambulatory Visit: Payer: Self-pay | Admitting: Family

## 2018-06-22 ENCOUNTER — Ambulatory Visit (INDEPENDENT_AMBULATORY_CARE_PROVIDER_SITE_OTHER): Payer: Self-pay | Admitting: Physician Assistant

## 2018-06-22 ENCOUNTER — Encounter: Payer: Self-pay | Admitting: Physician Assistant

## 2018-06-22 VITALS — BP 126/81 | HR 82 | Temp 97.3°F | Ht 67.0 in | Wt 266.0 lb

## 2018-06-22 DIAGNOSIS — F411 Generalized anxiety disorder: Secondary | ICD-10-CM

## 2018-06-22 DIAGNOSIS — R300 Dysuria: Secondary | ICD-10-CM

## 2018-06-22 DIAGNOSIS — N3000 Acute cystitis without hematuria: Secondary | ICD-10-CM

## 2018-06-22 LAB — URINALYSIS, COMPLETE
Bilirubin, UA: NEGATIVE
Glucose, UA: NEGATIVE
Ketones, UA: NEGATIVE
Leukocytes, UA: NEGATIVE
Nitrite, UA: NEGATIVE
RBC, UA: NEGATIVE
Specific Gravity, UA: >1.03 — ABNORMAL HIGH (ref 1.005–1.030)
Urobilinogen, Ur: 0.2 mg/dL (ref 0.2–1.0)
pH, UA: 6 (ref 5.0–7.5)

## 2018-06-22 LAB — MICROSCOPIC EXAMINATION
RBC MICROSCOPIC, UA: NONE SEEN /HPF (ref 0–2)
Renal Epithel, UA: NONE SEEN /hpf

## 2018-06-22 MED ORDER — HYDROXYZINE PAMOATE 25 MG PO CAPS: 25.0000 mg | ORAL_CAPSULE | Freq: Three times a day (TID) | ORAL | 5 refills | Status: DC | PRN

## 2018-06-22 MED ORDER — SULFAMETHOXAZOLE-TRIMETHOPRIM 800-160 MG PO TABS: 1.0000 | ORAL_TABLET | Freq: Two times a day (BID) | ORAL | 0 refills | Status: DC

## 2018-06-23 LAB — URINE CULTURE: Organism ID, Bacteria: NO GROWTH

## 2018-06-24 NOTE — Progress Notes (Signed)
BP 126/81   Pulse 82   Temp (!) 97.3 F (36.3 C) (Oral)   Ht 5\' 7"  (1.702 m)   Wt 266 lb (120.7 kg)   LMP 02/24/2013   BMI 41.66 kg/m    Subjective:    Patient ID: Cheryl Randall, female    DOB: 12-09-1975, 43 y.o.   MRN: [id number]  HPI: Cheryl Randall is a 43 y.o. female presenting on 06/22/2018 for Urinary Tract Infection; Back Pain; Abdominal Pain; and Medication Refill  This patient has had several days of dysuria, frequency and nocturia. There is also pain over the bladder in the suprapubic region, no back pain. Denies leakage or hematuria.  Denies fever or chills. No pain in flank area.   Past Medical History:  Diagnosis Date  . Anxiety   . DDD (degenerative disc disease)   . Diabetes mellitus without complication ([place])    borderline; diet controlled  . Family history of adverse reaction to anesthesia    Patients son has bad N/V  . Migraine   . PONV (postoperative nausea and vomiting)    Relevant past medical, surgical, family and social history reviewed and updated as indicated. Interim medical history since our last visit reviewed. Allergies and medications reviewed and updated. DATA REVIEWED: CHART IN EPIC  Family History reviewed for pertinent findings.  Review of Systems  Constitutional: Negative.   HENT: Negative.   Eyes: Negative.   Respiratory: Negative.   Gastrointestinal: Negative.   Genitourinary: Positive for difficulty urinating, dysuria and urgency. Negative for flank pain.    Allergies as of 06/22/2018      Reactions   Codeine Itching, Rash   Latex Itching, Rash   Norco [hydrocodone-acetaminophen] Itching, Rash   Tape Itching, Rash      Medication List       Accurate as of June 22, 2018 11:59 PM. Always use your most recent med list.        acetaminophen 325 MG tablet Commonly known as:  TYLENOL Take 650 mg by mouth every 6 (six) hours as needed for mild pain.   albuterol 108 (90 Base) MCG/ACT inhaler Commonly known as:   PROVENTIL HFA;VENTOLIN HFA Inhale 2 puffs into the lungs every 6 (six) hours as needed for wheezing or shortness of breath.   citalopram 40 MG tablet Commonly known as:  CELEXA TAKE ONE (1) TABLET EACH DAY   dicyclomine 20 MG tablet Commonly known as:  BENTYL Take 1 tablet (20 mg total) by mouth 4 (four) times daily -  before meals and at bedtime.   furosemide 20 MG tablet Commonly known as:  LASIX Take 1 tablet (20 mg total) by mouth daily.   HYDROcodone-homatropine 5-1.5 MG/5ML syrup Commonly known as:  HYCODAN Take 5-10 mLs by mouth every 6 (six) hours as needed.   HYDROmorphone 4 MG tablet Commonly known as:  DILAUDID Take 1 tablet (4 mg total) by mouth every 4 (four) hours as needed for severe pain.   hydrOXYzine 25 MG capsule Commonly known as:  VISTARIL Take 1 capsule (25 mg total) by mouth every 8 (eight) hours as needed for anxiety.   ibuprofen 800 MG tablet Commonly known as:  ADVIL,MOTRIN Take 1 tablet (800 mg total) by mouth every 8 (eight) hours as needed.   ondansetron 4 MG tablet Commonly known as:  ZOFRAN Take 1 tablet (4 mg total) by mouth every 8 (eight) hours as needed.   phentermine 37.5 MG tablet Commonly known as:  ADIPEX-P Take 1 tablet (  37.5 mg total) by mouth daily before breakfast.   sulfamethoxazole-trimethoprim 800-160 MG tablet Commonly known as:  BACTRIM DS Take 1 tablet by mouth 2 (two) times daily.   SUMAtriptan 50 MG tablet Commonly known as:  IMITREX TAKE 1 TABLET AT START OF HEADACHE REPEAT IN 4 HOURS IF NEEDED   traZODone 50 MG tablet Commonly known as:  DESYREL Take 1-2 tablets (50-100 mg total) by mouth at bedtime as needed for sleep.   valACYclovir 1000 MG tablet Commonly known as:  VALTREX Take 1 tablet (1,000 mg total) by mouth 2 (two) times daily. Then one daily          Objective:    BP 126/81   Pulse 82   Temp (!) 97.3 F (36.3 C) (Oral)   Ht 5\' 7"  (1.702 m)   Wt 266 lb (120.7 kg)   LMP 02/24/2013   BMI  41.66 kg/m   Allergies  Allergen Reactions  . Codeine Itching and Rash  . Latex Itching and Rash  . Norco [Hydrocodone-Acetaminophen] Itching and Rash  . Tape Itching and Rash    Wt Readings from Last 3 Encounters:  06/22/18 266 lb (120.7 kg)  11/06/17 259 lb 6.4 oz (117.7 kg)  10/22/17 267 lb (121.1 kg)    Physical Exam Constitutional:      Appearance: She is well-developed.  HENT:     Head: Normocephalic and atraumatic.  Eyes:     Conjunctiva/sclera: Conjunctivae normal.     Pupils: Pupils are equal, round, and reactive to light.  Cardiovascular:     Rate and Rhythm: Normal rate and regular rhythm.     Heart sounds: Normal heart sounds.  Pulmonary:     Effort: Pulmonary effort is normal.     Breath sounds: Normal breath sounds.  Abdominal:     General: Bowel sounds are normal. There is no distension.     Palpations: Abdomen is soft. There is no mass.     Tenderness: There is abdominal tenderness in the suprapubic area. There is no guarding or rebound.  Skin:    General: Skin is warm and dry.     Findings: No rash.  Neurological:     Mental Status: She is alert and oriented to person, place, and time.     Deep Tendon Reflexes: Reflexes are normal and symmetric.  Psychiatric:        Behavior: Behavior normal.        Thought Content: Thought content normal.        Judgment: Judgment normal.     Results for orders placed or performed in visit on 06/22/18  Urine Culture  Result Value Ref Range   Urine Culture, Routine Final report    Organism ID, Bacteria No growth   Microscopic Examination  Result Value Ref Range   WBC, UA 0-5 0 - 5 /hpf   RBC, UA None seen 0 - 2 /hpf   Epithelial Cells (non renal) 0-10 0 - 10 /hpf   Renal Epithel, UA None seen None seen /hpf   Mucus, UA Present Not Estab.   Bacteria, UA Moderate (A) None seen/Few  Urinalysis, Complete  Result Value Ref Range   Specific Gravity, UA >1.030 (H) 1.005 - 1.030   pH, UA 6.0 5.0 - 7.5   Color,  UA Yellow Yellow   Appearance Ur Clear Clear   Leukocytes, UA Negative Negative   Protein, UA 1+ (A) Negative/Trace   Glucose, UA Negative Negative   Ketones, UA Negative Negative  RBC, UA Negative Negative   Bilirubin, UA Negative Negative   Urobilinogen, Ur 0.2 0.2 - 1.0 mg/dL   Nitrite, UA Negative Negative   Microscopic Examination See below:       Assessment & Plan:   1. Dysuria - Urine Culture - Urinalysis, Complete  2. Acute cystitis without hematuria - sulfamethoxazole-trimethoprim (BACTRIM DS) 800-160 MG tablet; Take 1 tablet by mouth 2 (two) times daily.  Dispense: 20 tablet; Refill: 0 - Microscopic Examination  3. GAD (generalized anxiety disorder) - hydrOXYzine (VISTARIL) 25 MG capsule; Take 1 capsule (25 mg total) by mouth every 8 (eight) hours as needed for anxiety.  Dispense: 60 capsule; Refill: 5   Continue all other maintenance medications as listed above.  Follow up plan: Return if symptoms worsen or fail to improve.  Educational handout given for [place] PA-C [facility] [address]  [place], [place] [address] [phone]   06/24/2018, 8:29 PM

## 2018-08-02 ENCOUNTER — Telehealth: Payer: Self-pay | Admitting: Physician Assistant

## 2018-08-02 ENCOUNTER — Other Ambulatory Visit: Payer: Self-pay | Admitting: Physician Assistant

## 2018-08-02 MED ORDER — AMOXICILLIN 500 MG PO CAPS: 500.0000 mg | ORAL_CAPSULE | Freq: Three times a day (TID) | ORAL | 0 refills | Status: DC

## 2018-08-02 NOTE — Telephone Encounter (Signed)
Sent amoxicillin.

## 2018-08-02 NOTE — Telephone Encounter (Signed)
Pt aware.

## 2018-11-05 ENCOUNTER — Other Ambulatory Visit: Payer: Self-pay | Admitting: Physician Assistant

## 2018-11-05 DIAGNOSIS — S32009K Unspecified fracture of unspecified lumbar vertebra, subsequent encounter for fracture with nonunion: Secondary | ICD-10-CM

## 2018-11-05 DIAGNOSIS — M5137 Other intervertebral disc degeneration, lumbosacral region: Secondary | ICD-10-CM

## 2018-11-05 DIAGNOSIS — G894 Chronic pain syndrome: Secondary | ICD-10-CM

## 2018-12-14 ENCOUNTER — Other Ambulatory Visit: Payer: Self-pay | Admitting: Physician Assistant

## 2018-12-14 DIAGNOSIS — F411 Generalized anxiety disorder: Secondary | ICD-10-CM

## 2018-12-15 NOTE — Telephone Encounter (Signed)
Needs to be seen

## 2019-03-21 ENCOUNTER — Other Ambulatory Visit: Payer: Self-pay | Admitting: Physician Assistant

## 2019-03-21 DIAGNOSIS — F411 Generalized anxiety disorder: Secondary | ICD-10-CM

## 2019-04-11 ENCOUNTER — Other Ambulatory Visit: Payer: Self-pay

## 2019-04-12 ENCOUNTER — Encounter: Payer: Self-pay | Admitting: Physician Assistant

## 2019-04-12 ENCOUNTER — Ambulatory Visit (INDEPENDENT_AMBULATORY_CARE_PROVIDER_SITE_OTHER): Payer: Self-pay | Admitting: Physician Assistant

## 2019-04-12 DIAGNOSIS — J0110 Acute frontal sinusitis, unspecified: Secondary | ICD-10-CM

## 2019-04-12 DIAGNOSIS — M5137 Other intervertebral disc degeneration, lumbosacral region: Secondary | ICD-10-CM

## 2019-04-12 DIAGNOSIS — F411 Generalized anxiety disorder: Secondary | ICD-10-CM

## 2019-04-12 DIAGNOSIS — G43809 Other migraine, not intractable, without status migrainosus: Secondary | ICD-10-CM

## 2019-04-12 MED ORDER — AMOXICILLIN 500 MG PO CAPS: 500.0000 mg | ORAL_CAPSULE | Freq: Three times a day (TID) | ORAL | 0 refills | Status: DC

## 2019-04-12 MED ORDER — SUMATRIPTAN SUCCINATE 50 MG PO TABS: ORAL_TABLET | ORAL | 5 refills | Status: DC

## 2019-04-12 MED ORDER — CITALOPRAM HYDROBROMIDE 40 MG PO TABS: 40.0000 mg | ORAL_TABLET | Freq: Every day | ORAL | 11 refills | Status: DC

## 2019-04-14 ENCOUNTER — Encounter: Payer: Self-pay | Admitting: Physician Assistant

## 2019-04-14 NOTE — Progress Notes (Signed)
Telephone visit  Subjective: [id number] pain and DDD PCP: Terald Sleeper, PA-C [id number] D Pate is a 43 y.o. female calls for telephone consult today. Patient provides verbal consent for consult held via phone.  Patient is identified with 2 separate identifiers.  At this time the entire area is on COVID-19 social distancing and stay home orders are in place.  Patient is of higher risk and therefore we are performing this by a virtual method.  Location of patient: home Location of provider: [facility] Others present for call: no  This patient is have a follow-up for her chronic medical conditions which do include degenerative disc disease throughout her spine with a history of surgery and failed surgeries.  She does have permanent disability and not able to work a physical job anymore.  She avoids narcotic pain medication.  She does take anti-inflammatories.  She does not tolerate muscle relaxants very well but will use them if she is hurting very badly.  She does try to walk and do some movement daily.  There was a significant staph infection in her wound that caused her to have sepsis back during the time of the surgery.  He does have continued depression with anxiety.  Most of her anxiety is also related to her health problems and difficulty with finances.  She states that overall medications that she has been taking has been beneficial.  She also does occasionally have migraines.  ROS: Per HPI  Allergies  Allergen Reactions  . Codeine Itching and Rash  . Latex Itching and Rash  . Norco [Hydrocodone-Acetaminophen] Itching and Rash  . Tape Itching and Rash   Past Medical History:  Diagnosis Date  . Anxiety   . DDD (degenerative disc disease)   . Diabetes mellitus without complication ([place])    borderline; diet controlled  . Family history of adverse reaction to anesthesia    Patients son has bad N/V  . Migraine   . PONV (postoperative nausea and vomiting)     Current  Outpatient Medications:  .  acetaminophen (TYLENOL) 325 MG tablet, Take 650 mg by mouth every 6 (six) hours as needed for mild pain., Disp: , Rfl:  .  amoxicillin (AMOXIL) 500 MG capsule, Take 1 capsule (500 mg total) by mouth 3 (three) times daily., Disp: 30 capsule, Rfl: 0 .  citalopram (CELEXA) 40 MG tablet, Take 1 tablet (40 mg total) by mouth daily. (Needs to be seen before next refill), Disp: 30 tablet, Rfl: 11 .  HYDROcodone-homatropine (HYCODAN) 5-1.5 MG/5ML syrup, Take 5-10 mLs by mouth every 6 (six) hours as needed., Disp: 240 mL, Rfl: 0 .  hydrOXYzine (VISTARIL) 25 MG capsule, Take 1 capsule (25 mg total) by mouth every 8 (eight) hours as needed for anxiety., Disp: 60 capsule, Rfl: 5 .  ibuprofen (ADVIL) 800 MG tablet, TAKE ONE TABLET EVERY 8 HOURS AS NEEDED, Disp: 90 tablet, Rfl: 11 .  SUMAtriptan (IMITREX) 50 MG tablet, TAKE 1 TABLET AT START OF HEADACHE REPEAT IN 4 HOURS IF NEEDED, Disp: 9 tablet, Rfl: 5  Assessment/ Plan: 43 y.o. female   1. DDD (degenerative disc disease), lumbosacral  2. GAD (generalized anxiety disorder) - citalopram (CELEXA) 40 MG tablet; Take 1 tablet (40 mg total) by mouth daily. (Needs to be seen before next refill)  Dispense: 30 tablet; Refill: 11  3. Acute non-recurrent frontal sinusitis - amoxicillin (AMOXIL) 500 MG capsule; Take 1 capsule (500 mg total) by mouth 3 (three) times daily.  Dispense: 30  capsule; Refill: 0  4. Other migraine without status migrainosus, not intractable - SUMAtriptan (IMITREX) 50 MG tablet; TAKE 1 TABLET AT START OF HEADACHE REPEAT IN 4 HOURS IF NEEDED  Dispense: 9 tablet; Refill: 5    No follow-ups on file.  Continue all other maintenance medications as listed above.  Start time: 1:55 PM End time: 2:05 PM  Meds ordered this encounter  Medications  . citalopram (CELEXA) 40 MG tablet    Sig: Take 1 tablet (40 mg total) by mouth daily. (Needs to be seen before next refill)    Dispense:  30 tablet    Refill:  11     Order Specific Question:   Supervising Provider    Answer:   Janora Norlander [id number]  . SUMAtriptan (IMITREX) 50 MG tablet    Sig: TAKE 1 TABLET AT START OF HEADACHE REPEAT IN 4 HOURS IF NEEDED    Dispense:  9 tablet    Refill:  5    Order Specific Question:   Supervising Provider    Answer:   Janora Norlander [id number]  . amoxicillin (AMOXIL) 500 MG capsule    Sig: Take 1 capsule (500 mg total) by mouth 3 (three) times daily.    Dispense:  30 capsule    Refill:  0    Order Specific Question:   Supervising Provider    Answer:   Janora Norlander [id number]    [name] PA-C [place] [phone]

## 2019-07-25 ENCOUNTER — Telehealth: Payer: Self-pay | Admitting: Physician Assistant

## 2019-07-25 NOTE — Telephone Encounter (Signed)
Patient had episode at church yesterday.  Felt like she was going to pass out, blood pressure was elevated.  Appointment scheduled with [name] on 07/26/2019 at 10:40 am.

## 2019-07-26 ENCOUNTER — Ambulatory Visit (INDEPENDENT_AMBULATORY_CARE_PROVIDER_SITE_OTHER): Payer: Self-pay | Admitting: Physician Assistant

## 2019-07-26 ENCOUNTER — Other Ambulatory Visit: Payer: Self-pay

## 2019-07-26 ENCOUNTER — Encounter: Payer: Self-pay | Admitting: Physician Assistant

## 2019-07-26 VITALS — BP 131/91 | HR 87 | Temp 97.5°F | Ht 67.0 in | Wt 293.2 lb

## 2019-07-26 DIAGNOSIS — E1159 Type 2 diabetes mellitus with other circulatory complications: Secondary | ICD-10-CM | POA: Insufficient documentation

## 2019-07-26 DIAGNOSIS — R079 Chest pain, unspecified: Secondary | ICD-10-CM

## 2019-07-26 DIAGNOSIS — I10 Essential (primary) hypertension: Secondary | ICD-10-CM

## 2019-07-26 DIAGNOSIS — R002 Palpitations: Secondary | ICD-10-CM | POA: Insufficient documentation

## 2019-07-26 DIAGNOSIS — F411 Generalized anxiety disorder: Secondary | ICD-10-CM

## 2019-07-26 HISTORY — DX: Palpitations: R00.2

## 2019-07-26 HISTORY — DX: Essential (primary) hypertension: I10

## 2019-07-26 MED ORDER — HYDROXYZINE PAMOATE 25 MG PO CAPS: 25.0000 mg | ORAL_CAPSULE | Freq: Three times a day (TID) | ORAL | 5 refills | Status: DC | PRN

## 2019-07-26 MED ORDER — LISINOPRIL 5 MG PO TABS: 5.0000 mg | ORAL_TABLET | Freq: Every day | ORAL | 0 refills | Status: DC

## 2019-07-26 NOTE — Progress Notes (Signed)
BP (!) 131/91   Pulse 87   Temp (!) 97.5 F (36.4 C) (Temporal)   Ht 5' 7"  (1.702 m)   Wt 293 lb 3.2 oz (133 kg)   LMP 02/24/2013   SpO2 94%   BMI 45.92 kg/m    Subjective:    Patient ID: Cheryl Randall, female    DOB: Mar 25, 1976, 44 y.o.   MRN: [id number]  HPI 1. Chest pain, unspecified type  2. Palpitations  3. Essential hypertension  4. GAD (generalized anxiety disorder)   HPI: Cheryl Randall is a 44 y.o. female presenting on 07/26/2019 for Hypertension, Shortness of Breath, and Anxiety  2 days ago the patient had an episode while she was at church and had been singing.  Afterwards she felt hot and weak at the same time.  She had some palpitations and felt that she was going to pass out.  She did sit down.  She experienced no shortness of breath.  And mild chest pain.  She does have strong family history of cardiac disease.  EMS was called and the blood pressure readings were 140-150s over 90s.  They had checked her blood pressure with the AED monitor at her church but it was quite elevated on that.  Encouraged them to get it checked again because does not use very often.  Here in the office a day her blood pressure is 137/93.  Pulse of 87.  She states that for quite some time she has had palpitations and is just concerned that there is something going on with her heart.   Past Medical History:  Diagnosis Date  . Anxiety   . DDD (degenerative disc disease)   . Diabetes mellitus without complication ([place])    borderline; diet controlled  . Family history of adverse reaction to anesthesia    Patients son has bad N/V  . Migraine   . PONV (postoperative nausea and vomiting)    Relevant past medical, surgical, family and social history reviewed and updated as indicated. Interim medical history since our last visit reviewed. Allergies and medications reviewed and updated. DATA REVIEWED: CHART IN EPIC  Family History reviewed for pertinent findings.  Review of Systems   Constitutional: Negative.  Negative for activity change, fatigue and fever.  HENT: Negative.   Eyes: Negative.   Respiratory: Negative.  Negative for cough, shortness of breath and wheezing.   Cardiovascular: Positive for chest pain and palpitations.  Gastrointestinal: Negative.  Negative for abdominal pain.  Endocrine: Negative.   Genitourinary: Negative.  Negative for dysuria.  Musculoskeletal: Negative.   Skin: Negative.   Neurological: Positive for weakness.    Allergies as of 07/26/2019      Reactions   Codeine Itching, Rash   Latex Itching, Rash   Norco [hydrocodone-acetaminophen] Itching, Rash   Tape Itching, Rash      Medication List       Accurate as of July 26, 2019  1:42 PM. If you have any questions, ask your nurse or doctor.        STOP taking these medications   amoxicillin 500 MG capsule Commonly known as: AMOXIL Stopped by: Terald Sleeper, PA-C   HYDROcodone-homatropine 5-1.5 MG/5ML syrup Commonly known as: HYCODAN Stopped by: Terald Sleeper, PA-C     TAKE these medications   acetaminophen 325 MG tablet Commonly known as: TYLENOL Take 650 mg by mouth every 6 (six) hours as needed for mild pain.   citalopram 40 MG tablet Commonly known as: CELEXA Take  1 tablet (40 mg total) by mouth daily. (Needs to be seen before next refill)   hydrOXYzine 25 MG capsule Commonly known as: Vistaril Take 1 capsule (25 mg total) by mouth every 8 (eight) hours as needed for anxiety.   ibuprofen 800 MG tablet Commonly known as: ADVIL TAKE ONE TABLET EVERY 8 HOURS AS NEEDED   lisinopril 5 MG tablet Commonly known as: ZESTRIL Take 1 tablet (5 mg total) by mouth daily. Started by: Terald Sleeper, PA-C   SUMAtriptan 50 MG tablet Commonly known as: IMITREX TAKE 1 TABLET AT START OF HEADACHE REPEAT IN 4 HOURS IF NEEDED          Objective:    BP (!) 131/91   Pulse 87   Temp (!) 97.5 F (36.4 C) (Temporal)   Ht 5' 7"  (1.702 m)   Wt 293 lb 3.2 oz (133 kg)    LMP 02/24/2013   SpO2 94%   BMI 45.92 kg/m   Allergies  Allergen Reactions  . Codeine Itching and Rash  . Latex Itching and Rash  . Norco [Hydrocodone-Acetaminophen] Itching and Rash  . Tape Itching and Rash    Wt Readings from Last 3 Encounters:  07/26/19 293 lb 3.2 oz (133 kg)  06/22/18 266 lb (120.7 kg)  11/06/17 259 lb 6.4 oz (117.7 kg)    Physical Exam Constitutional:      Appearance: She is well-developed.  HENT:     Head: Normocephalic and atraumatic.     Right Ear: Tympanic membrane, ear canal and external ear normal.     Left Ear: Tympanic membrane, ear canal and external ear normal.     Nose: Nose normal. No rhinorrhea.     Mouth/Throat:     Pharynx: No oropharyngeal exudate or posterior oropharyngeal erythema.  Eyes:     Conjunctiva/sclera: Conjunctivae normal.     Pupils: Pupils are equal, round, and reactive to light.  Cardiovascular:     Rate and Rhythm: Normal rate and regular rhythm.     Heart sounds: Normal heart sounds.  Pulmonary:     Effort: Pulmonary effort is normal.     Breath sounds: Normal breath sounds.  Abdominal:     General: Bowel sounds are normal.     Palpations: Abdomen is soft.  Musculoskeletal:     Cervical back: Normal range of motion and neck supple.  Skin:    General: Skin is warm and dry.     Findings: No rash.  Neurological:     Mental Status: She is alert and oriented to person, place, and time.     Deep Tendon Reflexes: Reflexes are normal and symmetric.  Psychiatric:        Behavior: Behavior normal.        Thought Content: Thought content normal.        Judgment: Judgment normal.         Assessment & Plan:   1. Chest pain, unspecified type - EKG 12-Lead - CBC with Differential/Platelet - CMP14+EGFR - Lipid panel - TSH - Ambulatory referral to Cardiology  2. Palpitations - TSH - Ambulatory referral to Cardiology  3. Essential hypertension - lisinopril (ZESTRIL) 5 MG tablet; Take 1 tablet (5 mg total) by  mouth daily.  Dispense: 90 tablet; Refill: 0 - CBC with Differential/Platelet - CMP14+EGFR - Lipid panel - TSH - Ambulatory referral to Cardiology  4. GAD (generalized anxiety disorder) - hydrOXYzine (VISTARIL) 25 MG capsule; Take 1 capsule (25 mg total) by mouth every 8 (eight)  hours as needed for anxiety.  Dispense: 60 capsule; Refill: 5   Continue all other maintenance medications as listed above.  Follow up plan: Return in about 4 weeks (around 08/23/2019).  Educational handout given for palpitations  Terald Sleeper PA-C [facility] [address]  [place], [place] [address] [phone]   07/26/2019, 1:42 PM

## 2019-07-26 NOTE — Patient Instructions (Signed)
DASH Eating Plan DASH stands for "Dietary Approaches to Stop Hypertension." The DASH eating plan is a healthy eating plan that has been shown to reduce high blood pressure (hypertension). It may also reduce your risk for type 2 diabetes, heart disease, and stroke. The DASH eating plan may also help with weight loss. What are tips for following this plan?  General guidelines  Avoid eating more than 2,300 mg (milligrams) of salt (sodium) a day. If you have hypertension, you may need to reduce your sodium intake to 1,500 mg a day.  Limit alcohol intake to no more than 1 drink a day for nonpregnant women and 2 drinks a day for men. One drink equals 12 oz of beer, 5 oz of wine, or 1 oz of hard liquor.  Work with your health care provider to maintain a healthy body weight or to lose weight. Ask what an ideal weight is for you.  Get at least 30 minutes of exercise that causes your heart to beat faster (aerobic exercise) most days of the week. Activities may include walking, swimming, or biking.  Work with your health care provider or diet and nutrition specialist (dietitian) to adjust your eating plan to your individual calorie needs. Reading food labels   Check food labels for the amount of sodium per serving. Choose foods with less than 5 percent of the Daily Value of sodium. Generally, foods with less than 300 mg of sodium per serving fit into this eating plan.  To find whole grains, look for the word "whole" as the first word in the ingredient list. Shopping  Buy products labeled as "low-sodium" or "no salt added."  Buy fresh foods. Avoid canned foods and premade or frozen meals. Cooking  Avoid adding salt when cooking. Use salt-free seasonings or herbs instead of table salt or sea salt. Check with your health care provider or pharmacist before using salt substitutes.  Do not fry foods. Cook foods using healthy methods such as baking, boiling, grilling, and broiling instead.  Cook with  heart-healthy oils, such as olive, canola, soybean, or sunflower oil. Meal planning  Eat a balanced diet that includes: ? 5 or more servings of fruits and vegetables each day. At each meal, try to fill half of your plate with fruits and vegetables. ? Up to 6-8 servings of whole grains each day. ? Less than 6 oz of lean meat, poultry, or fish each day. A 3-oz serving of meat is about the same size as a deck of cards. One egg equals 1 oz. ? 2 servings of low-fat dairy each day. ? A serving of nuts, seeds, or beans 5 times each week. ? Heart-healthy fats. Healthy fats called Omega-3 fatty acids are found in foods such as flaxseeds and coldwater fish, like sardines, salmon, and mackerel.  Limit how much you eat of the following: ? Canned or prepackaged foods. ? Food that is high in trans fat, such as fried foods. ? Food that is high in saturated fat, such as fatty meat. ? Sweets, desserts, sugary drinks, and other foods with added sugar. ? Full-fat dairy products.  Do not salt foods before eating.  Try to eat at least 2 vegetarian meals each week.  Eat more home-cooked food and less restaurant, buffet, and fast food.  When eating at a restaurant, ask that your food be prepared with less salt or no salt, if possible. What foods are recommended? The items listed may not be a complete list. Talk with your dietitian about   what dietary choices are best for you. Grains Whole-grain or whole-wheat bread. Whole-grain or whole-wheat pasta. Brown rice. Oatmeal. Quinoa. Bulgur. Whole-grain and low-sodium cereals. Pita bread. Low-fat, low-sodium crackers. Whole-wheat flour tortillas. Vegetables Fresh or frozen vegetables (raw, steamed, roasted, or grilled). Low-sodium or reduced-sodium tomato and vegetable juice. Low-sodium or reduced-sodium tomato sauce and tomato paste. Low-sodium or reduced-sodium canned vegetables. Fruits All fresh, dried, or frozen fruit. Canned fruit in natural juice (without  added sugar). Meat and other protein foods Skinless chicken or turkey. Ground chicken or turkey. Pork with fat trimmed off. Fish and seafood. Egg whites. Dried beans, peas, or lentils. Unsalted nuts, nut butters, and seeds. Unsalted canned beans. Lean cuts of beef with fat trimmed off. Low-sodium, lean deli meat. Dairy Low-fat (1%) or fat-free (skim) milk. Fat-free, low-fat, or reduced-fat cheeses. Nonfat, low-sodium ricotta or cottage cheese. Low-fat or nonfat yogurt. Low-fat, low-sodium cheese. Fats and oils Soft margarine without trans fats. Vegetable oil. Low-fat, reduced-fat, or light mayonnaise and salad dressings (reduced-sodium). Canola, safflower, olive, soybean, and sunflower oils. Avocado. Seasoning and other foods Herbs. Spices. Seasoning mixes without salt. Unsalted popcorn and pretzels. Fat-free sweets. What foods are not recommended? The items listed may not be a complete list. Talk with your dietitian about what dietary choices are best for you. Grains Baked goods made with fat, such as croissants, muffins, or some breads. Dry pasta or rice meal packs. Vegetables Creamed or fried vegetables. Vegetables in a cheese sauce. Regular canned vegetables (not low-sodium or reduced-sodium). Regular canned tomato sauce and paste (not low-sodium or reduced-sodium). Regular tomato and vegetable juice (not low-sodium or reduced-sodium). Pickles. Olives. Fruits Canned fruit in a light or heavy syrup. Fried fruit. Fruit in cream or butter sauce. Meat and other protein foods Fatty cuts of meat. Ribs. Fried meat. Bacon. Sausage. Bologna and other processed lunch meats. Salami. Fatback. Hotdogs. Bratwurst. Salted nuts and seeds. Canned beans with added salt. Canned or smoked fish. Whole eggs or egg yolks. Chicken or turkey with skin. Dairy Whole or 2% milk, cream, and half-and-half. Whole or full-fat cream cheese. Whole-fat or sweetened yogurt. Full-fat cheese. Nondairy creamers. Whipped toppings.  Processed cheese and cheese spreads. Fats and oils Butter. Stick margarine. Lard. Shortening. Ghee. Bacon fat. Tropical oils, such as coconut, palm kernel, or palm oil. Seasoning and other foods Salted popcorn and pretzels. Onion salt, garlic salt, seasoned salt, table salt, and sea salt. Worcestershire sauce. Tartar sauce. Barbecue sauce. Teriyaki sauce. Soy sauce, including reduced-sodium. Steak sauce. Canned and packaged gravies. Fish sauce. Oyster sauce. Cocktail sauce. Horseradish that you find on the shelf. Ketchup. Mustard. Meat flavorings and tenderizers. Bouillon cubes. Hot sauce and Tabasco sauce. Premade or packaged marinades. Premade or packaged taco seasonings. Relishes. Regular salad dressings. Where to find more information:  National Heart, Lung, and Blood Institute: www.nhlbi.nih.gov  American Heart Association: www.heart.org Summary  The DASH eating plan is a healthy eating plan that has been shown to reduce high blood pressure (hypertension). It may also reduce your risk for type 2 diabetes, heart disease, and stroke.  With the DASH eating plan, you should limit salt (sodium) intake to 2,300 mg a day. If you have hypertension, you may need to reduce your sodium intake to 1,500 mg a day.  When on the DASH eating plan, aim to eat more fresh fruits and vegetables, whole grains, lean proteins, low-fat dairy, and heart-healthy fats.  Work with your health care provider or diet and nutrition specialist (dietitian) to adjust your eating plan to your   individual calorie needs. This information is not intended to replace advice given to you by your health care provider. Make sure you discuss any questions you have with your health care provider. Document Revised: 05/01/2017 Document Reviewed: 05/12/2016 Elsevier Patient Education  2020 Elsevier Inc.  

## 2019-07-27 LAB — CBC WITH DIFFERENTIAL/PLATELET
Basophils Absolute: 0.1 10*3/uL (ref 0.0–0.2)
Basos: 1 %
EOS (ABSOLUTE): 0.1 10*3/uL (ref 0.0–0.4)
Eos: 1 %
Hematocrit: 40.4 % (ref 34.0–46.6)
Hemoglobin: 13.2 g/dL (ref 11.1–15.9)
Immature Grans (Abs): 0 10*3/uL (ref 0.0–0.1)
Immature Granulocytes: 0 %
Lymphocytes Absolute: 1.1 10*3/uL (ref 0.7–3.1)
Lymphs: 18 %
MCH: 28 pg (ref 26.6–33.0)
MCHC: 32.7 g/dL (ref 31.5–35.7)
MCV: 86 fL (ref 79–97)
Monocytes Absolute: 0.6 10*3/uL (ref 0.1–0.9)
Monocytes: 9 %
Neutrophils Absolute: 4.1 10*3/uL (ref 1.4–7.0)
Neutrophils: 71 %
Platelets: 220 10*3/uL (ref 150–450)
RBC: 4.71 x10E6/uL (ref 3.77–5.28)
RDW: 12.7 % (ref 11.7–15.4)
WBC: 5.9 10*3/uL (ref 3.4–10.8)

## 2019-07-27 LAB — CMP14+EGFR
ALT: 10 IU/L (ref 0–32)
AST: 15 IU/L (ref 0–40)
Albumin/Globulin Ratio: 2.2 (ref 1.2–2.2)
Albumin: 4.4 g/dL (ref 3.8–4.8)
Alkaline Phosphatase: 62 IU/L (ref 39–117)
BUN/Creatinine Ratio: 14 (ref 9–23)
BUN: 10 mg/dL (ref 6–24)
Bilirubin Total: 0.5 mg/dL (ref 0.0–1.2)
CO2: 23 mmol/L (ref 20–29)
Calcium: 9.4 mg/dL (ref 8.7–10.2)
Chloride: 103 mmol/L (ref 96–106)
Creatinine, Ser: 0.71 mg/dL (ref 0.57–1.00)
GFR calc Af Amer: 120 mL/min/{1.73_m2} (ref >59)
GFR calc non Af Amer: 104 mL/min/{1.73_m2} (ref >59)
Globulin, Total: 2 g/dL (ref 1.5–4.5)
Glucose: 96 mg/dL (ref 65–99)
Potassium: 4.4 mmol/L (ref 3.5–5.2)
Sodium: 139 mmol/L (ref 134–144)
Total Protein: 6.4 g/dL (ref 6.0–8.5)

## 2019-07-27 LAB — LIPID PANEL
Chol/HDL Ratio: 2.8 ratio (ref 0.0–4.4)
Cholesterol, Total: 165 mg/dL (ref 100–199)
HDL: 59 mg/dL (ref >39)
LDL Chol Calc (NIH): 90 mg/dL (ref 0–99)
Triglycerides: 87 mg/dL (ref 0–149)
VLDL Cholesterol Cal: 16 mg/dL (ref 5–40)

## 2019-07-27 LAB — TSH: TSH: 3.47 u[IU]/mL (ref 0.450–4.500)

## 2019-08-11 NOTE — Progress Notes (Signed)
CARDIOLOGY CONSULT NOTE       Patient ID: Cheryl Randall MRN: [id number] DOB/AGE: 1976-03-28 44 y.o.  Admit date: (Not on file) Referring Physician: Primary below Primary Physician: Terald Sleeper, PA-C Primary Cardiologist: New Reason for Consultation: Chest Pain  Active Problems:   * No active hospital problems. *   HPI:  44 y.o. referred by Particia Randall [facility] PA-C for chest pain , palpitations and HTN. She has also had dyspnea and anxiety. Reviewed primary note from 07/26/19 By description ? Vagal reaction singing at church Hot/weak felt like she was going to pass out then had some palpitations EMS noted good vital signs She indicates having palpitations for " quite some time " Concerned something is wrong with her heart. 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 Anxiety   . Chronic pain syndrome 11/25/2016  . DDD (degenerative disc disease)   . 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 Deep incisional surgical site infection 07/01/2015  . Diabetes mellitus without complication ([facility])    borderline; diet controlled  . Essential hypertension 07/26/2019  . Family history of adverse reaction to anesthesia    Patients son has bad N/V  . GAD (generalized anxiety disorder) 06/26/2017  . Generalized edema 03/24/2017  . 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[facility]) 08/21/2017  . 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  . Migraine   . Palpitations 07/26/2019  . Panic attack 06/26/2017  . PONV (postoperative nausea and vomiting)   . Pseudoarthrosis of lumbar spine  05/31/2015  . Staphylococcus aureus bacteremia with sepsis ([place])   . Wound infection 06/30/2015    Family History  Problem Relation Age of Onset  . Cancer Father        bladder    Social History   Socioeconomic History  . Marital status: Legally Separated    Spouse name: Not on file  . Number of children: Not on file  . Years of education: Not on file  . Highest education level: Not on file  Occupational History  . Not on file  Tobacco Use  . Smoking status: Former Research scientist (life sciences)  . Smokeless tobacco: Never Used  Substance and Sexual Activity  . Alcohol use: No  . Drug use: No  . Sexual activity: Not on file  Other Topics Concern  . Not on file  Social History Narrative  . Not on file   Social Determinants of Health   Financial Resource Strain:   . Difficulty of Paying Living Expenses:   Food Insecurity:   . Worried About Charity fundraiser in the Last Year:   . Arboriculturist in the Last Year:   Transportation Needs:   . Film/video editor (Medical):   Marland Kitchen Lack of Transportation (Non-Medical):   Physical Activity:   . Days of Exercise per Week:   . Minutes of Exercise per Session:   Stress:   . Feeling of Stress :   Social Connections:   . Frequency of Communication with Friends and Family:   . Frequency  of Social Gatherings with Friends and Family:   . Attends Religious Services:   . Active Member of Clubs or Organizations:   . Attends Archivist Meetings:   Marland Kitchen Marital Status:   Intimate Partner Violence:   . Fear of Current or Ex-Partner:   . Emotionally Abused:   Marland Kitchen Physically Abused:   . Sexually Abused:     Past Surgical History:  Procedure Laterality Date  . ABDOMINAL HYSTERECTOMY    . BACK SURGERY     X 2  . BREAST SURGERY Bilateral    reduction  . CHOLECYSTECTOMY    . LUMBAR LAMINECTOMY/DECOMPRESSION MICRODISCECTOMY N/A 06/30/2015   Procedure: wound exploration irrigation and debridement ,explantation of bone growth stimulator, placement  of wound vac;  Surgeon: Kevan Ny Ditty, MD;  Location: [facility];  Service: Neurosurgery;  Laterality: N/A;      Current Outpatient Medications:  .  acetaminophen (TYLENOL) 325 MG tablet, Take 650 mg by mouth every 6 (six) hours as needed for mild pain., Disp: , Rfl:  .  citalopram (CELEXA) 40 MG tablet, Take 1 tablet (40 mg total) by mouth daily. (Needs to be seen before next refill), Disp: 30 tablet, Rfl: 11 .  hydrOXYzine (VISTARIL) 25 MG capsule, Take 1 capsule (25 mg total) by mouth every 8 (eight) hours as needed for anxiety., Disp: 60 capsule, Rfl: 5 .  ibuprofen (ADVIL) 800 MG tablet, TAKE ONE TABLET EVERY 8 HOURS AS NEEDED, Disp: 90 tablet, Rfl: 11 .  lisinopril (ZESTRIL) 5 MG tablet, Take 1 tablet (5 mg total) by mouth daily., Disp: 90 tablet, Rfl: 0 .  SUMAtriptan (IMITREX) 50 MG tablet, TAKE 1 TABLET AT START OF HEADACHE REPEAT IN 4 HOURS IF NEEDED, Disp: 9 tablet, Rfl: 5    Physical Exam: Blood pressure 100/72, pulse 83, height 5\' 7"  (1.702 m), weight 283 lb (128.4 kg), last menstrual period 02/24/2013, SpO2 98 %.    Affect appropriate Healthy:  appears stated age 69: normal Neck supple with no adenopathy JVP normal no bruits no thyromegaly Lungs clear with no wheezing and good diaphragmatic motion Heart:  S1/S2 no murmur, no rub, gallop or click PMI normal Abdomen: benighn, BS positve, no tenderness, no AAA no bruit.  No HSM or HJR Distal pulses intact with no bruits No edema Neuro non-focal Skin warm and dry No muscular weakness   Labs:   Lab Results  Component Value Date   WBC 5.9 07/26/2019   HGB 13.2 07/26/2019   HCT 40.4 07/26/2019   MCV 86 07/26/2019   PLT 220 07/26/2019   No results for input(s): NA, K, CL, CO2, BUN, CREATININE, CALCIUM, PROT, BILITOT, ALKPHOS, ALT, AST, GLUCOSE in the last 168 hours.  Invalid input(s): LABALBU Lab Results  Component Value Date   TROPONINI <0.03 01/13/2016    Lab Results  Component Value Date   CHOL  165 07/26/2019   CHOL 121 11/06/2017   Lab Results  Component Value Date   HDL 59 07/26/2019   HDL 48 11/06/2017   Lab Results  Component Value Date   LDLCALC 90 07/26/2019   LDLCALC 62 11/06/2017   Lab Results  Component Value Date   TRIG 87 07/26/2019   TRIG 53 11/06/2017   Lab Results  Component Value Date   CHOLHDL 2.8 07/26/2019   CHOLHDL 2.5 11/06/2017   No results found for: LDLDIRECT    Radiology: No results found.  EKG: see HPI   ASSESSMENT AND PLAN:   1. Chest pain;  atypical normal ECG CRF;s family history and HTN f/u ETT 2.  HTN:  Continue ACE f/u with primary may need lower dose told her to take in am  3.  Anxiety: continue Celexa f/u primary  4.  Palpitations :  Benign sounding will do monitor and eco  5. Pre syncope: likely vagal see above no high risk family history , exam or ECG   Signed: Jenkins Rouge 08/25/2019, 10:08 AM

## 2019-08-25 ENCOUNTER — Ambulatory Visit: Payer: Self-pay | Admitting: Physician Assistant

## 2019-08-25 ENCOUNTER — Other Ambulatory Visit: Payer: Self-pay

## 2019-08-25 ENCOUNTER — Telehealth: Payer: Self-pay | Admitting: Radiology

## 2019-08-25 ENCOUNTER — Ambulatory Visit (INDEPENDENT_AMBULATORY_CARE_PROVIDER_SITE_OTHER): Payer: Self-pay | Admitting: Cardiovascular Disease

## 2019-08-25 ENCOUNTER — Encounter: Payer: Self-pay | Admitting: Cardiovascular Disease

## 2019-08-25 VITALS — BP 100/72 | HR 83 | Ht 67.0 in | Wt 283.0 lb

## 2019-08-25 DIAGNOSIS — R002 Palpitations: Secondary | ICD-10-CM

## 2019-08-25 DIAGNOSIS — R079 Chest pain, unspecified: Secondary | ICD-10-CM

## 2019-08-25 NOTE — Telephone Encounter (Signed)
Enrolled patient for a 14 day Zio monitor to be mailed to patients [address]

## 2019-08-25 NOTE — Patient Instructions (Signed)
Medication Instructions:  *If you need a refill on your cardiac medications before your next appointment, please call your pharmacy*  Lab Work: If you have labs (blood work) drawn today and your tests are completely normal, you will receive your results only by: Marland Kitchen MyChart Message (if you have MyChart) OR . A paper copy in the mail If you have any lab test that is abnormal or we need to change your treatment, we will call you to review the results.  Testing/Procedures: Your physician has requested that you have an echocardiogram at [facility]. Echocardiography is a painless test that uses sound waves to create images of your heart. It provides your doctor with information about the size and shape of your heart and how well your heart's chambers and valves are working. This procedure takes approximately one hour. There are no restrictions for this procedure.  Your physician has requested that you have an exercise tolerance test at [facility]. For further information please visit [email or web address]. Please also follow instruction sheet, as given.  Your physician has recommended that you wear an 14 day zio patch monitor. Zio patch monitors are medical devices that record the heart's electrical activity. Doctors most often [name] these monitors to diagnose arrhythmias. Arrhythmias are problems with the speed or rhythm of the heartbeat. The monitor is a small, portable device. You can wear one while you do your normal daily activities. This is usually used to diagnose what is causing palpitations/syncope (passing out).  Follow-Up: At [facility], you and your health needs are our priority.  As part of our continuing mission to provide you with exceptional heart care, we have created designated Provider Care Teams.  These Care Teams include your primary Cardiologist (physician) and Advanced Practice Providers (APPs -  Physician Assistants and Nurse Practitioners) who all work together to  provide you with the care you need, when you need it.  We recommend signing up for the patient portal called "MyChart".  Sign up information is provided on this After Visit Summary.  MyChart is used to connect with patients for Virtual Visits (Telemedicine).  Patients are able to view lab/test results, encounter notes, upcoming appointments, etc.  Non-urgent messages can be sent to your provider as well.   To learn more about what you can do with MyChart, go to [email or web address].    Your next appointment:   [facility]   The format for your next appointment:   In Person  Provider:   You may see Dr. Johnsie Cancel or one of the following Advanced Practice Providers on your designated Care Team:    Truitt Merle, NP  Cecilie Kicks, NP  Kathyrn Drown, NP

## 2019-08-30 ENCOUNTER — Other Ambulatory Visit (INDEPENDENT_AMBULATORY_CARE_PROVIDER_SITE_OTHER): Payer: Self-pay

## 2019-08-30 DIAGNOSIS — R079 Chest pain, unspecified: Secondary | ICD-10-CM

## 2019-08-30 DIAGNOSIS — R002 Palpitations: Secondary | ICD-10-CM

## 2019-09-22 ENCOUNTER — Telehealth: Payer: Self-pay

## 2019-09-22 MED ORDER — METOPROLOL SUCCINATE ER 25 MG PO TB24: 25.0000 mg | ORAL_TABLET | Freq: Every day | ORAL | 3 refills | Status: DC

## 2019-09-22 NOTE — Telephone Encounter (Signed)
-----   Message from Josue Hector, MD sent at 09/22/2019  2:14 PM EDT ----- Some PACls and short bursts of SVT stop lisinopril and start Toprol 25 mg daily She is still supposed to have echo and ETT Can f/u with me or PA after these tests are done

## 2019-09-22 NOTE — Telephone Encounter (Signed)
Called patient with results of monitor. Per Dr. Johnsie Cancel, Some PACls and short bursts of SVT stop lisinopril and start Toprol 25 mg daily She is still supposed to have echo and ETT Can f/u with me or PA after these tests are done. Patient verbalized understanding. Made patient next available appointment in June with Dr. Johnsie Cancel in [place]. Undated patient's medication list and sent Toprol 25 mg daily to patient's pharmacy of choice.

## 2019-09-23 ENCOUNTER — Other Ambulatory Visit (HOSPITAL_COMMUNITY)
Admission: RE | Admit: 2019-09-23 | Discharge: 2019-09-23 | Disposition: A | Discharge status: Home / Self Care | Payer: HRSA Program | Source: Ambulatory Visit | Attending: Cardiovascular Disease | Admitting: Cardiovascular Disease

## 2019-09-23 ENCOUNTER — Other Ambulatory Visit: Payer: Self-pay

## 2019-09-23 DIAGNOSIS — Z01812 Encounter for preprocedural laboratory examination: Secondary | ICD-10-CM | POA: Diagnosis present

## 2019-09-23 DIAGNOSIS — Z20822 Contact with and (suspected) exposure to covid-19: Secondary | ICD-10-CM | POA: Diagnosis not present

## 2019-09-24 LAB — SARS CORONAVIRUS 2 (TAT 6-24 HRS): SARS Coronavirus 2: NEGATIVE

## 2019-09-26 ENCOUNTER — Other Ambulatory Visit (HOSPITAL_COMMUNITY): Payer: Self-pay

## 2019-09-26 ENCOUNTER — Ambulatory Visit (HOSPITAL_COMMUNITY)
Admission: RE | Admit: 2019-09-26 | Discharge: 2019-09-26 | Disposition: A | Discharge status: Home / Self Care | Payer: Self-pay | Source: Ambulatory Visit | Attending: Cardiovascular Disease | Admitting: Cardiovascular Disease

## 2019-09-26 ENCOUNTER — Other Ambulatory Visit: Payer: Self-pay

## 2019-09-26 ENCOUNTER — Encounter (HOSPITAL_COMMUNITY): Payer: Self-pay

## 2019-09-26 DIAGNOSIS — R002 Palpitations: Secondary | ICD-10-CM | POA: Insufficient documentation

## 2019-09-26 DIAGNOSIS — R079 Chest pain, unspecified: Secondary | ICD-10-CM | POA: Insufficient documentation

## 2019-09-26 NOTE — Progress Notes (Signed)
*  PRELIMINARY RESULTS* Echocardiogram 2D Echocardiogram has been performed.  Leavy Cella 09/26/2019, 12:20 PM

## 2019-09-27 ENCOUNTER — Telehealth: Payer: Self-pay | Admitting: Cardiovascular Disease

## 2019-09-27 LAB — EXERCISE TOLERANCE TEST
Estimated workload: 7 METS
Exercise duration (min): 4 min
Exercise duration (sec): 22 s
MPHR: 176 {beats}/min
Peak HR: 139 {beats}/min
Percent HR: 78 %
RPE: 19
Rest HR: 83 {beats}/min

## 2019-09-27 NOTE — Telephone Encounter (Signed)
Patient aware of results.

## 2019-09-27 NOTE — Telephone Encounter (Signed)
Patient returned call for her Echo results.  

## 2019-09-29 ENCOUNTER — Encounter: Payer: Self-pay | Admitting: Family Medicine

## 2019-09-29 ENCOUNTER — Telehealth (INDEPENDENT_AMBULATORY_CARE_PROVIDER_SITE_OTHER): Payer: Self-pay | Admitting: Family Medicine

## 2019-09-29 DIAGNOSIS — F411 Generalized anxiety disorder: Secondary | ICD-10-CM

## 2019-09-29 DIAGNOSIS — J0110 Acute frontal sinusitis, unspecified: Secondary | ICD-10-CM

## 2019-09-29 DIAGNOSIS — R002 Palpitations: Secondary | ICD-10-CM

## 2019-09-29 MED ORDER — AMOXICILLIN-POT CLAVULANATE 875-125 MG PO TABS: 1.0000 | ORAL_TABLET | Freq: Two times a day (BID) | ORAL | 0 refills | Status: DC

## 2019-09-29 NOTE — Progress Notes (Signed)
MyChart Video visit  Subjective: CC: med refills PCP: Terald Sleeper, PA-C [id number] Cheryl Randall is a 44 y.o. female. Patient provides verbal consent for consult held via video.  Due to COVID-19 pandemic this visit was conducted virtually. This visit type was conducted due to national recommendations for restrictions regarding the COVID-19 Pandemic (e.g. social distancing, sheltering in place) in an effort to limit this patient's exposure and mitigate transmission in our community. All issues noted in this document were discussed and addressed.  A physical exam was not performed with this format.   Location of patient: home Location of provider: [facility] Others present for call: none  1. Sinusitis Patient reports onset in the last couple of days.  She reports having had an old bottle of Amoxicillin and used one last night.  She has been having nasal congestion, sneezing, rhinorrhea, sinus pressure and headache.  Does not report any purulence or fevers.  Does not report any cough or hemoptysis.  2.  Heart palpitations Patient reports that she was started on a beta-blocker recently for heart palpitations.  She has had cardiac evaluation and overall had a good checkup.  So far she is doing okay on the beta-blocker.  She does admit to fatigue.  She is had quite a bit of weight gain in the last year due to back issues.  She denies any snoring.  Has never been evaluated for sleep apnea  3.  Anxiety disorder Patient reports compliance with Celexa.  She reports good control of symptoms with this.   ROS: Per HPI  Allergies  Allergen Reactions  . Codeine Itching and Rash  . Latex Itching and Rash  . Norco [Hydrocodone-Acetaminophen] Itching and Rash  . Tape Itching and Rash   Past Medical History:  Diagnosis Date  . Anxiety   . Chronic pain syndrome 11/25/2016  . DDD (degenerative disc disease)   . 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 Deep incisional surgical site infection  07/01/2015  . Diabetes mellitus without complication ([place])    borderline; diet controlled  . Essential hypertension 07/26/2019  . Family history of adverse reaction to anesthesia    Patients son has bad N/V  . GAD (generalized anxiety disorder) 06/26/2017  . Generalized edema 03/24/2017  . 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[place]) 08/21/2017  . 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  . Migraine   . Palpitations 07/26/2019  . Panic attack 06/26/2017  . PONV (postoperative nausea and vomiting)   . 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 Staphylococcus aureus bacteremia with sepsis ([place])   . Wound infection 06/30/2015    Current Outpatient Medications:  .  acetaminophen (TYLENOL) 325 MG tablet, Take 650 mg by mouth every 6 (six) hours as needed for mild pain., Disp: , Rfl:  .  citalopram (CELEXA) 40 MG tablet, Take 1 tablet (40 mg total) by mouth daily. (Needs to be seen before next refill), Disp: 30 tablet, Rfl: 11 .  hydrOXYzine (VISTARIL) 25 MG capsule, Take 1 capsule (25 mg total) by mouth every 8 (eight) hours as needed for anxiety., Disp: 60 capsule, Rfl: 5 .  ibuprofen (ADVIL) 800 MG tablet, TAKE ONE TABLET EVERY 8 HOURS AS NEEDED, Disp: 90 tablet, Rfl: 11 .  metoprolol succinate (TOPROL XL) 25 MG 24 hr tablet, Take 1 tablet (25 mg total) by mouth daily., Disp: 90 tablet, Rfl: 3 .  SUMAtriptan (IMITREX) 50 MG tablet, TAKE 1 TABLET AT START OF HEADACHE REPEAT IN 4 HOURS IF NEEDED, Disp: 9 tablet,  Rfl: 5  Gen: well appearing. NAD HEENT: sniffling, rubbing nose Pulm: normal work of breathing on room ai Psych: mood stable, speech normal, pleasant and interactive.  Assessment/ Plan: 44 y.o. female   1. Acute non-recurrent frontal sinusitis Empiric treatment with Augmentin.  Claritin for sneezing, rhinorrhea.  Follow up prn - amoxicillin-clavulanate (AUGMENTIN) 875-125 MG tablet; Take 1 tablet by mouth 2 (two) times daily.  Dispense: 20 tablet; Refill: 0  2. GAD (generalized  anxiety disorder) Controlled w/ SSRI. No refills needed  3. Palpitations Stable with BB.  BP has been well controlled.  Will reach out to Dr Johnsie Cancel about possible sleep apnea test.  May benefit if has ongoing daytime sleepiness, weight gain.   Start time: 10:18am End time: 10:31am  Total time spent on patient care (including video visit/ documentation): 20 minutes  [place], [place] [phone]

## 2019-09-29 NOTE — Patient Instructions (Signed)

## 2019-11-03 NOTE — Progress Notes (Deleted)
CARDIOLOGY CONSULT NOTE       Patient ID: Cheryl Randall MRN: [id number] DOB/AGE: [birth year] 44 y.o.  Admit date: (Not on file) Referring Physician: Primary below Primary Physician: Janora Norlander, DO Primary Cardiologist: New Reason for Consultation: Chest Pain  Active Problems:   * No active hospital problems. *   HPI:  44 y.o. referred by [name] [facility] PA-C for chest pain , palpitations and HTN. First seen on 08/25/19  She has also had dyspnea and anxiety. Reviewed primary note from 07/26/19 By description ? Vagal reaction singing at church Hot/weak felt like she was going to pass out then had some palpitations EMS noted good vital signs She indicates having palpitations for " quite some time " Concerned something is wrong with her heart. Family history of premature CAD ECG done at appointment showed NsR rate 89 low voltage otherwise normal Labs including TSH/Hct normal and LDL noted at 90  She had echo in 2017 when hospitalized for fever normal EF 55-60%   She was started on lisinopril for HTN and BP a bit low now   Has 5 brothers and 2 sisters son [name] has Asperger's   Monitor 09/21/19 with PAC;s short burst SVT started on beta blocker and ACE d/c Echo:  09/26/19 EF 50-55% no significant valve disease ETT:  09/27/19:  Negative but only reached 78% PMHR  Ex 4 min 22 seconds   ***  ROS All other systems reviewed and negative except as noted above  Past Medical History:  Diagnosis Date  . Anxiety   . Chronic pain syndrome 11/25/2016  . DDD (degenerative disc disease)   . 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 Deep incisional surgical site infection 07/01/2015  . Diabetes mellitus without complication ([place])    borderline; diet controlled  . Essential hypertension 07/26/2019  . Family history of adverse reaction to anesthesia    Patients son has bad N/V  . GAD (generalized anxiety disorder) 06/26/2017  . Generalized edema 03/24/2017  . Infection  of lumbar spine ([place]) 08/21/2017  . 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  . Migraine   . Palpitations 07/26/2019  . Panic attack 06/26/2017  . PONV (postoperative nausea and vomiting)   . 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 Staphylococcus aureus bacteremia with sepsis ([place])   . Wound infection 06/30/2015    Family History  Problem Relation Age of Onset  . Cancer Father        bladder    Social History   Socioeconomic History  . Marital status: Legally Separated    Spouse name: Not on file  . Number of children: Not on file  . Years of education: Not on file  . Highest education level: Not on file  Occupational History  . Not on file  Tobacco Use  . Smoking status: Former Research scientist (life sciences)  . Smokeless tobacco: Never Used  Substance and Sexual Activity  . Alcohol use: No  . Drug use: No  . Sexual activity: Not on file  Other Topics Concern  . Not on file  Social History Narrative  . Not on file   Social Determinants of Health   Financial Resource Strain:   . Difficulty of Paying Living Expenses:   Food Insecurity:   . Worried About Charity fundraiser in the Last Year:   . Arboriculturist in the Last Year:   Transportation Needs:   . Film/video editor (Medical):   Marland Kitchen Lack of Transportation (Non-Medical):  Physical Activity:   . Days of Exercise per Week:   . Minutes of Exercise per Session:   Stress:   . Feeling of Stress :   Social Connections:   . Frequency of Communication with Friends and Family:   . Frequency of Social Gatherings with Friends and Family:   . Attends Religious Services:   . Active Member of Clubs or Organizations:   . Attends Archivist Meetings:   Marland Kitchen Marital Status:   Intimate Partner Violence:   . Fear of Current or Ex-Partner:   . Emotionally Abused:   Marland Kitchen Physically Abused:   . Sexually Abused:     Past Surgical History:  Procedure Laterality Date  . ABDOMINAL HYSTERECTOMY    . BACK SURGERY     X 2  .  BREAST SURGERY Bilateral    reduction  . CHOLECYSTECTOMY    . LUMBAR LAMINECTOMY/DECOMPRESSION MICRODISCECTOMY N/A 06/30/2015   Procedure: wound exploration irrigation and debridement ,explantation of bone growth stimulator, placement of wound vac;  Surgeon: Kevan Ny Ditty, MD;  Location: [facility];  Service: Neurosurgery;  Laterality: N/A;      Current Outpatient Medications:  .  acetaminophen (TYLENOL) 325 MG tablet, Take 650 mg by mouth every 6 (six) hours as needed for mild pain., Disp: , Rfl:  .  amoxicillin-clavulanate (AUGMENTIN) 875-125 MG tablet, Take 1 tablet by mouth 2 (two) times daily., Disp: 20 tablet, Rfl: 0 .  citalopram (CELEXA) 40 MG tablet, Take 1 tablet (40 mg total) by mouth daily. (Needs to be seen before next refill), Disp: 30 tablet, Rfl: 11 .  hydrOXYzine (VISTARIL) 25 MG capsule, Take 1 capsule (25 mg total) by mouth every 8 (eight) hours as needed for anxiety., Disp: 60 capsule, Rfl: 5 .  ibuprofen (ADVIL) 800 MG tablet, TAKE ONE TABLET EVERY 8 HOURS AS NEEDED, Disp: 90 tablet, Rfl: 11 .  metoprolol succinate (TOPROL XL) 25 MG 24 hr tablet, Take 1 tablet (25 mg total) by mouth daily., Disp: 90 tablet, Rfl: 3 .  SUMAtriptan (IMITREX) 50 MG tablet, TAKE 1 TABLET AT START OF HEADACHE REPEAT IN 4 HOURS IF NEEDED, Disp: 9 tablet, Rfl: 5    Physical Exam: Last menstrual period 02/24/2013.    Affect appropriate Healthy:  appears stated age 44: normal Neck supple with no adenopathy JVP normal no bruits no thyromegaly Lungs clear with no wheezing and good diaphragmatic motion Heart:  S1/S2 no murmur, no rub, gallop or click PMI normal Abdomen: benighn, BS positve, no tenderness, no AAA no bruit.  No HSM or HJR Distal pulses intact with no bruits No edema Neuro non-focal Skin warm and dry No muscular weakness   Labs:   Lab Results  Component Value Date   WBC 5.9 07/26/2019   HGB 13.2 07/26/2019   HCT 40.4 07/26/2019   MCV 86 07/26/2019   PLT  220 07/26/2019   No results for input(s): NA, K, CL, CO2, BUN, CREATININE, CALCIUM, PROT, BILITOT, ALKPHOS, ALT, AST, GLUCOSE in the last 168 hours.  Invalid input(s): LABALBU Lab Results  Component Value Date   TROPONINI <0.03 01/13/2016    Lab Results  Component Value Date   CHOL 165 07/26/2019   CHOL 121 11/06/2017   Lab Results  Component Value Date   HDL 59 07/26/2019   HDL 48 11/06/2017   Lab Results  Component Value Date   LDLCALC 90 07/26/2019   LDLCALC 62 11/06/2017   Lab Results  Component Value Date   TRIG  87 07/26/2019   TRIG 53 11/06/2017   Lab Results  Component Value Date   CHOLHDL 2.8 07/26/2019   CHOLHDL 2.5 11/06/2017   No results found for: LDLDIRECT    Radiology: No results found.  EKG: 07/26/19 SR rate 84 normal    ASSESSMENT AND PLAN:   1. Chest pain; atypical normal ECG CRF;s family history and HTN ETT normal 09/26/19 but low exercise level *** 2.  HTN:  Continue beta blocker  3.  Anxiety: continue Celexa f/u primary  4.  Palpitations :  Monitor with no significant arrhythmia on beta blocker  5. Pre syncope: likely vagal see above no high risk family history , exam or ECG   Signed: Jenkins Rouge 11/03/2019, 4:40 PM

## 2019-11-07 NOTE — Progress Notes (Signed)
CARDIOLOGY CONSULT NOTE       Patient ID: Cheryl Randall MRN: [id number] DOB/AGE: 01-11-1976 44 y.o.  Admit date: (Not on file) Referring Physician: Primary below Primary Physician: Janora Norlander, DO Primary Cardiologist: New Reason for Consultation: Chest Pain  Active Problems:   * No active hospital problems. *   HPI:  44 y.o. referred by [name] [facility] PA-C for chest pain , palpitations and HTN. First seen on 08/25/19  She has also had dyspnea and anxiety. Reviewed primary note from 07/26/19 By description ? Vagal reaction singing at church Hot/weak felt like she was going to pass out then had some palpitations EMS noted good vital signs She indicates having palpitations for " quite some time " Concerned something is wrong with her heart. Family history of premature CAD ECG done at appointment showed NsR rate 89 low voltage otherwise normal Labs including TSH/Hct normal and LDL noted at 90  She had echo in 2017 when hospitalized for fever normal EF 55-60%   She was started on lisinopril for HTN and BP a bit low now   Has 5 brothers and 2 sisters son [name] has Asperger's but working at [employer] 09/21/19 with PAC;s short burst SVT started on beta blocker and ACE d/c Echo:  09/26/19 EF 50-55% no significant valve disease ETT:  09/27/19:  Negative but only reached 78% PMHR  Ex 4 min 22 seconds   She feels great but still has occasional palpitations    ROS All other systems reviewed and negative except as noted above  Past Medical History:  Diagnosis Date  . Anxiety   . Chronic pain syndrome 11/25/2016  . DDD (degenerative disc disease)   . 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 Deep incisional surgical site infection 07/01/2015  . Diabetes mellitus without complication ([place])    borderline; diet controlled  . Essential hypertension 07/26/2019  . Family history of adverse reaction to anesthesia    Patients son has bad N/V  . GAD (generalized  anxiety disorder) 06/26/2017  . Generalized edema 03/24/2017  . 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[place]) 08/21/2017  . 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  . Migraine   . Palpitations 07/26/2019  . Panic attack 06/26/2017  . PONV (postoperative nausea and vomiting)   . 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 Staphylococcus aureus bacteremia with sepsis ([place])   . Wound infection 06/30/2015    Family History  Problem Relation Age of Onset  . Cancer Father        bladder    Social History   Socioeconomic History  . Marital status: Legally Separated    Spouse name: Not on file  . Number of children: Not on file  . Years of education: Not on file  . Highest education level: Not on file  Occupational History  . Not on file  Tobacco Use  . Smoking status: Former Research scientist (life sciences)  . Smokeless tobacco: Never Used  Vaping Use  . Vaping Use: Never used  Substance and Sexual Activity  . Alcohol use: No  . Drug use: No  . Sexual activity: Not on file  Other Topics Concern  . Not on file  Social History Narrative  . Not on file   Social Determinants of Health   Financial Resource Strain:   . Difficulty of Paying Living Expenses:   Food Insecurity:   . Worried About Charity fundraiser in the Last Year:   . [employer]  in the Last Year:   Transportation Needs:   . Film/video editor (Medical):   Marland Kitchen Lack of Transportation (Non-Medical):   Physical Activity:   . Days of Exercise per Week:   . Minutes of Exercise per Session:   Stress:   . Feeling of Stress :   Social Connections:   . Frequency of Communication with Friends and Family:   . Frequency of Social Gatherings with Friends and Family:   . Attends Religious Services:   . Active Member of Clubs or Organizations:   . Attends Archivist Meetings:   Marland Kitchen Marital Status:   Intimate Partner Violence:   . Fear of Current or Ex-Partner:   . Emotionally Abused:   Marland Kitchen Physically Abused:   . Sexually Abused:      Past Surgical History:  Procedure Laterality Date  . ABDOMINAL HYSTERECTOMY    . BACK SURGERY     X 2  . BREAST SURGERY Bilateral    reduction  . CHOLECYSTECTOMY    . LUMBAR LAMINECTOMY/DECOMPRESSION MICRODISCECTOMY N/A 06/30/2015   Procedure: wound exploration irrigation and debridement ,explantation of bone growth stimulator, placement of wound vac;  Surgeon: Kevan Ny Ditty, MD;  Location: [facility];  Service: Neurosurgery;  Laterality: N/A;      Current Outpatient Medications:  .  acetaminophen (TYLENOL) 325 MG tablet, Take 650 mg by mouth every 6 (six) hours as needed for mild pain., Disp: , Rfl:  .  citalopram (CELEXA) 40 MG tablet, Take 1 tablet (40 mg total) by mouth daily. (Needs to be seen before next refill), Disp: 30 tablet, Rfl: 11 .  hydrOXYzine (VISTARIL) 25 MG capsule, Take 1 capsule (25 mg total) by mouth every 8 (eight) hours as needed for anxiety., Disp: 60 capsule, Rfl: 5 .  ibuprofen (ADVIL) 800 MG tablet, TAKE ONE TABLET EVERY 8 HOURS AS NEEDED, Disp: 90 tablet, Rfl: 11 .  metoprolol succinate (TOPROL XL) 25 MG 24 hr tablet, Take 1 tablet (25 mg total) by mouth daily., Disp: 90 tablet, Rfl: 3 .  SUMAtriptan (IMITREX) 50 MG tablet, TAKE 1 TABLET AT START OF HEADACHE REPEAT IN 4 HOURS IF NEEDED, Disp: 9 tablet, Rfl: 5 .  amoxicillin-clavulanate (AUGMENTIN) 875-125 MG tablet, Take 1 tablet by mouth 2 (two) times daily., Disp: 20 tablet, Rfl: 0    Physical Exam: Blood pressure 122/78, pulse 83, height 5\' 7"  (1.702 m), weight 290 lb (131.5 kg), last menstrual period 02/24/2013, SpO2 97 %.    Affect appropriate Healthy:  appears stated age 44: normal Neck supple with no adenopathy JVP normal no bruits no thyromegaly Lungs clear with no wheezing and good diaphragmatic motion Heart:  S1/S2 no murmur, no rub, gallop or click PMI normal Abdomen: benighn, BS positve, no tenderness, no AAA no bruit.  No HSM or HJR Distal pulses intact with no bruits No  edema Neuro non-focal Skin warm and dry No muscular weakness   Labs:   Lab Results  Component Value Date   WBC 5.9 07/26/2019   HGB 13.2 07/26/2019   HCT 40.4 07/26/2019   MCV 86 07/26/2019   PLT 220 07/26/2019   No results for input(s): NA, K, CL, CO2, BUN, CREATININE, CALCIUM, PROT, BILITOT, ALKPHOS, ALT, AST, GLUCOSE in the last 168 hours.  Invalid input(s): LABALBU Lab Results  Component Value Date   TROPONINI <0.03 01/13/2016    Lab Results  Component Value Date   CHOL 165 07/26/2019   CHOL 121 11/06/2017   Lab Results  Component Value Date   HDL 59 07/26/2019   HDL 48 11/06/2017   Lab Results  Component Value Date   LDLCALC 90 07/26/2019   LDLCALC 62 11/06/2017   Lab Results  Component Value Date   TRIG 87 07/26/2019   TRIG 53 11/06/2017   Lab Results  Component Value Date   CHOLHDL 2.8 07/26/2019   CHOLHDL 2.5 11/06/2017   No results found for: LDLDIRECT    Radiology: No results found.  EKG: 07/26/19 SR rate 84 normal    ASSESSMENT AND PLAN:   1. Chest pain; atypical normal ECG CRF;s family history and HTN ETT normal 09/26/19 but low exercise level observe  2.  HTN:  Continue beta blocker  3.  Anxiety: continue Celexa f/u primary  4.  Palpitations :  Monitor with no significant arrhythmia on beta blocker increase Toprol to 50 mg daily and observe  5. Pre syncope: likely vagal see above no high risk family history , exam or ECG   Signed: Jenkins Rouge 11/10/2019, 1:46 PM

## 2019-11-10 ENCOUNTER — Ambulatory Visit: Payer: Self-pay | Admitting: Cardiovascular Disease

## 2019-11-10 ENCOUNTER — Other Ambulatory Visit: Payer: Self-pay

## 2019-11-10 ENCOUNTER — Ambulatory Visit (INDEPENDENT_AMBULATORY_CARE_PROVIDER_SITE_OTHER): Payer: Self-pay | Admitting: Cardiovascular Disease

## 2019-11-10 ENCOUNTER — Encounter: Payer: Self-pay | Admitting: Cardiovascular Disease

## 2019-11-10 VITALS — BP 122/78 | HR 83 | Ht 67.0 in | Wt 290.0 lb

## 2019-11-10 DIAGNOSIS — R002 Palpitations: Secondary | ICD-10-CM

## 2019-11-10 MED ORDER — METOPROLOL SUCCINATE ER 50 MG PO TB24
50.0000 mg | ORAL_TABLET | Freq: Every day | ORAL | 3 refills | Status: DC
Start: 2019-11-10 — End: 2020-07-25

## 2019-11-10 NOTE — Patient Instructions (Signed)
Medication Instructions:  Increase Toprol XL  to 50 mg daily   *If you need a refill on your cardiac medications before your next appointment, please call your pharmacy*   Lab Work: None ordered   If you have labs (blood work) drawn today and your tests are completely normal, you will receive your results only by: Marland Kitchen MyChart Message (if you have MyChart) OR . A paper copy in the mail If you have any lab test that is abnormal or we need to change your treatment, we will call you to review the results.   Testing/Procedures: None ordered    Follow-Up: At [facility], you and your health needs are our priority.  As part of our continuing mission to provide you with exceptional heart care, we have created designated Provider Care Teams.  These Care Teams include your primary Cardiologist (physician) and Advanced Practice Providers (APPs -  Physician Assistants and Nurse Practitioners) who all work together to provide you with the care you need, when you need it.  We recommend signing up for the patient portal called "MyChart".  Sign up information is provided on this After Visit Summary.  MyChart is used to connect with patients for Virtual Visits (Telemedicine).  Patients are able to view lab/test results, encounter notes, upcoming appointments, etc.  Non-urgent messages can be sent to your provider as well.   To learn more about what you can do with MyChart, go to [email or web address].    Your next appointment:   12 month(s)  The format for your next appointment:   In Person  Provider:   Jenkins Rouge, MD   Other Instructions None

## 2019-12-12 ENCOUNTER — Other Ambulatory Visit: Payer: Self-pay | Admitting: Family Medicine

## 2019-12-12 DIAGNOSIS — M5137 Other intervertebral disc degeneration, lumbosacral region: Secondary | ICD-10-CM

## 2019-12-12 DIAGNOSIS — G894 Chronic pain syndrome: Secondary | ICD-10-CM

## 2019-12-12 DIAGNOSIS — S32009K Unspecified fracture of unspecified lumbar vertebra, subsequent encounter for fracture with nonunion: Secondary | ICD-10-CM

## 2020-01-18 ENCOUNTER — Encounter: Payer: Self-pay | Admitting: Family Medicine

## 2020-01-18 ENCOUNTER — Ambulatory Visit (INDEPENDENT_AMBULATORY_CARE_PROVIDER_SITE_OTHER): Payer: Self-pay | Admitting: Family Medicine

## 2020-01-18 ENCOUNTER — Other Ambulatory Visit: Payer: Self-pay

## 2020-01-18 VITALS — BP 114/75 | HR 73 | Temp 97.5°F | Ht 67.0 in | Wt 294.0 lb

## 2020-01-18 DIAGNOSIS — F411 Generalized anxiety disorder: Secondary | ICD-10-CM

## 2020-01-18 DIAGNOSIS — M961 Postlaminectomy syndrome, not elsewhere classified: Secondary | ICD-10-CM | POA: Insufficient documentation

## 2020-01-18 DIAGNOSIS — R208 Other disturbances of skin sensation: Secondary | ICD-10-CM

## 2020-01-18 DIAGNOSIS — S32009K Unspecified fracture of unspecified lumbar vertebra, subsequent encounter for fracture with nonunion: Secondary | ICD-10-CM

## 2020-01-18 DIAGNOSIS — M5137 Other intervertebral disc degeneration, lumbosacral region: Secondary | ICD-10-CM

## 2020-01-18 DIAGNOSIS — M549 Dorsalgia, unspecified: Secondary | ICD-10-CM

## 2020-01-18 DIAGNOSIS — G894 Chronic pain syndrome: Secondary | ICD-10-CM

## 2020-01-18 DIAGNOSIS — F410 Panic disorder [episodic paroxysmal anxiety] without agoraphobia: Secondary | ICD-10-CM

## 2020-01-18 DIAGNOSIS — Z9889 Other specified postprocedural states: Secondary | ICD-10-CM | POA: Insufficient documentation

## 2020-01-18 MED ORDER — IBUPROFEN 800 MG PO TABS: 800.0000 mg | ORAL_TABLET | Freq: Three times a day (TID) | ORAL | 12 refills | Status: DC | PRN

## 2020-01-18 MED ORDER — BUSPIRONE HCL 10 MG PO TABS: ORAL_TABLET | ORAL | 2 refills | Status: AC

## 2020-01-18 NOTE — Progress Notes (Signed)
Subjective: CC: est care, chronic low back pain with history of spinal surgery complication, anxiety and depression PCP: Janora Norlander, DO CHE:NIDPO D Salyers is a 44 y.o. female presenting to clinic today for:  1. Degenerative disc disease Patient with history of her first low back surgery in her 38s. She had a bulging disc repaired. 1 year later, this disks ruptured and she subsequently underwent discectomy. She ultimately did have to undergo cage and 4 screws in the back. She worked on weight loss and unfortunately continued to have quite a bit of back pain. She is status post treatment with injection therapy in the back. It was thought that the ongoing back pain was secondary to scar tissue but she had a discogram performed and it was found that she had a broken screw in her back. She again had another revision surgery where she had a bone stimulator placed about 5 years ago. This unfortunately led to her becoming septic and all the hardware had to be removed and replaced. The bone stimulator of course was not replaced. She was subsequently treated with IV antibiotics via PICC line for quite some time prior to being able to return to some sense of normalcy. She did apply for disability but was only given partial disability, just enough to help with her bills. She is totally against use of opioid medications because she feels that those have too much potential for dependence and addiction. She instead uses Motrin 800 mg regularly to stay somewhat functional. However, prolonged sitting, standing causes exacerbation of the back pain. This typically is worse on the left than the right and often radiates to her buttocks. She has hypersensitivity of the back and skin such that nothing can even touch that area without her feeling some sense of discomfort or pain. She does not report any sensory changes in the lower extremity or inability to urinate, defecate. She has been treated with gabapentin in the  past but she was intolerant to this medication. Her back surgeon has since retired. The surgeon that did her revision surgery was at [facility] but she is unsure of the name, as when he saw her it was in an emergency setting.  After further discussion she goes on to inform you that she had previously applied for government assistance and "worked 9 days too many" and this subsequently led to her getting a felony charge. She notes that this work oversight was most certainly not intentional and she unfortunately has bared this burden since. She was stripped of her voter registration card, concealed carry and essentially vowed to never seek assistance for anything again. She was quite devastated over this event. She does have both anxiety and depression which she takes Celexa and Vistaril for.  She is hesitant to seek appeal for disability because of the aforementioned events. Though she certainly admits that she needs it. She tries to help her mother and brother as much as she can but again her physical limitations impair her to do as much that she would want to do.  ROS: Per HPI  Allergies  Allergen Reactions  . Codeine Itching and Rash  . Latex Itching and Rash  . Norco [Hydrocodone-Acetaminophen] Itching and Rash  . Tape Itching and Rash   Past Medical History:  Diagnosis Date  . Anxiety   . Chronic pain syndrome 11/25/2016  . DDD (degenerative disc disease)   . 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 Deep incisional surgical site infection 07/01/2015  .  Diabetes mellitus without complication ([place])    borderline; diet controlled  . Essential hypertension 07/26/2019  . Family history of adverse reaction to anesthesia    Patients son has bad N/V  . GAD (generalized anxiety disorder) 06/26/2017  . Generalized edema 03/24/2017  . 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  . Migraine   . Palpitations 07/26/2019  . Panic  attack 06/26/2017  . PONV (postoperative nausea and vomiting)   . 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 Staphylococcus aureus bacteremia with sepsis ([place])   . Wound infection 06/30/2015    Current Outpatient Medications:  .  acetaminophen (TYLENOL) 325 MG tablet, Take 650 mg by mouth every 6 (six) hours as needed for mild pain., Disp: , Rfl:  .  amoxicillin-clavulanate (AUGMENTIN) 875-125 MG tablet, Take 1 tablet by mouth 2 (two) times daily., Disp: 20 tablet, Rfl: 0 .  citalopram (CELEXA) 40 MG tablet, Take 1 tablet (40 mg total) by mouth daily. (Needs to be seen before next refill), Disp: 30 tablet, Rfl: 11 .  hydrOXYzine (VISTARIL) 25 MG capsule, Take 1 capsule (25 mg total) by mouth every 8 (eight) hours as needed for anxiety., Disp: 60 capsule, Rfl: 5 .  ibuprofen (ADVIL) 800 MG tablet, Take 1 tablet (800 mg total) by mouth every 8 (eight) hours as needed. (Needs to be seen before next refill), Disp: 90 tablet, Rfl: 0 .  metoprolol succinate (TOPROL XL) 50 MG 24 hr tablet, Take 1 tablet (50 mg total) by mouth daily., Disp: 90 tablet, Rfl: 3 .  SUMAtriptan (IMITREX) 50 MG tablet, TAKE 1 TABLET AT START OF HEADACHE REPEAT IN 4 HOURS IF NEEDED, Disp: 9 tablet, Rfl: 5 Social History   Socioeconomic History  . Marital status: Legally Separated    Spouse name: Not on file  . Number of children: Not on file  . Years of education: Not on file  . Highest education level: Not on file  Occupational History  . Not on file  Tobacco Use  . Smoking status: Former Research scientist (life sciences)  . Smokeless tobacco: Never Used  Vaping Use  . Vaping Use: Never used  Substance and Sexual Activity  . Alcohol use: No  . Drug use: No  . Sexual activity: Not on file  Other Topics Concern  . Not on file  Social History Narrative  . Not on file   Social Determinants of Health   Financial Resource Strain:   . Difficulty of Paying Living Expenses:   Food Insecurity:   . Worried About Charity fundraiser in the  Last Year:   . Arboriculturist in the Last Year:   Transportation Needs:   . Film/video editor (Medical):   Marland Kitchen Lack of Transportation (Non-Medical):   Physical Activity:   . Days of Exercise per Week:   . Minutes of Exercise per Session:   Stress:   . Feeling of Stress :   Social Connections:   . Frequency of Communication with Friends and Family:   . Frequency of Social Gatherings with Friends and Family:   . Attends Religious Services:   . Active Member of Clubs or Organizations:   . Attends Archivist Meetings:   Marland Kitchen Marital Status:   Intimate Partner Violence:   . Fear of Current or Ex-Partner:   . Emotionally Abused:   Marland Kitchen Physically Abused:   . Sexually Abused:    Family History  Problem Relation Age of Onset  .  Heart disease Mother   . Diabetes Mother   . Cancer Father        bladder  . Autism Brother   . Diabetes Brother   . Heart disease Brother   . Diabetes Brother   . Diabetes Brother     Objective: Office vital signs reviewed. BP 114/75   Pulse 73   Temp (!) 97.5 F (36.4 C)   Ht 5\' 7"  (1.702 m)   Wt 294 lb (133.4 kg)   LMP 02/24/2013   SpO2 96%   BMI 46.05 kg/m   Physical Examination:  General: Awake, alert, morbidly obese, No acute distress HEENT: Normal, sclera white, MMM Cardio: regular rate and rhythm, S1S2 heard, no murmurs appreciated Pulm: clear to auscultation bilaterally, no wheezes, rhonchi or rales; normal work of breathing on room air MSK: stiff, antalgic gait and station  Lumbar spine: Flattening of the lumbosacral curvature. She has a well-healed, wide based scar at the lumbar region. She has limited active range of motion in all planes. There is hyperalgesia noted along the left lumbar area in the paraspinal and low back spaces. She is exquisitely tender to palpation over the lower lumbar/lumbosacral areas bilaterally. Skin: dry; intact; no rashes or lesions Neuro: 5/5 lower extremity strength in all planes except with  flexion of the knee on the left side and light touch sensation reduced in an L5 dermatomal pattern on the left Psych: Patient is quite tearful when discussing her mental health and past legal matters. Depression screen [facility] 2/9 01/18/2020 07/26/2019 06/22/2018  Decreased Interest - 2 0  Down, Depressed, Hopeless 1 0 0  PHQ - 2 Score 1 2 0  Altered sleeping 1 3 -  Tired, decreased energy 3 3 -  Change in appetite 0 0 -  Feeling bad or failure about yourself  0 0 -  Trouble concentrating 0 0 -  Moving slowly or fidgety/restless - 0 -  Suicidal thoughts 0 0 -  PHQ-9 Score 5 8 -  Difficult doing work/chores - Somewhat difficult -   GAD 7 : Generalized Anxiety Score 01/18/2020 07/26/2019  Nervous, Anxious, on Edge 1 3  Control/stop worrying 0 0  Worry too much - different things 0 0  Trouble relaxing 1 3  Restless 0 0  Easily annoyed or irritable 0 0  Afraid - awful might happen 0 0  Total GAD 7 Score 2 6  Anxiety Difficulty - Somewhat difficult    Assessment/ Plan: 44 y.o. female   She has quite a complicated history. She has done her best to totally avoid medications that are addictive and has tried to stay as functional as possible with nonopioid therapies. She knows that her weight is contributing to worsening back pain but again her activity is limited secondary to chronic back pain. It is quite a vicious cycle. She has failed back surgeries, back injections thus far. She stays ambulatory but ambulation is limited secondary to pain. She certainly has hyperalgesia on exam today. We discussed that she most certainly would benefit from referral to a spinal surgeon. We discussed she likely would need repeat MRI but unsure if the hardware she has in her back is compatible with MRI. I have asked that she complete can financial assistance since she is totally adamant about not pursuing government medical assistance given her past experience with government programs. I think given her limitations  that she should consider reevaluation by a disability doctor and lawyer.  Given ongoing anxiety with panic and what  sounds to be a level of PTSD, I have added buspirone for her to start at nighttime. I have encouraged her to take this twice daily should she have daytime anxiety symptoms as well. I would like to see her back in about 6 to 8 weeks to see how the BuSpar is working. Hopefully by that time she will have the [place] financial assistance and we can get her referred to her spinal surgeon again as well (last surgeon seen Dr Suezanne Jacquet Ditty)  Back pain with history of spinal surgery  DDD (degenerative disc disease), lumbosacral  Hyperalgesia  Chronic pain syndrome  Generalized anxiety disorder with panic attacks - Plan: busPIRone (BUSPAR) 10 MG tablet  No orders of the defined types were placed in this encounter.  Meds ordered this encounter  Medications  . busPIRone (BUSPAR) 10 MG tablet    Sig: Take 1 tablet (10 mg total) by mouth at bedtime for 14 days, THEN 1 tablet (10 mg total) 2 (two) times daily. anxiety.    Dispense:  60 tablet    Refill:  2  . ibuprofen (ADVIL) 800 MG tablet    Sig: Take 1 tablet (800 mg total) by mouth every 8 (eight) hours as needed for moderate pain.    Dispense:  90 tablet    Refill:  [place], [place] [phone]

## 2020-01-18 NOTE — Patient Instructions (Signed)
Let me know when you are ready for a referral to the spinal surgeon again  Fill out the forms for [place] financial aid so we can get you to some of these appointments.

## 2020-02-09 ENCOUNTER — Other Ambulatory Visit: Payer: Self-pay | Admitting: *Deleted

## 2020-02-09 DIAGNOSIS — F411 Generalized anxiety disorder: Secondary | ICD-10-CM

## 2020-02-09 MED ORDER — CITALOPRAM HYDROBROMIDE 40 MG PO TABS: 40.0000 mg | ORAL_TABLET | Freq: Every day | ORAL | 2 refills | Status: DC

## 2020-02-22 ENCOUNTER — Telehealth: Payer: Self-pay | Admitting: Family Medicine

## 2020-02-22 DIAGNOSIS — G43809 Other migraine, not intractable, without status migrainosus: Secondary | ICD-10-CM

## 2020-02-22 MED ORDER — SUMATRIPTAN SUCCINATE 50 MG PO TABS: ORAL_TABLET | ORAL | 5 refills | Status: DC

## 2020-02-22 NOTE — Telephone Encounter (Signed)
Med sent.

## 2020-02-22 NOTE — Telephone Encounter (Signed)
  Prescription Request  02/22/2020  What is the name of the medication or equipment? SUMAtriptan (IMITREX) 50 MG tablet    Have you contacted your pharmacy to request a refill? (if applicable) yes  Which pharmacy would you like this sent to? [facility], pt has had a headache for two days   Patient notified that their request is being sent to the clinical staff for review and that they should receive a response within 2 business days.

## 2020-03-05 ENCOUNTER — Telehealth: Payer: Self-pay | Admitting: Licensed Clinical Social Worker

## 2020-03-05 NOTE — Telephone Encounter (Signed)
Left message encouraging contact 

## 2020-03-13 ENCOUNTER — Telehealth: Payer: Self-pay | Admitting: Licensed Clinical Social Worker

## 2020-03-13 NOTE — Telephone Encounter (Signed)
[facility] spoke with Patient about [facility] referral and the program.  Patient declined services stating she does not need a therapist.  Informed her to call back at [phone] if she changes her mind.

## 2020-06-05 ENCOUNTER — Other Ambulatory Visit: Payer: Self-pay | Admitting: Family Medicine

## 2020-06-05 DIAGNOSIS — F411 Generalized anxiety disorder: Secondary | ICD-10-CM

## 2020-06-29 ENCOUNTER — Other Ambulatory Visit: Payer: Self-pay | Admitting: Family Medicine

## 2020-06-29 DIAGNOSIS — F411 Generalized anxiety disorder: Secondary | ICD-10-CM

## 2020-06-29 NOTE — Telephone Encounter (Signed)
[place] NTBS 30 days given 06/06/20

## 2020-07-02 NOTE — Telephone Encounter (Signed)
Appt made - has enough meds to last

## 2020-07-03 ENCOUNTER — Other Ambulatory Visit: Payer: Self-pay | Admitting: Family Medicine

## 2020-07-03 DIAGNOSIS — S32009K Unspecified fracture of unspecified lumbar vertebra, subsequent encounter for fracture with nonunion: Secondary | ICD-10-CM

## 2020-07-03 DIAGNOSIS — M5137 Other intervertebral disc degeneration, lumbosacral region: Secondary | ICD-10-CM

## 2020-07-03 DIAGNOSIS — F411 Generalized anxiety disorder: Secondary | ICD-10-CM

## 2020-07-03 DIAGNOSIS — G894 Chronic pain syndrome: Secondary | ICD-10-CM

## 2020-07-03 MED ORDER — CITALOPRAM HYDROBROMIDE 40 MG PO TABS: 40.0000 mg | ORAL_TABLET | Freq: Every day | ORAL | 3 refills | Status: DC

## 2020-07-09 ENCOUNTER — Ambulatory Visit: Payer: Self-pay | Admitting: Family Medicine

## 2020-07-24 ENCOUNTER — Other Ambulatory Visit: Payer: Self-pay | Admitting: Cardiovascular Disease

## 2020-07-25 NOTE — Telephone Encounter (Signed)
This is a [place] pt.  °

## 2020-09-01 ENCOUNTER — Other Ambulatory Visit: Payer: Self-pay

## 2020-09-01 ENCOUNTER — Emergency Department (HOSPITAL_COMMUNITY)
Admission: EM | Admit: 2020-09-01 | Discharge: 2020-09-01 | Disposition: A | Discharge status: Home / Self Care | Payer: Self-pay | Attending: Emergency Medicine | Admitting: Emergency Medicine

## 2020-09-01 ENCOUNTER — Emergency Department (HOSPITAL_COMMUNITY): Payer: Self-pay

## 2020-09-01 ENCOUNTER — Encounter (HOSPITAL_COMMUNITY): Payer: Self-pay | Admitting: Emergency Medicine

## 2020-09-01 DIAGNOSIS — J189 Pneumonia, unspecified organism: Secondary | ICD-10-CM

## 2020-09-01 DIAGNOSIS — I10 Essential (primary) hypertension: Secondary | ICD-10-CM | POA: Insufficient documentation

## 2020-09-01 DIAGNOSIS — Z79899 Other long term (current) drug therapy: Secondary | ICD-10-CM | POA: Insufficient documentation

## 2020-09-01 DIAGNOSIS — J181 Lobar pneumonia, unspecified organism: Secondary | ICD-10-CM | POA: Insufficient documentation

## 2020-09-01 DIAGNOSIS — Z87891 Personal history of nicotine dependence: Secondary | ICD-10-CM | POA: Insufficient documentation

## 2020-09-01 DIAGNOSIS — F419 Anxiety disorder, unspecified: Secondary | ICD-10-CM | POA: Insufficient documentation

## 2020-09-01 DIAGNOSIS — Z91040 Latex allergy status: Secondary | ICD-10-CM | POA: Insufficient documentation

## 2020-09-01 DIAGNOSIS — E119 Type 2 diabetes mellitus without complications: Secondary | ICD-10-CM | POA: Insufficient documentation

## 2020-09-01 LAB — CBC
HCT: 39.5 % (ref 36.0–46.0)
Hemoglobin: 12.6 g/dL (ref 12.0–15.0)
MCH: 28.6 pg (ref 26.0–34.0)
MCHC: 31.9 g/dL (ref 30.0–36.0)
MCV: 89.8 fL (ref 80.0–100.0)
Platelets: 200 10*3/uL (ref 150–400)
RBC: 4.4 MIL/uL (ref 3.87–5.11)
RDW: 13.2 % (ref 11.5–15.5)
WBC: 5 10*3/uL (ref 4.0–10.5)
nRBC: 0 % (ref 0.0–0.2)

## 2020-09-01 LAB — BASIC METABOLIC PANEL
Anion gap: 7 (ref 5–15)
BUN: 10 mg/dL (ref 6–20)
CO2: 26 mmol/L (ref 22–32)
Calcium: 8.9 mg/dL (ref 8.9–10.3)
Chloride: 104 mmol/L (ref 98–111)
Creatinine, Ser: 0.67 mg/dL (ref 0.44–1.00)
GFR, Estimated: >60 mL/min (ref >60)
Glucose, Bld: 114 mg/dL — ABNORMAL HIGH (ref 70–99)
Potassium: 4.3 mmol/L (ref 3.5–5.1)
Sodium: 137 mmol/L (ref 135–145)

## 2020-09-01 LAB — TROPONIN I (HIGH SENSITIVITY)
Troponin I (High Sensitivity): 4 ng/L
Troponin I (High Sensitivity): 5 ng/L (ref <18)

## 2020-09-01 LAB — BRAIN NATRIURETIC PEPTIDE: B Natriuretic Peptide: 60 pg/mL (ref 0.0–100.0)

## 2020-09-01 LAB — CBG MONITORING, ED: Glucose-Capillary: 96 mg/dL (ref 70–99)

## 2020-09-01 LAB — D-DIMER, QUANTITATIVE: D-Dimer, Quant: 0.62 ug{FEU}/mL — ABNORMAL HIGH (ref 0.00–0.50)

## 2020-09-01 MED ORDER — AZITHROMYCIN 250 MG PO TABS: 250.0000 mg | ORAL_TABLET | Freq: Every day | ORAL | 0 refills | Status: DC

## 2020-09-01 MED ORDER — IOHEXOL 350 MG/ML SOLN
100.0000 mL | Freq: Once | INTRAVENOUS | Status: AC | PRN
  Administered 2020-09-01: 100 mL via INTRAVENOUS

## 2020-09-01 MED ORDER — ASPIRIN 81 MG PO CHEW
324.0000 mg | CHEWABLE_TABLET | Freq: Once | ORAL | Status: AC
  Administered 2020-09-01: 324 mg via ORAL
  Filled 2020-09-01: qty 4

## 2020-09-01 NOTE — ED Notes (Signed)
EDP at bedside prior to triage being complete

## 2020-09-01 NOTE — Discharge Instructions (Addendum)
As discussed, your lab tests and imaging suggest an early pneumonia which is being treated with zithromax.  Take the entire course of the antibiotics prescribed.  Rest and make sure you are drinking plenty of fluids. Follow up with your primary MD as planned for your full physical.

## 2020-09-01 NOTE — ED Triage Notes (Signed)
Patient c/o shortness of breath that started 2 days ago. Per patient dry cough. Denies any fevers. Patient reports intermittent swelling in legs bilaterally.Per patient left side chest pain on Wednesday that radiated down left arm with some numbness to arm. Patient denies chest pain now but states does have some occasional with deep breath. Patient takes metoprolol for arrhythmia.

## 2020-09-01 NOTE — ED Provider Notes (Signed)
[facility] EMERGENCY DEPARTMENT Provider Note   CSN: [id number] Arrival date & time: 09/01/20  1115     History Chief Complaint  Patient presents with  . Shortness of Breath    Cheryl Randall is a 45 y.o. female with history significant for DM, HTN, migraine, palpitations (on metoprolol)  anxiety and chronic pain syndrome presenting for evaluation of nonproductive cough and shortness of breath which started 2 days ago.  She describes pressure sensation in the midsternal region which is triggered by cough and deep inspiration and is worsened when supine.  She also describes a fleeting episode of "electric shock" like pain which ran from her left chest to her left hand 3 days ago, lasting about 5 seconds and has not returned.  She does report intermittent bilateral lower extremity edema which is chronic and not worsened today.  She has a strong family history of CHF so is worried about this possibility.  She denies fevers, chills, wheezing, diaphoresis, abdominal pain or other complaints.  She has had no treatment prior to arrival for her symptoms. Distant smoking hx.  No family hx of early CAD.   The history is provided by the patient.       Past Medical History:  Diagnosis Date  . Anxiety   . Chronic pain syndrome 11/25/2016  . DDD (degenerative disc disease)   . 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 Deep incisional surgical site infection 07/01/2015  . Diabetes mellitus without complication ([place])    borderline; diet controlled  . Essential hypertension 07/26/2019  . Family history of adverse reaction to anesthesia    Patients son has bad N/V  . GAD (generalized anxiety disorder) 06/26/2017  . Generalized edema 03/24/2017  . 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  . Migraine   . Palpitations 07/26/2019  . Panic attack 06/26/2017  . PONV (postoperative nausea and vomiting)   . Pseudoarthrosis of lumbar spine  05/31/2015  . Staphylococcus aureus bacteremia with sepsis ([place])   . Wound infection 06/30/2015    Patient Active Problem List   Diagnosis Date Noted  . Back pain with history of spinal surgery 01/18/2020  . Essential hypertension 07/26/2019  . Palpitations 07/26/2019  . 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 Panic attack 06/26/2017  . GAD (generalized anxiety disorder) 06/26/2017  . Generalized edema 03/24/2017  . 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  . Chronic pain syndrome 11/25/2016  . 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 Staphylococcus aureus bacteremia with sepsis ([place])   . Deep incisional surgical site infection 07/01/2015  . Wound infection 06/30/2015  . 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    Past Surgical History:  Procedure Laterality Date  . ABDOMINAL HYSTERECTOMY    . BACK SURGERY     X 2  . BREAST SURGERY Bilateral    reduction  . CHOLECYSTECTOMY    . LUMBAR LAMINECTOMY/DECOMPRESSION MICRODISCECTOMY N/A 06/30/2015   Procedure: wound exploration irrigation and debridement ,explantation of bone growth stimulator, placement of wound vac;  Surgeon: Kevan Ny Ditty, MD;  Location: [facility]UR[facility] ORS;  Service: Neurosurgery;  Laterality: N/A;     OB History    Gravida  1   Para  1   Term  1   Preterm      AB      Living  1     SAB      IAB      Ectopic  Multiple      Live Births              Family History  Problem Relation Age of Onset  . Heart disease Mother   . Diabetes Mother   . Cancer Father        bladder  . Autism Brother   . Diabetes Brother   . Heart disease Brother   . Diabetes Brother   . Diabetes Brother     Social History   Tobacco Use  . Smoking status: Former Smoker    Types: Cigarettes  . Smokeless tobacco: Never Used  Vaping Use  . Vaping Use: Never used  Substance Use Topics  . Alcohol use: No  . Drug use: No    Home Medications Prior to Admission  medications   Medication Sig Start Date End Date Taking? Authorizing Provider  azithromycin (ZITHROMAX) 250 MG tablet Take 1 tablet (250 mg total) by mouth daily. Take first 2 tablets together, then 1 every day until finished. 09/01/20  Yes Taiesha Bovard, Almyra Free, PA-C  acetaminophen (TYLENOL) 325 MG tablet Take 650 mg by mouth every 6 (six) hours as needed for mild pain.    [provider]  citalopram (CELEXA) 40 MG tablet Take 1 tablet (40 mg total) by mouth daily. Schedule Physical 07/03/20   Janora Norlander, DO  hydrOXYzine (VISTARIL) 25 MG capsule Take 1 capsule (25 mg total) by mouth every 8 (eight) hours as needed for anxiety. 07/26/19   Terald Sleeper, PA-C  ibuprofen (ADVIL) 800 MG tablet Take 1 tablet (800 mg total) by mouth every 8 (eight) hours as needed for moderate pain. 01/18/20   Janora Norlander, DO  metoprolol succinate (TOPROL-XL) 50 MG 24 hr tablet TAKE ONE (1) TABLET EACH DAY 07/25/20   Arnoldo Lenis, MD  SUMAtriptan (IMITREX) 50 MG tablet TAKE 1 TABLET AT START OF HEADACHE REPEAT IN 4 HOURS IF NEEDED 02/22/20   Ronnie Doss M, DO    Allergies    Codeine, Latex, Norco [hydrocodone-acetaminophen], and Tape  Review of Systems   Review of Systems  Constitutional: Negative for chills and fever.  HENT: Negative for congestion and sore throat.   Eyes: Negative.   Respiratory: Positive for cough and shortness of breath. Negative for chest tightness.   Cardiovascular: Positive for chest pain and leg swelling.  Gastrointestinal: Negative for abdominal pain, nausea and vomiting.  Genitourinary: Negative.   Musculoskeletal: Negative for arthralgias, joint swelling and neck pain.  Skin: Negative.  Negative for rash and wound.  Neurological: Negative for dizziness, weakness, light-headedness, numbness and headaches.  Psychiatric/Behavioral: Negative.   All other systems reviewed and are negative.   Physical Exam Updated Vital Signs BP (!) 138/94 (BP Location: Left Arm)    Pulse 83   Temp 98.4 F (36.9 C) (Oral)   Resp 18   Ht 5\' 7"  (1.702 m)   Wt 117.9 kg   LMP 02/24/2013   SpO2 98%   BMI 40.72 kg/m   Physical Exam Vitals and nursing note reviewed.  Constitutional:      Appearance: She is well-developed.  HENT:     Head: Normocephalic and atraumatic.  Eyes:     Conjunctiva/sclera: Conjunctivae normal.  Cardiovascular:     Rate and Rhythm: Normal rate and regular rhythm.     Heart sounds: Normal heart sounds.  Pulmonary:     Effort: Pulmonary effort is normal.     Breath sounds: Normal breath sounds. No decreased breath sounds, wheezing, rhonchi  or rales.  Chest:     Chest wall: No tenderness.  Abdominal:     General: Bowel sounds are normal.     Palpations: Abdomen is soft.     Tenderness: There is no abdominal tenderness.  Musculoskeletal:        General: Normal range of motion.     Cervical back: Normal range of motion.     Right lower leg: No edema.     Left lower leg: No edema.     Comments: No pitting edema lower extremities.  No cords, no ttp.  Skin:    General: Skin is warm and dry.  Neurological:     General: No focal deficit present.     Mental Status: She is alert and oriented to person, place, and time.  Psychiatric:        Mood and Affect: Mood is anxious.     ED Results / Procedures / Treatments   Labs (all labs ordered are listed, but only abnormal results are displayed) Labs Reviewed  BASIC METABOLIC PANEL - Abnormal; Notable for the following components:      Result Value   Glucose, Bld 114 (*)    All other components within normal limits  D-DIMER, QUANTITATIVE - Abnormal; Notable for the following components:   D-Dimer, Quant 0.62 (*)    All other components within normal limits  CBC  BRAIN NATRIURETIC PEPTIDE  CBG MONITORING, ED  TROPONIN I (HIGH SENSITIVITY)  TROPONIN I (HIGH SENSITIVITY)    EKG EKG Interpretation  Date/Time:  Saturday September 01 2020 11:37:01 EDT Ventricular Rate:  80 PR  Interval:  149 QRS Duration: 71 QT Interval:  382 QTC Calculation: 441 R Axis:   47 Text Interpretation: Sinus rhythm Low voltage, precordial leads Confirmed by Noemi Chapel [id number]) on 09/01/2020 11:51:54 AM   Radiology CT Angio Chest PE W and/or Wo Contrast  Result Date: 09/01/2020 CLINICAL DATA:  Shortness of breath beginning 2 days ago. Dry cough. Intermittent swelling in legs. EXAM: CT ANGIOGRAPHY CHEST WITH CONTRAST TECHNIQUE: Multidetector CT imaging of the chest was performed using the standard protocol during bolus administration of intravenous contrast. Multiplanar CT image reconstructions and MIPs were obtained to evaluate the vascular anatomy. CONTRAST:  174mL OMNIPAQUE IOHEXOL 350 MG/ML SOLN COMPARISON:  September 01, 2020 FINDINGS: Cardiovascular: The thoracic aorta is unremarkable. The heart size is borderline. The coronary arteries are unremarkable on limited imaging. Central pulmonary arteries are normal in caliber. No pulmonary emboli. Mediastinum/Nodes: No enlarged mediastinal, hilar, or axillary lymph nodes. Thyroid gland, trachea, and esophagus demonstrate no significant findings. Lungs/Pleura: Central airways are normal. No pneumothorax. Mild opacity dependently in the right lung on series 6, image 83 and sagittal image 65 is identified. No other evidence of infiltrate. There is a tiny calcified nodule measuring 3 mm the lateral left lung base on series 6, image 68. No other nodules, masses, or infiltrates. Upper Abdomen: No acute abnormality. Musculoskeletal: No chest wall abnormality. No acute or significant osseous findings. Review of the MIP images confirms the above findings. IMPRESSION: 1. No pulmonary emboli. 2. Mild opacity dependently in the right lung base on series 6, image 83 and sagittal image 65. While this may represent atelectasis, an early infiltrate such as pneumonia is not excluded. 3. No other abnormalities. Electronically Signed   By: Dorise Bullion III M.D   On:  09/01/2020 14:30   DG Chest Portable 1 View  Result Date: 09/01/2020 CLINICAL DATA:  Provided history: Chest pain, shortness of breath. EXAM:  PORTABLE CHEST 1 VIEW COMPARISON:  August 23, 2010. FINDINGS: Mild apparent enlargement of the cardiac silhouette, which likely is accentuated by AP portable technique. Both lungs are clear. No visible pleural effusions or pneumothorax. No acute osseous abnormality. Sclerotic lesion in the right humeral head. IMPRESSION: 1. No evidence of acute cardiopulmonary disease. 2. Mild apparent enlargement of the cardiac silhouette, which likely is accentuated by AP portable technique. 3. Sclerotic lesion in the right humeral head, which appears new from remote prior. This most likely represents a benign chondroid lesion, but is incompletely characterized. Recommend non urgent shoulder radiographs to further evaluate. Electronically Signed   By: Margaretha Sheffield MD   On: 09/01/2020 12:40    Procedures Procedures   Medications Ordered in ED Medications  aspirin chewable tablet 324 mg (324 mg Oral Given 09/01/20 1243)  iohexol (OMNIPAQUE) 350 MG/ML injection 100 mL (100 mLs Intravenous Contrast Given 09/01/20 1353)    ED Course  I have reviewed the triage vital signs and the nursing notes.  Pertinent labs & imaging results that were available during my care of the patient were reviewed by me and considered in my medical decision making (see chart for details).    MDM Rules/Calculators/A&P                          Labs and imaging discussed with pt, no acute findings for PE or ACS, ekg stable.  CT suggesting early pneumonia which does explain pain and cough.  Will cover with zpack.  Discussed other home tx. She does have upcoming appt with her pcp for full physical exam, encouraged to discuss prn diuretic for intermittent peripheral edema.  No chf, no renal insufficiency as source of edema and no appreciable edema on todays exam.  Final Clinical Impression(s) / ED  Diagnoses Final diagnoses:  Community acquired pneumonia of right lower lobe of lung    Rx / DC Orders ED Discharge Orders         Ordered    azithromycin (ZITHROMAX) 250 MG tablet  Daily        09/01/20 1524           Evalee Jefferson, Hershal Coria 09/01/20 1558    Noemi Chapel, MD 09/07/20 1119

## 2020-10-01 ENCOUNTER — Other Ambulatory Visit: Payer: Self-pay

## 2020-10-01 ENCOUNTER — Ambulatory Visit (INDEPENDENT_AMBULATORY_CARE_PROVIDER_SITE_OTHER): Payer: Self-pay | Admitting: Family Medicine

## 2020-10-01 ENCOUNTER — Encounter: Payer: Self-pay | Admitting: Family Medicine

## 2020-10-01 ENCOUNTER — Ambulatory Visit (INDEPENDENT_AMBULATORY_CARE_PROVIDER_SITE_OTHER): Payer: Self-pay

## 2020-10-01 VITALS — BP 137/96 | HR 88 | Temp 98.0°F | Resp 20 | Ht 67.0 in | Wt 304.3 lb

## 2020-10-01 DIAGNOSIS — R0609 Other forms of dyspnea: Secondary | ICD-10-CM

## 2020-10-01 DIAGNOSIS — R600 Localized edema: Secondary | ICD-10-CM

## 2020-10-01 DIAGNOSIS — R0600 Dyspnea, unspecified: Secondary | ICD-10-CM

## 2020-10-01 DIAGNOSIS — G894 Chronic pain syndrome: Secondary | ICD-10-CM

## 2020-10-01 DIAGNOSIS — S32009K Unspecified fracture of unspecified lumbar vertebra, subsequent encounter for fracture with nonunion: Secondary | ICD-10-CM

## 2020-10-01 DIAGNOSIS — Z8701 Personal history of pneumonia (recurrent): Secondary | ICD-10-CM

## 2020-10-01 DIAGNOSIS — I10 Essential (primary) hypertension: Secondary | ICD-10-CM

## 2020-10-01 MED ORDER — ANORO ELLIPTA 62.5-25 MCG/INH IN AEPB
1.0000 | INHALATION_SPRAY | Freq: Every day | RESPIRATORY_TRACT | 0 refills | Status: DC
Start: 2020-10-01 — End: 2021-05-30

## 2020-10-01 MED ORDER — HYDROCHLOROTHIAZIDE 25 MG PO TABS: 12.5000 mg | ORAL_TABLET | Freq: Every day | ORAL | 3 refills | Status: DC

## 2020-10-01 MED ORDER — IBUPROFEN 800 MG PO TABS: 800.0000 mg | ORAL_TABLET | Freq: Three times a day (TID) | ORAL | 12 refills | Status: DC | PRN

## 2020-10-01 NOTE — Progress Notes (Signed)
Subjective: CC: Chronic pain syndrome, hypertension, recent pneumonia PCP: Janora Norlander, DO [id number] D Billard is a 45 y.o. female presenting to clinic today for:  1.  Chronic pain syndrome Patient with chronic pain syndrome.  She is treated with Motrin 800 mg 3 times daily as needed.  She continues to try and appeal disability is had quite a difficult time getting any type of insurance.  She is asking about cones orange card today.  2.  Hypertension/edema, recent pneumonia Patient compliant with Toprol-XL 50 mg daily.  She is closely monitored by Dr. Harl Bowie and has upcoming appointment.  She notes that as of late she has been having some bilateral pedal edema.  She brings a photograph of this today.  Symptoms are predominantly present in the evening time.  She is had some dyspnea on exertion but this seems to be exacerbated by recent pneumonia.  She feels like she cannot take a deep breath in.  Pneumonia was suggested on CT scan when she was being evaluated for PE given elevation in D-dimer.  Has albuterol on hand but has not been utilizing it much because it causes her to feel tremulous   ROS: Per HPI  Allergies  Allergen Reactions  . Codeine Itching and Rash  . Latex Itching and Rash  . Norco [Hydrocodone-Acetaminophen] Itching and Rash  . Tape Itching and Rash   Past Medical History:  Diagnosis Date  . Anxiety   . Chronic pain syndrome 11/25/2016  . DDD (degenerative disc disease)   . 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 Deep incisional surgical site infection 07/01/2015  . Diabetes mellitus without complication ([place])    borderline; diet controlled  . Essential hypertension 07/26/2019  . Family history of adverse reaction to anesthesia    Patients son has bad N/V  . GAD (generalized anxiety disorder) 06/26/2017  . Generalized edema 03/24/2017  . 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[place]) 08/21/2017  . 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  .  Migraine   . Palpitations 07/26/2019  . Panic attack 06/26/2017  . PONV (postoperative nausea and vomiting)   . 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 Staphylococcus aureus bacteremia with sepsis ([place])   . Wound infection 06/30/2015    Current Outpatient Medications:  .  acetaminophen (TYLENOL) 325 MG tablet, Take 650 mg by mouth every 6 (six) hours as needed for mild pain., Disp: , Rfl:  .  citalopram (CELEXA) 40 MG tablet, Take 1 tablet (40 mg total) by mouth daily. Schedule Physical, Disp: 90 tablet, Rfl: 3 .  hydrOXYzine (VISTARIL) 25 MG capsule, Take 1 capsule (25 mg total) by mouth every 8 (eight) hours as needed for anxiety., Disp: 60 capsule, Rfl: 5 .  ibuprofen (ADVIL) 800 MG tablet, Take 1 tablet (800 mg total) by mouth every 8 (eight) hours as needed for moderate pain., Disp: 90 tablet, Rfl: 12 .  metoprolol succinate (TOPROL-XL) 50 MG 24 hr tablet, TAKE ONE (1) TABLET EACH DAY, Disp: 90 tablet, Rfl: 3 .  SUMAtriptan (IMITREX) 50 MG tablet, TAKE 1 TABLET AT START OF HEADACHE REPEAT IN 4 HOURS IF NEEDED, Disp: 9 tablet, Rfl: 5 Social History   Socioeconomic History  . Marital status: Legally Separated    Spouse name: Not on file  . Number of children: Not on file  . Years of education: Not on file  . Highest education level: Not on file  Occupational History  . Not on file  Tobacco Use  . Smoking status:  Former Smoker    Types: Cigarettes  . Smokeless tobacco: Never Used  Vaping Use  . Vaping Use: Never used  Substance and Sexual Activity  . Alcohol use: No  . Drug use: No  . Sexual activity: Not on file  Other Topics Concern  . Not on file  Social History Narrative  . Not on file   Social Determinants of Health   Financial Resource Strain: Not on file  Food Insecurity: Not on file  Transportation Needs: Not on file  Physical Activity: Not on file  Stress: Not on file  Social Connections: Not on file  Intimate Partner Violence: Not on file   Family  History  Problem Relation Age of Onset  . Heart disease Mother   . Diabetes Mother   . Cancer Father        bladder  . Autism Brother   . Diabetes Brother   . Heart disease Brother   . Diabetes Brother   . Diabetes Brother     Objective: Office vital signs reviewed. Resp 20   Ht 5\' 7"  (1.702 m)   LMP 02/24/2013   BMI 40.72 kg/m   Physical Examination:  General: Awake, alert, obese, No acute distress HEENT: Normal; sclera white Cardio: regular rate and rhythm, S1S2 heard, no murmurs appreciated Pulm: Mildly reduced breath sounds in the right upper and lower lung fields.  No wheezes, rhonchi or rales.  Normal work of breathing on room air Extremities: warm, well perfused, No edema, cyanosis or clubbing; +2 pulses bilaterally MSK: Ambulating independently.  Assessment/ Plan: 45 y.o. female   Chronic pain syndrome - Plan: ibuprofen (ADVIL) 800 MG tablet  Pseudoarthrosis of lumbar spine - Plan: ibuprofen (ADVIL) 800 MG tablet  History of recent pneumonia - Plan: DG Chest 2 View  Dyspnea on exertion - Plan: umeclidinium-vilanterol (ANORO ELLIPTA) 62.5-25 MCG/INH AEPB  Hypertension, essential - Plan: hydrochlorothiazide (HYDRODIURIL) 25 MG tablet  Bilateral leg edema - Plan: hydrochlorothiazide (HYDRODIURIL) 25 MG tablet  Pain syndrome is chronic and stable.  Pursuing disability.  Unfortunately having quite a bit of difficulty obtaining any type of medications/appointment assistance.  Recently denied Medicaid  Chest x-ray obtained which shows improving right-sided infiltrate.  Suspect that she has a component of restrictive lung disease in the setting of morbid obesity plus or minus COPD that has not been diagnosed.  Ideally would like her to see a lung doctor but again lack of insurance is prohibitive financially for her.  Trial of Anoro.  Albuterol 15 minutes prior to physical activity as needed  Blood pressure not at goal.  Add hydrochlorothiazide.  Hopefully this will  help with leg edema as well.  Start at 12.5 mg daily.  If needed may advance to 25 mg daily.  Watch blood pressures.  No orders of the defined types were placed in this encounter.  No orders of the defined types were placed in this encounter.    Janora Norlander, DO [place] [phone]

## 2020-11-06 NOTE — Progress Notes (Signed)
CARDIOLOGY CONSULT NOTE       Patient ID: Cheryl Randall MRN: [id number] DOB/AGE: Jul 08, 1975 45 y.o.  Admit date: (Not on file) Referring Physician: Primary below Primary Physician: Cheryl Norlander, DO Primary Cardiologist: Cheryl Randall   HPI: 45 y.o. first seen 08/25/19 with syncope thought due to a vagal reaction at church. Noted some palpitations Monitor 09/21/19 no arrhythmias just short burst PSVT Started on Toprol  Echo 09/26/19 EF 50-55% no valve disease ETT 09/27/19 normal but only reached 78% PMHR She has had some atypical chest pain and family history of premature CAD.   Has 5 brothers and 2 sisters son Cheryl Randall has Asperger's but working at [employer] is up. Palpitations ok not worse She was seen in ER 09/01/20 for dry cough and dyspnea CTA done with  No PE ? Atelectasis vs early pneumonia Rx with Z pack [name] She has some dependant edema from her obesity       ROS All other systems reviewed and negative except as noted above  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   Deep incisional surgical site infection 07/01/2015   Diabetes mellitus without complication (HCC)    borderline; diet controlled   Essential hypertension 07/26/2019   Family history of adverse reaction to anesthesia    Patients son has bad N/V   GAD (generalized anxiety disorder) 06/26/2017   Generalized edema 03/24/2017   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[place]) 08/21/2017   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   Migraine    Palpitations 07/26/2019   Panic attack 06/26/2017   PONV (postoperative nausea and vomiting)    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   Staphylococcus aureus bacteremia with sepsis ([place])    Wound infection 06/30/2015    Family History  Problem Relation Age of Onset   Heart disease Mother    Diabetes Mother     Cancer Father        bladder   Autism Brother    Diabetes Brother    Heart disease Brother    Diabetes Brother    Diabetes Brother     Social History   Socioeconomic History   Marital status: Legally Separated    Spouse name: Not on file   Number of children: Not on file   Years of education: Not on file   Highest education level: Not on file  Occupational History   Not on file  Tobacco Use   Smoking status: Former Smoker    Types: Cigarettes   Smokeless tobacco: Never Used  Scientific laboratory technician Use: Never used  Substance and Sexual Activity   Alcohol use: No   Drug use: No   Sexual activity: Not on file  Other Topics Concern   Not on file  Social History Narrative   Not on file   Social Determinants of Health   Financial Resource Strain: Not on file  Food Insecurity: Not on file  Transportation Needs: Not on file  Physical Activity: Not on file  Stress: Not on file  Social Connections: Not on file  Intimate Partner Violence: Not on file    Past Surgical History:  Procedure Laterality Date   ABDOMINAL HYSTERECTOMY     BACK SURGERY     X 2   BREAST SURGERY Bilateral    reduction  CHOLECYSTECTOMY     LUMBAR LAMINECTOMY/DECOMPRESSION MICRODISCECTOMY N/A 06/30/2015   Procedure: wound exploration irrigation and debridement ,explantation of bone growth stimulator, placement of wound vac;  Surgeon: Cheryl Ny Ditty, MD;  Location: [facility];  Service: Neurosurgery;  Laterality: N/A;      Current Outpatient Medications:    acetaminophen (TYLENOL) 325 MG tablet, Take 650 mg by mouth every 6 (six) hours as needed for mild pain., Disp: , Rfl:    albuterol (VENTOLIN HFA) 108 (90 Base) MCG/ACT inhaler, Inhale into the lungs every 6 (six) hours as needed., Disp: , Rfl:    citalopram (CELEXA) 40 MG tablet, Take 1 tablet (40 mg total) by mouth daily. Schedule Physical, Disp: 90 tablet, Rfl: 3   hydrochlorothiazide (HYDRODIURIL) 25 MG tablet, Take 0.5-1 tablets  (12.5-25 mg total) by mouth daily. For BP and fluid, Disp: 90 tablet, Rfl: 3   hydrOXYzine (VISTARIL) 25 MG capsule, Take 1 capsule (25 mg total) by mouth every 8 (eight) hours as needed for anxiety., Disp: 60 capsule, Rfl: 5   ibuprofen (ADVIL) 800 MG tablet, Take 1 tablet (800 mg total) by mouth every 8 (eight) hours as needed for moderate pain., Disp: 90 tablet, Rfl: 12   metoprolol succinate (TOPROL-XL) 50 MG 24 hr tablet, TAKE ONE (1) TABLET EACH DAY, Disp: 90 tablet, Rfl: 3   SUMAtriptan (IMITREX) 50 MG tablet, TAKE 1 TABLET AT START OF HEADACHE REPEAT IN 4 HOURS IF NEEDED, Disp: 9 tablet, Rfl: 5   umeclidinium-vilanterol (ANORO ELLIPTA) 62.5-25 MCG/INH AEPB, Inhale 1 puff into the lungs daily., Disp: 28 each, Rfl: 0    Physical Exam: Last menstrual period 02/24/2013.    Affect appropriate Healthy:  appears stated age 45: normal Neck supple with no adenopathy JVP normal no bruits no thyromegaly Lungs clear with no wheezing and good diaphragmatic motion Heart:  S1/S2 no murmur, no rub, gallop or click PMI normal Abdomen: benighn, BS positve, no tenderness, no AAA no bruit.  No HSM or HJR Distal pulses intact with no bruits No edema Neuro non-focal Skin warm and dry No muscular weakness   Labs:   Lab Results  Component Value Date   WBC 5.0 09/01/2020   HGB 12.6 09/01/2020   HCT 39.5 09/01/2020   MCV 89.8 09/01/2020   PLT 200 09/01/2020   No results for input(s): NA, K, CL, CO2, BUN, CREATININE, CALCIUM, PROT, BILITOT, ALKPHOS, ALT, AST, GLUCOSE in the last 168 hours.  Invalid input(s): LABALBU Lab Results  Component Value Date   TROPONINI <0.03 01/13/2016    Lab Results  Component Value Date   CHOL 165 07/26/2019   CHOL 121 11/06/2017   Lab Results  Component Value Date   HDL 59 07/26/2019   HDL 48 11/06/2017   Lab Results  Component Value Date   LDLCALC 90 07/26/2019   LDLCALC 62 11/06/2017   Lab Results  Component Value Date   TRIG 87 07/26/2019    TRIG 53 11/06/2017   Lab Results  Component Value Date   CHOLHDL 2.8 07/26/2019   CHOLHDL 2.5 11/06/2017   No results found for: LDLDIRECT    Radiology: No results found.  EKG: 07/26/19 SR rate 84 normal    ASSESSMENT AND PLAN:   1. Chest pain; atypical normal ECG CRF;s include Family history and HTN had normal ETT 09/26/19 but sub max exercise Discussed utility of coronary calcium score to further risk stratify  she is agreeable   2.  HTN:  Well controlled.  Continue current  medications and low sodium Dash type diet.   Continue beta blocker   3.  Anxiety: continue Celexa f/u primary   4.  Palpitations :  Monitor with no significant arrhythmia on beta blocker  Toprol to 50 mg daily    5. Pre syncope: likely vagal see above no high risk family history , exam or ECG   6. Dyspnea:  seems functional from deconditioning and obesity recent CTA with no PE and Rx for ? Early Pneumonia with Z pack Lungs normal on exam today. Has had normal echo 09/26/19 observe   7. Edema:  dependant from obesity and varicosities f/u primary can consider PRN diuretic   F/U in a year  Coronary calcium score   Signed: Jenkins Rouge 11/06/2020, 11:56 AM

## 2020-11-12 ENCOUNTER — Ambulatory Visit (INDEPENDENT_AMBULATORY_CARE_PROVIDER_SITE_OTHER): Payer: Self-pay | Admitting: Cardiovascular Disease

## 2020-11-12 ENCOUNTER — Other Ambulatory Visit: Payer: Self-pay

## 2020-11-12 ENCOUNTER — Encounter: Payer: Self-pay | Admitting: Cardiovascular Disease

## 2020-11-12 VITALS — BP 116/76 | HR 78 | Ht 67.0 in | Wt 309.0 lb

## 2020-11-12 DIAGNOSIS — I10 Essential (primary) hypertension: Secondary | ICD-10-CM

## 2020-11-12 DIAGNOSIS — R002 Palpitations: Secondary | ICD-10-CM

## 2020-11-12 DIAGNOSIS — R079 Chest pain, unspecified: Secondary | ICD-10-CM

## 2020-11-12 DIAGNOSIS — Z8249 Family history of ischemic heart disease and other diseases of the circulatory system: Secondary | ICD-10-CM

## 2020-11-12 DIAGNOSIS — R609 Edema, unspecified: Secondary | ICD-10-CM

## 2020-11-12 NOTE — Patient Instructions (Signed)
Medication Instructions:  Your physician recommends that you continue on your current medications as directed. Please refer to the Current Medication list given to you today.  *If you need a refill on your cardiac medications before your next appointment, please call your pharmacy*   Lab Work: NONE   If you have labs (blood work) drawn today and your tests are completely normal, you will receive your results only by: [place] (if you have MyChart) OR A paper copy in the mail If you have any lab test that is abnormal or we need to change your treatment, we will call you to review the results.   Testing/Procedures: Ca Score CT    Follow-Up: At [facility], you and your health needs are our priority.  As part of our continuing mission to provide you with exceptional heart care, we have created designated Provider Care Teams.  These Care Teams include your primary Cardiologist (physician) and Advanced Practice Providers (APPs -  Physician Assistants and Nurse Practitioners) who all work together to provide you with the care you need, when you need it.  We recommend signing up for the patient portal called "MyChart".  Sign up information is provided on this After Visit Summary.  MyChart is used to connect with patients for Virtual Visits (Telemedicine).  Patients are able to view lab/test results, encounter notes, upcoming appointments, etc.  Non-urgent messages can be sent to your provider as well.   To learn more about what you can do with MyChart, go to [email or web address].    Your next appointment:   1 year(s)  The format for your next appointment:   In Person  Provider:   Jenkins Rouge, MD   Other Instructions Thank you for choosing [facility]!

## 2020-11-19 ENCOUNTER — Ambulatory Visit (HOSPITAL_COMMUNITY): Payer: MEDICAID

## 2020-12-05 ENCOUNTER — Ambulatory Visit (HOSPITAL_COMMUNITY): Payer: Self-pay | Attending: Cardiovascular Disease

## 2021-01-17 ENCOUNTER — Encounter: Payer: Self-pay | Admitting: Family Medicine

## 2021-01-17 ENCOUNTER — Ambulatory Visit (INDEPENDENT_AMBULATORY_CARE_PROVIDER_SITE_OTHER): Payer: Self-pay | Admitting: Family Medicine

## 2021-01-17 DIAGNOSIS — R519 Headache, unspecified: Secondary | ICD-10-CM

## 2021-01-17 DIAGNOSIS — J302 Other seasonal allergic rhinitis: Secondary | ICD-10-CM

## 2021-01-17 MED ORDER — CETIRIZINE HCL 10 MG PO TABS: 10.0000 mg | ORAL_TABLET | Freq: Every day | ORAL | 1 refills | Status: DC

## 2021-01-17 NOTE — Progress Notes (Signed)
   Virtual Visit  Note Due to COVID-19 pandemic this visit was conducted virtually. This visit type was conducted due to national recommendations for restrictions regarding the COVID-19 Pandemic (e.g. social distancing, sheltering in place) in an effort to limit this patient's exposure and mitigate transmission in our community. All issues noted in this document were discussed and addressed.  A physical exam was not performed with this format.  I connected with Cheryl Randall on 01/17/21 at 0929 by telephone and verified that I am speaking with the correct person using two identifiers. Cheryl Randall is currently located at home and no one is currently with her during the visit. The provider, Gwenlyn Perking, FNP is located in their office at time of visit.  I discussed the limitations, risks, security and privacy concerns of performing an evaluation and management service by telephone and the availability of in person appointments. I also discussed with the patient that there may be a patient responsible charge related to this service. The patient expressed understanding and agreed to proceed.  CC: headache  History and Present Illness:  HPI Cheryl Randall reports a headache for the last 4 days. She has a history of migraines. She has been taking immitrex with good relief of her headaches. She does not typically have this many headaches. She reports that the headaches feel like a pressure around her nose and under her cheeks. She does report that her ears and throat have been itchy. She also reports itchy, watery eyes. She denies changes in vision, weakness, shortness of breath, chest pain, nausea, vomiting, congestion, sore throat, ear pain, fever, runny nose, or chills.     ROS As per HPI.   Observations/Objective: Alert and oriented x 3. Able to speak in full sentences without difficulty.    Assessment and Plan: [name] was seen today for headache.  Diagnoses and all orders for this  visit:  Nonintractable headache, unspecified chronicity pattern, unspecified headache type Relieved by immitrex. No alarm signs. Also reports allergy symptoms: itchy ears, throat, watery eyes. Discussed this could be contributing to headaches.  Start zyrtec daily as below.   Seasonal allergies -     cetirizine (ZYRTEC ALLERGY) 10 MG tablet; Take 1 tablet (10 mg total) by mouth daily.    Follow Up Instructions: Return to office for new or worsening symptoms, or if symptoms persist.     I discussed the assessment and treatment plan with the patient. The patient was provided an opportunity to ask questions and all were answered. The patient agreed with the plan and demonstrated an understanding of the instructions.   The patient was advised to call back or seek an in-person evaluation if the symptoms worsen or if the condition fails to improve as anticipated.  The above assessment and management plan was discussed with the patient. The patient verbalized understanding of and has agreed to the management plan. Patient is aware to call the clinic if symptoms persist or worsen. Patient is aware when to return to the clinic for a follow-up visit. Patient educated on when it is appropriate to go to the emergency department.   Time call ended:  0940  I provided 11 minutes of  non face-to-face time during this encounter.    Gwenlyn Perking, FNP

## 2021-03-25 ENCOUNTER — Other Ambulatory Visit: Payer: Self-pay | Admitting: Family Medicine

## 2021-03-25 DIAGNOSIS — J302 Other seasonal allergic rhinitis: Secondary | ICD-10-CM

## 2021-03-26 ENCOUNTER — Ambulatory Visit (INDEPENDENT_AMBULATORY_CARE_PROVIDER_SITE_OTHER): Payer: Self-pay | Admitting: Nurse Practitioner

## 2021-03-26 ENCOUNTER — Encounter: Payer: Self-pay | Admitting: Nurse Practitioner

## 2021-03-26 DIAGNOSIS — J069 Acute upper respiratory infection, unspecified: Secondary | ICD-10-CM | POA: Insufficient documentation

## 2021-03-26 MED ORDER — SALINE SPRAY 0.65 % NA SOLN: 1.0000 | NASAL | 1 refills | Status: DC | PRN

## 2021-03-26 MED ORDER — GUAIFENESIN ER 600 MG PO TB12: 600.0000 mg | ORAL_TABLET | Freq: Two times a day (BID) | ORAL | 0 refills | Status: DC

## 2021-03-26 MED ORDER — PREDNISONE 10 MG (21) PO TBPK: ORAL_TABLET | ORAL | 0 refills | Status: DC

## 2021-03-26 NOTE — Progress Notes (Signed)
   Virtual Visit  Note Due to COVID-19 pandemic this visit was conducted virtually. This visit type was conducted due to national recommendations for restrictions regarding the COVID-19 Pandemic (e.g. social distancing, sheltering in place) in an effort to limit this patient's exposure and mitigate transmission in our community. All issues noted in this document were discussed and addressed.  A physical exam was not performed with this format.  I connected with Cheryl Randall on 03/26/21 at oh 9:40 AM by telephone and verified that I am speaking with the correct person using two identifiers. Cheryl Randall is currently located at home during visit. The provider, Ivy Lynn, NP is located in their office at time of visit.  I discussed the limitations, risks, security and privacy concerns of performing an evaluation and management service by telephone and the availability of in person appointments. I also discussed with the patient that there may be a patient responsible charge related to this service. The patient expressed understanding and agreed to proceed.   History and Present Illness:  URI  This is a recurrent problem. Episode onset: past 3-4 days. The problem has been unchanged. There has been no fever. Associated symptoms include congestion and coughing. Pertinent negatives include no sinus pain or sore throat. She has tried nothing for the symptoms.     Review of Systems  Constitutional:  Negative for chills and fever.  HENT:  Positive for congestion. Negative for sinus pain and sore throat.   Respiratory:  Positive for cough.   All other systems reviewed and are negative.   Observations/Objective: Televisit patient not in distress.  Assessment and Plan: Take meds as prescribed - Use a cool mist humidifier  -Use saline nose sprays frequently -Force fluids -For fever or aches or pains- take Tylenol or ibuprofen. -Mucinex for cough and congestion -Prednisone taper -If  symptoms do not improve, she may need to be COVID tested to rule this out Follow up with worsening unresolved symptoms   Follow Up Instructions: Follow-up with unresolved symptoms.    I discussed the assessment and treatment plan with the patient. The patient was provided an opportunity to ask questions and all were answered. The patient agreed with the plan and demonstrated an understanding of the instructions.   The patient was advised to call back or seek an in-person evaluation if the symptoms worsen or if the condition fails to improve as anticipated.  The above assessment and management plan was discussed with the patient. The patient verbalized understanding of and has agreed to the management plan. Patient is aware to call the clinic if symptoms persist or worsen. Patient is aware when to return to the clinic for a follow-up visit. Patient educated on when it is appropriate to go to the emergency department.   Time call ended: 9:50 AM  I provided 10 minutes of  non face-to-face time during this encounter.    Ivy Lynn, NP

## 2021-03-26 NOTE — Patient Instructions (Addendum)
Upper Respiratory Infection, Adult  An upper respiratory infection (URI) affects the nose, throat, and upper air passages. URIs are caused by germs (viruses). The most common type of URI is often called "the common cold."  Medicines cannot cure URIs, but you can do things at home to relieve your symptoms. URIs usually get better within 7-10 days.  Follow these instructions at home:  Activity  Rest as needed.  If you have a fever, stay home from work or school until your fever is gone, or until your doctor says you may return to work or school.  You should stay home until you cannot spread the infection anymore (you are not contagious).  Your doctor may have you wear a face mask so you have less risk of spreading the infection.  Relieving symptoms  Gargle with a salt-water mixture 3-4 times a day or as needed. To make a salt-water mixture, completely dissolve -1 tsp of salt in 1 cup of warm water.  Use a cool-mist humidifier to add moisture to the air. This can help you breathe more easily.  Eating and drinking    Drink enough fluid to keep your pee (urine) pale yellow.  Eat soups and other clear broths.  General instructions    Take over-the-counter and prescription medicines only as told by your doctor. These include cold medicines, fever reducers, and cough suppressants.  Do not use any products that contain nicotine or tobacco. These include cigarettes and e-cigarettes. If you need help quitting, ask your doctor.  Avoid being where people are smoking (avoid secondhand smoke).  Make sure you get regular shots and get the flu shot every year.  Keep all follow-up visits as told by your doctor. This is important.  How to avoid spreading infection to others    Wash your hands often with soap and water. If you do not have soap and water, use hand sanitizer.  Avoid touching your mouth, face, eyes, or nose.  Cough or sneeze into a tissue or your sleeve or elbow. Do not cough or sneeze into your hand or into the  air.  Contact a doctor if:  You are getting worse, not better.  You have any of these:  A fever.  Chills.  Brown or red mucus in your nose.  Yellow or brown fluid (discharge)coming from your nose.  Pain in your face, especially when you bend forward.  Swollen neck glands.  Pain with swallowing.  White areas in the back of your throat.  Get help right away if:  You have shortness of breath that gets worse.  You have very bad or constant:  Headache.  Ear pain.  Pain in your forehead, behind your eyes, and over your cheekbones (sinus pain).  Chest pain.  You have long-lasting (chronic) lung disease along with any of these:  Wheezing.  Long-lasting cough.  Coughing up blood.  A change in your usual mucus.  You have a stiff neck.  You have changes in your:  Vision.  Hearing.  Thinking.  Mood.  Summary  An upper respiratory infection (URI) is caused by a germ called a virus. The most common type of URI is often called "the common cold."  URIs usually get better within 7-10 days.  Take over-the-counter and prescription medicines only as told by your doctor.  This information is not intended to replace advice given to you by your health care provider. Make sure you discuss any questions you have with your health care provider.  Document 

## 2021-03-26 NOTE — Assessment & Plan Note (Signed)
Take meds as prescribed - Use a cool mist humidifier  -Use saline nose sprays frequently -Force fluids -For fever or aches or pains- take Tylenol or ibuprofen. -Mucinex for cough and congestion -Prednisone taper -If symptoms do not improve, she may need to be COVID tested to rule this out Follow up with worsening unresolved symptoms  Rx sent to pharmacy. Education provided to patient, patient verbalized understanding.

## 2021-04-23 ENCOUNTER — Ambulatory Visit (INDEPENDENT_AMBULATORY_CARE_PROVIDER_SITE_OTHER): Payer: Self-pay | Admitting: Family Medicine

## 2021-04-23 NOTE — Progress Notes (Signed)
Did not need appt. Waiting on ins

## 2021-04-24 ENCOUNTER — Encounter: Payer: Self-pay | Admitting: Family Medicine

## 2021-04-24 ENCOUNTER — Ambulatory Visit (INDEPENDENT_AMBULATORY_CARE_PROVIDER_SITE_OTHER): Payer: Self-pay | Admitting: Family Medicine

## 2021-04-24 DIAGNOSIS — R062 Wheezing: Secondary | ICD-10-CM

## 2021-04-24 DIAGNOSIS — J069 Acute upper respiratory infection, unspecified: Secondary | ICD-10-CM

## 2021-04-24 MED ORDER — PREDNISONE 20 MG PO TABS: ORAL_TABLET | ORAL | 0 refills | Status: DC

## 2021-04-24 NOTE — Progress Notes (Signed)
Virtual Visit via telephone Note Due to COVID-19 pandemic this visit was conducted virtually. This visit type was conducted due to national recommendations for restrictions regarding the COVID-19 Pandemic (e.g. social distancing, sheltering in place) in an effort to limit this patient's exposure and mitigate transmission in our community. All issues noted in this document were discussed and addressed.  A physical exam was not performed with this format.   I connected with Cheryl Randall on 04/24/2021 at 1215 by telephone and verified that I am speaking with the correct person using two identifiers. Cheryl Randall is currently located at home and family is currently with them during visit. The provider, Monia Pouch, FNP is located in their office at time of visit.  I discussed the limitations, risks, security and privacy concerns of performing an evaluation and management service by telephone and the availability of in person appointments. I also discussed with the patient that there may be a patient responsible charge related to this service. The patient expressed understanding and agreed to proceed.  Subjective:  Patient ID: Cheryl Randall, female    DOB: Oct 15, 1975, 45 y.o.   MRN: [id number]  Chief Complaint:  URI   HPI: Cheryl Randall is a 45 y.o. female presenting on 04/24/2021 for URI   URI  This is a new problem. The current episode started in the past 7 days. The problem has been waxing and waning. There has been no fever. Associated symptoms include congestion, coughing, a plugged ear sensation and wheezing. Pertinent negatives include no abdominal pain, chest pain, diarrhea, dysuria, ear pain, headaches, joint pain, joint swelling, nausea, neck pain, rash, rhinorrhea, sinus pain, sneezing, sore throat, swollen glands or vomiting. She has tried acetaminophen and decongestant for the symptoms. The treatment provided mild relief.    Relevant past medical, surgical, family, and social  history reviewed and updated as indicated.  Allergies and medications reviewed and updated.   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/2017   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   Migraine    Palpitations 07/26/2019   Panic attack 06/26/2017   PONV (postoperative nausea and vomiting)    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   Staphylococcus aureus bacteremia with sepsis ([place])    Wound infection 06/30/2015    Past Surgical History:  Procedure Laterality Date   ABDOMINAL HYSTERECTOMY     BACK SURGERY     X 2   BREAST SURGERY Bilateral    reduction   CHOLECYSTECTOMY     LUMBAR LAMINECTOMY/DECOMPRESSION MICRODISCECTOMY N/A 06/30/2015   Procedure: wound exploration irrigation and debridement ,explantation of bone growth stimulator, placement of wound vac;  Surgeon: Kevan Ny Ditty, MD;  Location: [facility];  Service: Neurosurgery;  Laterality: N/A;    Social History   Socioeconomic History   Marital status: Legally Separated    Spouse name: Not on file   Number of children: Not on file   Years of education: Not on file   Highest education  level: Not on file  Occupational History   Not on file  Tobacco Use   Smoking status: Former    Types: Cigarettes   Smokeless tobacco: Never  Vaping Use   Vaping Use: Never used  Substance and Sexual Activity   Alcohol use: No   Drug use: No   Sexual activity: Not on file  Other Topics Concern   Not on file  Social  History Narrative   Not on file   Social Determinants of Health   Financial Resource Strain: Not on file  Food Insecurity: Not on file  Transportation Needs: Not on file  Physical Activity: Not on file  Stress: Not on file  Social Connections: Not on file  Intimate Partner Violence: Not on file    Outpatient Encounter Medications as of 04/24/2021  Medication Sig   predniSONE (DELTASONE) 20 MG tablet 2 po at sametime daily for 5 days- start tomorrow   acetaminophen (TYLENOL) 325 MG tablet Take 650 mg by mouth every 6 (six) hours as needed for mild pain.   albuterol (VENTOLIN HFA) 108 (90 Base) MCG/ACT inhaler Inhale into the lungs every 6 (six) hours as needed.   cetirizine (ZYRTEC) 10 MG tablet TAKE ONE (1) TABLET BY MOUTH EVERY DAY   citalopram (CELEXA) 40 MG tablet Take 1 tablet (40 mg total) by mouth daily. Schedule Physical   guaiFENesin (MUCINEX) 600 MG 12 hr tablet Take 1 tablet (600 mg total) by mouth 2 (two) times daily.   hydrochlorothiazide (HYDRODIURIL) 25 MG tablet Take 0.5-1 tablets (12.5-25 mg total) by mouth daily. For BP and fluid   hydrOXYzine (VISTARIL) 25 MG capsule Take 1 capsule (25 mg total) by mouth every 8 (eight) hours as needed for anxiety.   ibuprofen (ADVIL) 800 MG tablet Take 1 tablet (800 mg total) by mouth every 8 (eight) hours as needed for moderate pain.   metoprolol succinate (TOPROL-XL) 50 MG 24 hr tablet TAKE ONE (1) TABLET EACH DAY   sodium chloride (OCEAN) 0.65 % SOLN nasal spray Place 1 spray into both nostrils as needed for congestion.   SUMAtriptan (IMITREX) 50 MG tablet TAKE 1 TABLET AT START OF HEADACHE REPEAT IN 4 HOURS IF NEEDED   umeclidinium-vilanterol (ANORO ELLIPTA) 62.5-25 MCG/INH AEPB Inhale 1 puff into the lungs daily.   No facility-administered encounter medications on file as of 04/24/2021.    Allergies  Allergen Reactions   Codeine Itching and Rash   Latex Itching and Rash   Norco [Hydrocodone-Acetaminophen] Itching and Rash    Tape Itching and Rash    Review of Systems  Constitutional:  Negative for activity change, appetite change, chills, diaphoresis, fatigue, fever and unexpected weight change.  HENT:  Positive for congestion. Negative for ear pain, rhinorrhea, sinus pain, sneezing and sore throat.   Respiratory:  Positive for cough and wheezing. Negative for apnea, choking, chest tightness, shortness of breath and stridor.   Cardiovascular:  Negative for chest pain.  Gastrointestinal:  Negative for abdominal pain, diarrhea, nausea and vomiting.  Genitourinary:  Negative for decreased urine volume and dysuria.  Musculoskeletal:  Negative for joint pain and neck pain.  Skin:  Negative for rash.  Neurological:  Negative for weakness and headaches.  Psychiatric/Behavioral:  Negative for confusion.   All other systems reviewed and are negative.       Observations/Objective: No vital signs or physical exam, this was a telephone or virtual health encounter.  Pt alert and oriented, answers all questions appropriately, and able to speak in full  sentences.    Assessment and Plan: Lincoln was seen today for uri.  Diagnoses and all orders for this visit:  URI with cough and congestion Wheezing No indications of acute bacterial infection.  Viral URI with cough, congestion, and minimal wheezing.  Patient to continue Albuterol inhaler, Mucinex, and Flonase at home.  We will add steroids as prescribed.  Patient aware to report any new, worsening, or persistent symptoms. -     predniSONE (DELTASONE) 20 MG tablet; 2 po at sametime daily for 5 days- start tomorrow    Follow Up Instructions: Return if symptoms worsen or fail to improve.    I discussed the assessment and treatment plan with the patient. The patient was provided an opportunity to ask questions and all were answered. The patient agreed with the plan and demonstrated an understanding of the instructions.   The patient was advised to call back or  seek an in-person evaluation if the symptoms worsen or if the condition fails to improve as anticipated.  The above assessment and management plan was discussed with the patient. The patient verbalized understanding of and has agreed to the management plan. Patient is aware to call the clinic if they develop any new symptoms or if symptoms persist or worsen. Patient is aware when to return to the clinic for a follow-up visit. Patient educated on when it is appropriate to go to the emergency department.    I provided 11 minutes of non-face-to-face time during this encounter. The call started at 1215. The call ended at 1225. The other time was used for coordination of care.    Monia Pouch, FNP-[facility] [address] [place], [place] [address] [phone] 04/24/2021

## 2021-04-26 ENCOUNTER — Emergency Department (HOSPITAL_COMMUNITY)
Admission: EM | Admit: 2021-04-26 | Discharge: 2021-04-26 | Disposition: A | Discharge status: Home / Self Care | Payer: Self-pay | Attending: Emergency Medicine | Admitting: Emergency Medicine

## 2021-04-26 ENCOUNTER — Other Ambulatory Visit: Payer: Self-pay

## 2021-04-26 ENCOUNTER — Emergency Department (HOSPITAL_COMMUNITY): Payer: Self-pay

## 2021-04-26 ENCOUNTER — Encounter (HOSPITAL_COMMUNITY): Payer: Self-pay

## 2021-04-26 DIAGNOSIS — I10 Essential (primary) hypertension: Secondary | ICD-10-CM | POA: Insufficient documentation

## 2021-04-26 DIAGNOSIS — Z79899 Other long term (current) drug therapy: Secondary | ICD-10-CM | POA: Insufficient documentation

## 2021-04-26 DIAGNOSIS — Z91040 Latex allergy status: Secondary | ICD-10-CM | POA: Insufficient documentation

## 2021-04-26 DIAGNOSIS — Z20822 Contact with and (suspected) exposure to covid-19: Secondary | ICD-10-CM | POA: Insufficient documentation

## 2021-04-26 DIAGNOSIS — Z87891 Personal history of nicotine dependence: Secondary | ICD-10-CM | POA: Insufficient documentation

## 2021-04-26 DIAGNOSIS — J3489 Other specified disorders of nose and nasal sinuses: Secondary | ICD-10-CM | POA: Insufficient documentation

## 2021-04-26 DIAGNOSIS — E119 Type 2 diabetes mellitus without complications: Secondary | ICD-10-CM | POA: Insufficient documentation

## 2021-04-26 DIAGNOSIS — J101 Influenza due to other identified influenza virus with other respiratory manifestations: Secondary | ICD-10-CM | POA: Insufficient documentation

## 2021-04-26 LAB — CBC WITH DIFFERENTIAL/PLATELET
Abs Immature Granulocytes: 0.07 10*3/uL (ref 0.00–0.07)
Basophils Absolute: 0 10*3/uL (ref 0.0–0.1)
Basophils Relative: 1 %
Eosinophils Absolute: 0 10*3/uL (ref 0.0–0.5)
Eosinophils Relative: 1 %
HCT: 41.7 % (ref 36.0–46.0)
Hemoglobin: 13.1 g/dL (ref 12.0–15.0)
Immature Granulocytes: 1 %
Lymphocytes Relative: 17 %
Lymphs Abs: 1.1 10*3/uL (ref 0.7–4.0)
MCH: 28.5 pg (ref 26.0–34.0)
MCHC: 31.4 g/dL (ref 30.0–36.0)
MCV: 90.8 fL (ref 80.0–100.0)
Monocytes Absolute: 0.7 10*3/uL (ref 0.1–1.0)
Monocytes Relative: 11 %
Neutro Abs: 4.4 10*3/uL (ref 1.7–7.7)
Neutrophils Relative %: 69 %
Platelets: 231 10*3/uL (ref 150–400)
RBC: 4.59 MIL/uL (ref 3.87–5.11)
RDW: 13.8 % (ref 11.5–15.5)
WBC: 6.3 10*3/uL (ref 4.0–10.5)
nRBC: 0 % (ref 0.0–0.2)

## 2021-04-26 LAB — BASIC METABOLIC PANEL
Anion gap: 10 (ref 5–15)
BUN: 13 mg/dL (ref 6–20)
CO2: 25 mmol/L (ref 22–32)
Calcium: 8.9 mg/dL (ref 8.9–10.3)
Chloride: 102 mmol/L (ref 98–111)
Creatinine, Ser: 0.87 mg/dL (ref 0.44–1.00)
GFR, Estimated: >60 mL/min (ref >60)
Glucose, Bld: 129 mg/dL — ABNORMAL HIGH (ref 70–99)
Potassium: 3.1 mmol/L — ABNORMAL LOW (ref 3.5–5.1)
Sodium: 137 mmol/L (ref 135–145)

## 2021-04-26 LAB — RESP PANEL BY RT-PCR (FLU A&B, COVID) ARPGX2
Influenza A by PCR: POSITIVE — AB
Influenza B by PCR: NEGATIVE
SARS Coronavirus 2 by RT PCR: NEGATIVE

## 2021-04-26 MED ORDER — OXYCODONE-ACETAMINOPHEN 5-325 MG PO TABS: 1.0000 | ORAL_TABLET | Freq: Four times a day (QID) | ORAL | 0 refills | Status: DC | PRN

## 2021-04-26 MED ORDER — POTASSIUM CHLORIDE CRYS ER 20 MEQ PO TBCR
40.0000 meq | EXTENDED_RELEASE_TABLET | Freq: Once | ORAL | Status: DC
  Filled 2021-04-26: qty 2

## 2021-04-26 MED ORDER — ALBUTEROL SULFATE HFA 108 (90 BASE) MCG/ACT IN AERS: 2.0000 | INHALATION_SPRAY | Freq: Once | RESPIRATORY_TRACT | Status: DC

## 2021-04-26 NOTE — ED Provider Notes (Signed)
[facility] Provider Note   CSN: [id number] Arrival date & time: 04/26/21  1003     History Chief Complaint  Patient presents with   Cough    Cheryl Randall is a 45 y.o. female with a history including anxiety, borderline diabetes, hypertension and IBS presenting with a 3-day history of flulike symptoms including generalized myalgia, headache, nasal congestion along with copious nasal drainage which is described as clear, watery in association with postnasal drip and a constant nonproductive cough.  She also endorses pain in her face along with aching teeth.  She states her worst symptom is the constant cough which has kept her awake, stating she has only slept 1 hour last night secondary to this symptom.  She denies shortness of breath, chest pain, nausea or vomiting, abdominal pain.  She is both COVID and flu vaccinated.  She denies any documented fevers.  She was seen by her PCP this week and was placed on prednisone, currently day 3 without improvement in her symptoms.  During that visit she was also having wheezing, therefore was placed on albuterol.  Additionally she is using Mucinex and Flonase, has also taken Tylenol without improvement in her symptoms.  The history is provided by the patient.      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[place]) 08/21/2017   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   Migraine    Palpitations 07/26/2019   Panic attack 06/26/2017   PONV  (postoperative nausea and vomiting)    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   Staphylococcus aureus bacteremia with sepsis ([place])    Wound infection 06/30/2015    Patient Active Problem List   Diagnosis Date Noted   Back pain with history of spinal surgery 01/18/2020   Essential hypertension 07/26/2019   Palpitations 07/26/2019   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/2017   Panic attack 06/26/2017   GAD (generalized anxiety disorder) 06/26/2017   Generalized edema 03/24/2017   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   Chronic pain syndrome 11/25/2016   DDD (degenerative disc disease), lumbosacral 11/24/2016   Deep incisional surgical site infection 07/01/2015   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    Past Surgical History:  Procedure Laterality Date   ABDOMINAL HYSTERECTOMY     BACK SURGERY     X 2   BREAST SURGERY Bilateral    reduction   CHOLECYSTECTOMY     LUMBAR LAMINECTOMY/DECOMPRESSION MICRODISCECTOMY N/A 06/30/2015   Procedure: wound exploration irrigation and debridement ,explantation of bone growth stimulator, placement of wound vac;  Surgeon: Kevan Ny Ditty, MD;  Location: [facility];  Service: Neurosurgery;  Laterality: N/A;     OB History     Gravida  1   Para  1   Term  1   Preterm      AB      Living  1      SAB      IAB      Ectopic  Multiple      Live Births              Family History  Problem Relation Age of Onset   Heart disease Mother    Diabetes Mother    Cancer Father        bladder   Autism Brother    Diabetes Brother    Heart disease Brother    Diabetes Brother    Diabetes Brother     Social History   Tobacco Use   Smoking status: Former    Types: Cigarettes   Smokeless tobacco: Never  Vaping Use   Vaping Use: Never used  Substance Use Topics   Alcohol use: No   Drug use: No    Home Medications Prior to Admission medications   Medication Sig Start Date End Date Taking?  Authorizing Provider  oxyCODONE-acetaminophen (PERCOCET/ROXICET) 5-325 MG tablet Take 1 tablet by mouth every 6 (six) hours as needed (cough suppression). 04/26/21  Yes Dencil Cayson, Almyra Free, PA-C  acetaminophen (TYLENOL) 325 MG tablet Take 650 mg by mouth every 6 (six) hours as needed for mild pain.    [provider]  albuterol (VENTOLIN HFA) 108 (90 Base) MCG/ACT inhaler Inhale into the lungs every 6 (six) hours as needed.    [provider]  cetirizine (ZYRTEC) 10 MG tablet TAKE ONE (1) TABLET BY MOUTH EVERY DAY 03/26/21   Gwenlyn Perking, FNP  citalopram (CELEXA) 40 MG tablet Take 1 tablet (40 mg total) by mouth daily. Schedule Physical 07/03/20   Ronnie Doss M, DO  guaiFENesin (MUCINEX) 600 MG 12 hr tablet Take 1 tablet (600 mg total) by mouth 2 (two) times daily. 03/26/21   Ivy Lynn, NP  hydrochlorothiazide (HYDRODIURIL) 25 MG tablet Take 0.5-1 tablets (12.5-25 mg total) by mouth daily. For BP and fluid 10/01/20   Ronnie Doss M, DO  hydrOXYzine (VISTARIL) 25 MG capsule Take 1 capsule (25 mg total) by mouth every 8 (eight) hours as needed for anxiety. 07/26/19   Terald Sleeper, PA-C  ibuprofen (ADVIL) 800 MG tablet Take 1 tablet (800 mg total) by mouth every 8 (eight) hours as needed for moderate pain. 10/01/20   Janora Norlander, DO  metoprolol succinate (TOPROL-XL) 50 MG 24 hr tablet TAKE ONE (1) TABLET EACH DAY 07/25/20   Arnoldo Lenis, MD  predniSONE (DELTASONE) 20 MG tablet 2 po at sametime daily for 5 days- start tomorrow 04/24/21   Baruch Gouty, FNP  sodium chloride (OCEAN) 0.65 % SOLN nasal spray Place 1 spray into both nostrils as needed for congestion. 03/26/21   Ivy Lynn, NP  SUMAtriptan (IMITREX) 50 MG tablet TAKE 1 TABLET AT START OF HEADACHE REPEAT IN 4 HOURS IF NEEDED 02/22/20   Ronnie Doss M, DO  umeclidinium-vilanterol (ANORO ELLIPTA) 62.5-25 MCG/INH AEPB Inhale 1 puff into the lungs daily. 10/01/20   Janora Norlander, DO     Allergies    Codeine, Latex, Norco [hydrocodone-acetaminophen], and Tape  Review of Systems   Review of Systems  Constitutional:  Negative for chills and fever.  HENT:  Positive for congestion, postnasal drip, rhinorrhea, sinus pressure and sneezing. Negative for ear pain, facial swelling, nosebleeds, sore throat, trouble swallowing and voice change.   Eyes:  Negative for discharge.  Respiratory:  Positive for cough. Negative for shortness of breath, wheezing and stridor.   Cardiovascular:  Negative for chest pain.  Gastrointestinal:  Negative for abdominal pain, nausea and vomiting.  Genitourinary: Negative.   Musculoskeletal:  Positive for myalgias.  Skin: Negative.   Neurological:  Positive for headaches.   Physical Exam Updated Vital Signs BP 131/74 (BP Location: Right Arm)   Pulse 73   Temp 98.2 F (36.8 C) (Oral)   Resp 18   Ht 5\' 7"  (1.702 m)   Wt 131.5 kg   LMP 02/24/2013   SpO2 100%   BMI 45.42 kg/m   Physical Exam Constitutional:      Appearance: She is well-developed.     Comments: Appears fatigued  HENT:     Head: Normocephalic and atraumatic.     Right Ear: Tympanic membrane and ear canal normal.     Left Ear: Tympanic membrane and ear canal normal.     Nose: Mucosal edema, congestion and rhinorrhea present.     Mouth/Throat:     Mouth: Mucous membranes are moist.     Pharynx: Oropharynx is clear. Uvula midline. No oropharyngeal exudate or posterior oropharyngeal erythema.     Tonsils: No tonsillar abscesses.  Eyes:     Conjunctiva/sclera: Conjunctivae normal.  Cardiovascular:     Rate and Rhythm: Normal rate.     Heart sounds: Normal heart sounds.  Pulmonary:     Effort: Pulmonary effort is normal. No respiratory distress.     Breath sounds: No wheezing, rhonchi or rales.     Comments: Frequent dry sounding cough Abdominal:     Palpations: Abdomen is soft.     Tenderness: There is no abdominal tenderness.  Musculoskeletal:        General:  Normal range of motion.     Cervical back: Neck supple.  Skin:    General: Skin is warm and dry.     Findings: No rash.  Neurological:     General: No focal deficit present.     Mental Status: She is alert and oriented to person, place, and time.    ED Results / Procedures / Treatments   Labs (all labs ordered are listed, but only abnormal results are displayed) Labs Reviewed  RESP PANEL BY RT-PCR (FLU A&B, COVID) ARPGX2 - Abnormal; Notable for the following components:      Result Value   Influenza A by PCR POSITIVE (*)    All other components within normal limits  BASIC METABOLIC PANEL - Abnormal; Notable for the following components:   Potassium 3.1 (*)    Glucose, Bld 129 (*)    All other components within normal limits  CBC WITH DIFFERENTIAL/PLATELET    EKG None  Radiology DG Chest 2 View  Result Date: 04/26/2021 CLINICAL DATA:  Cough, congestion, headache EXAM: CHEST - 2 VIEW COMPARISON:  10/01/2020 FINDINGS: The heart size and mediastinal contours are within normal limits. Both lungs are clear. The visualized skeletal structures are unremarkable. IMPRESSION: Negative. Electronically Signed   By: Rolm Baptise M.D.   On: 04/26/2021 10:49    Procedures Procedures   Medications Ordered in ED Medications  potassium chloride SA (KLOR-CON) CR tablet 40 mEq (has no administration in time range)    ED Course  I have reviewed the triage vital signs and the nursing notes.  Pertinent labs & imaging results that were available during my care of the patient were reviewed by me and considered in my medical decision making (see chart for details).    MDM Rules/Calculators/A&P                           Patient with influenza A.  Her  chest x-ray is clear.  She does not have any wheezing on today's exam.  She was however given an albuterol inhaler at her PCP visit this week, she was encouraged to continue using this medication every 4 hours if her wheezing returns or her  coughing will worsens.  We also discussed other home treatments for symptom relief including Zyrtec to help with the nasal drainage.  There is no purulent or bloody discharge, this is not a sinus infection.  She does have multiple allergies including codeine and hydrocodone.  She has tried [employer] which did not help her with her cough.  She has taken oxycodone in the past which she states she tolerates well.  This was prescribed for her to help her with her cough complaint.,  Caution advised regarding sedation.  Return precautions were outlined.  The patient appears reasonably screened and/or stabilized for discharge and I doubt any other medical condition or other [facility] requiring further screening, evaluation, or treatment in the ED at this time prior to discharge.  Final Clinical Impression(s) / ED Diagnoses Final diagnoses:  Influenza A    Rx / DC Orders ED Discharge Orders          Ordered    oxyCODONE-acetaminophen (PERCOCET/ROXICET) 5-325 MG tablet  Every 6 hours PRN        04/26/21 1246             Evalee Jefferson, PA-C 04/26/21 1250    Daleen Bo, MD 04/27/21 [phone]

## 2021-04-26 NOTE — ED Provider Notes (Signed)
Emergency Medicine Provider Triage Evaluation Note  Cheryl Randall , a 45 y.o. female  was evaluated in triage.  Pt complains of she has been ill for 3 days with nasal congestion, face pain, tooth ache, cough, and myalgia.  She has been previously vaccinated against both COVID and influenza.  She denies vomiting or diarrhea.  Review of Systems  Positive: URI symptoms Negative: No GI symptoms.  Physical Exam  BP 131/74 (BP Location: Right Arm)   Pulse 73   Temp 98.2 F (36.8 C) (Oral)   Resp 18   Ht 5\' 7"  (1.702 m)   Wt 131.5 kg   LMP 02/24/2013   SpO2 96%   BMI 45.42 kg/m  Gen:   Awake, no distress overweight Resp:  Normal effort, no wheezes, rales or rhonchi MSK:   Moves extremities without difficulty  Other:  Nontoxic appearance  Medical Decision Making  Medically screening exam initiated at 10:33 AM.  Appropriate orders placed.  Cheryl Randall was informed that the remainder of the evaluation will be completed by another provider, this initial triage assessment does not replace that evaluation, and the importance of remaining in the ED until their evaluation is complete.  URI, bronchitis, pneumonia, viral infection   Cheryl Bo, MD 04/26/21 1034

## 2021-04-26 NOTE — Discharge Instructions (Signed)
As discussed you have tested positive for the flu which does explain your multiple symptoms today.  Rest make sure you are drinking plenty of fluids.  I would continue your home medications, finish the prednisone you were prescribed this week, also use your albuterol inhaler 2 puffs every 4 hours which may also help slow down your coughing.  I have prescribed you oxycodone for stronger cough suppressant so that you can sleep.  Be aware that this medication will make you drowsy and you cannot drive within 4 hours of taking it.  I also suggest trying your home Zyrtec which may help slow up your nasal discharge.  There is no suggestion of a sinus infection at this time.

## 2021-04-26 NOTE — ED Triage Notes (Signed)
Pt presents to ED with complaints of cough, congestion, headache x 3 days. Pt was started on prednisone 3 days ago.

## 2021-04-29 ENCOUNTER — Telehealth: Payer: Self-pay | Admitting: Family Medicine

## 2021-04-29 DIAGNOSIS — J069 Acute upper respiratory infection, unspecified: Secondary | ICD-10-CM

## 2021-04-29 MED ORDER — PROMETHAZINE-DM 6.25-15 MG/5ML PO SYRP: 5.0000 mL | ORAL_SOLUTION | Freq: Four times a day (QID) | ORAL | 0 refills | Status: DC | PRN

## 2021-04-29 NOTE — Telephone Encounter (Signed)
Pt aware.

## 2021-04-29 NOTE — Telephone Encounter (Signed)
Patient saw Cheryl Randall on 04/24/21 for cough/congestion.  Ended up in the ER over the weekend at [facility] and they diagnosed her with the flu.  She cannot quit coughing. She is using her albuterol inhaler but no relief.  Can something be prescribed for her for the cough?  Tessalon pills do not help her at all.  Pharmacy [facility]

## 2021-04-29 NOTE — Telephone Encounter (Signed)
Sent in cough syrup for the patient

## 2021-05-03 ENCOUNTER — Telehealth: Payer: Self-pay | Admitting: Family Medicine

## 2021-05-03 ENCOUNTER — Other Ambulatory Visit: Payer: Self-pay | Admitting: Family Medicine

## 2021-05-03 ENCOUNTER — Ambulatory Visit (INDEPENDENT_AMBULATORY_CARE_PROVIDER_SITE_OTHER): Payer: Self-pay | Admitting: Nurse Practitioner

## 2021-05-03 ENCOUNTER — Encounter: Payer: Self-pay | Admitting: Nurse Practitioner

## 2021-05-03 DIAGNOSIS — F411 Generalized anxiety disorder: Secondary | ICD-10-CM

## 2021-05-03 DIAGNOSIS — R300 Dysuria: Secondary | ICD-10-CM | POA: Insufficient documentation

## 2021-05-03 LAB — URINALYSIS, ROUTINE W REFLEX MICROSCOPIC
Bilirubin, UA: NEGATIVE
Glucose, UA: NEGATIVE
Ketones, UA: NEGATIVE
Nitrite, UA: NEGATIVE
Specific Gravity, UA: >1.03 — ABNORMAL HIGH (ref 1.005–1.030)
Urobilinogen, Ur: 0.2 mg/dL (ref 0.2–1.0)
pH, UA: 6 (ref 5.0–7.5)

## 2021-05-03 LAB — MICROSCOPIC EXAMINATION
Renal Epithel, UA: NONE SEEN /hpf
WBC, UA: >30 /hpf — AB (ref 0–5)

## 2021-05-03 MED ORDER — PHENAZOPYRIDINE HCL 95 MG PO TABS: 95.0000 mg | ORAL_TABLET | Freq: Three times a day (TID) | ORAL | 0 refills | Status: DC | PRN

## 2021-05-03 MED ORDER — SULFAMETHOXAZOLE-TRIMETHOPRIM 800-160 MG PO TABS: 1.0000 | ORAL_TABLET | Freq: Two times a day (BID) | ORAL | 0 refills | Status: DC

## 2021-05-03 NOTE — Assessment & Plan Note (Signed)
Burning with urination. -Pyridium 95 mg tablet 3 times daily for urinary burning and pain. -Completed urinalysis results pending. -Increase hydration -Follow-up with worsening unresolved symptoms.

## 2021-05-03 NOTE — Progress Notes (Signed)
   Virtual Visit  Note Due to COVID-19 pandemic this visit was conducted virtually. This visit type was conducted due to national recommendations for restrictions regarding the COVID-19 Pandemic (e.g. social distancing, sheltering in place) in an effort to limit this patient's exposure and mitigate transmission in our community. All issues noted in this document were discussed and addressed.  A physical exam was not performed with this format.  I connected with Cheryl Randall on 05/03/21 at 10 AM by telephone and verified that I am speaking with the correct person using two identifiers. Cheryl Randall is currently located at home during visit. The provider, Ivy Lynn, NP is located in their office at time of visit.  I discussed the limitations, risks, security and privacy concerns of performing an evaluation and management service by telephone and the availability of in person appointments. I also discussed with the patient that there may be a patient responsible charge related to this service. The patient expressed understanding and agreed to proceed.   History and Present Illness:  Dysuria  This is a recurrent problem. Episode onset: In the past few days. The problem occurs every urination. The problem has been unchanged. The quality of the pain is described as burning. The pain is moderate. There has been no fever. Pertinent negatives include no chills, flank pain, hematuria, nausea or vomiting. She has tried nothing for the symptoms.     Review of Systems  Constitutional:  Negative for chills.  HENT: Negative.    Respiratory: Negative.    Cardiovascular: Negative.   Gastrointestinal:  Negative for nausea and vomiting.  Genitourinary:  Positive for dysuria. Negative for flank pain and hematuria.  Skin: Negative.  Negative for rash.  All other systems reviewed and are negative.   Observations/Objective: Televisit patient not in distress  Assessment and Plan: Burning with  urination. -Pyridium 95 mg tablet 3 times daily for urinary burning and pain. -Completed urinalysis results pending. -Increase hydration   Follow Up Instructions: Follow-up for worsening unresolved symptoms    I discussed the assessment and treatment plan with the patient. The patient was provided an opportunity to ask questions and all were answered. The patient agreed with the plan and demonstrated an understanding of the instructions.   The patient was advised to call back or seek an in-person evaluation if the symptoms worsen or if the condition fails to improve as anticipated.  The above assessment and management plan was discussed with the patient. The patient verbalized understanding of and has agreed to the management plan. Patient is aware to call the clinic if symptoms persist or worsen. Patient is aware when to return to the clinic for a follow-up visit. Patient educated on when it is appropriate to go to the emergency department.   Time call ended: 10:08 AM  I provided 8 minutes of  non face-to-face time during this encounter.    Ivy Lynn, NP

## 2021-05-03 NOTE — Addendum Note (Signed)
Addended by: Ivy Lynn on: 05/03/2021 04:26 PM   Modules accepted: Orders

## 2021-05-05 ENCOUNTER — Other Ambulatory Visit: Payer: Self-pay | Admitting: Nurse Practitioner

## 2021-05-05 DIAGNOSIS — R8290 Unspecified abnormal findings in urine: Secondary | ICD-10-CM

## 2021-05-05 MED ORDER — NITROFURANTOIN MONOHYD MACRO 100 MG PO CAPS: 100.0000 mg | ORAL_CAPSULE | Freq: Two times a day (BID) | ORAL | 0 refills | Status: DC

## 2021-05-06 NOTE — Telephone Encounter (Signed)
Patient states she was sent in [place] yesterday and wants to verify which antibiotic she needs to continue with the Bactrim or does she need to pick up the macrobid? Patient aware culture is still pending.

## 2021-05-08 ENCOUNTER — Other Ambulatory Visit: Payer: Self-pay | Admitting: Family Medicine

## 2021-05-08 DIAGNOSIS — G43809 Other migraine, not intractable, without status migrainosus: Secondary | ICD-10-CM

## 2021-05-11 LAB — CULTURE, URINE COMPREHENSIVE

## 2021-05-15 ENCOUNTER — Encounter: Payer: Self-pay | Admitting: Nurse Practitioner

## 2021-05-15 ENCOUNTER — Ambulatory Visit (INDEPENDENT_AMBULATORY_CARE_PROVIDER_SITE_OTHER): Payer: Self-pay | Admitting: Nurse Practitioner

## 2021-05-15 DIAGNOSIS — J069 Acute upper respiratory infection, unspecified: Secondary | ICD-10-CM

## 2021-05-15 DIAGNOSIS — R062 Wheezing: Secondary | ICD-10-CM

## 2021-05-15 MED ORDER — PSEUDOEPH-BROMPHEN-DM 30-2-10 MG/5ML PO SYRP: 5.0000 mL | ORAL_SOLUTION | Freq: Four times a day (QID) | ORAL | 0 refills | Status: DC | PRN

## 2021-05-15 MED ORDER — PREDNISONE 10 MG (21) PO TBPK: ORAL_TABLET | ORAL | 0 refills | Status: DC

## 2021-05-15 NOTE — Progress Notes (Signed)
° °  Virtual Visit  Note Due to COVID-19 pandemic this visit was conducted virtually. This visit type was conducted due to national recommendations for restrictions regarding the COVID-19 Pandemic (e.g. social distancing, sheltering in place) in an effort to limit this patient's exposure and mitigate transmission in our community. All issues noted in this document were discussed and addressed.  A physical exam was not performed with this format.  I connected with Cheryl Randall on 05/15/21 at 11:30 AM by telephone and verified that I am speaking with the correct person using two identifiers. Cheryl Randall is currently located at home during visit. The provider, Ivy Lynn, NP is located in their office at time of visit.  I discussed the limitations, risks, security and privacy concerns of performing an evaluation and management service by telephone and the availability of in person appointments. I also discussed with the patient that there may be a patient responsible charge related to this service. The patient expressed understanding and agreed to proceed.   History and Present Illness:  URI  This is a recurrent problem. The current episode started in the past 7 days. The problem has been unchanged. There has been no fever. Associated symptoms include congestion, coughing and headaches. Pertinent negatives include no ear pain, rash, sinus pain, sore throat or swollen glands. She has tried nothing for the symptoms.     Review of Systems  Constitutional:  Negative for chills, fever and malaise/fatigue.  HENT:  Positive for congestion. Negative for ear pain, sinus pain and sore throat.   Respiratory:  Positive for cough.   Skin:  Negative for rash.  Neurological:  Positive for headaches.  All other systems reviewed and are negative.   Observations/Objective: Televisit patient not in distress.  Assessment and Plan: Take meds as prescribed - Use a cool mist humidifier  -Use saline nose  sprays frequently -Force fluids -For fever or aches or pains- take Tylenol or ibuprofen. -Bromfed 5 mL by mouth for cold symptoms, cough and congestion -Prednisone taper. -If symptoms do not improve, she may need to be COVID tested to rule this out Follow up with worsening unresolved symptoms   Follow Up Instructions: Follow-up with unresolved symptoms.    I discussed the assessment and treatment plan with the patient. The patient was provided an opportunity to ask questions and all were answered. The patient agreed with the plan and demonstrated an understanding of the instructions.   The patient was advised to call back or seek an in-person evaluation if the symptoms worsen or if the condition fails to improve as anticipated.  The above assessment and management plan was discussed with the patient. The patient verbalized understanding of and has agreed to the management plan. Patient is aware to call the clinic if symptoms persist or worsen. Patient is aware when to return to the clinic for a follow-up visit. Patient educated on when it is appropriate to go to the emergency department.   Time call ended: 11:40 AM  I provided 10 minutes of  non face-to-face time during this encounter.    Ivy Lynn, NP

## 2021-05-15 NOTE — Assessment & Plan Note (Signed)
Take meds as prescribed - Use a cool mist humidifier  -Use saline nose sprays frequently -Force fluids -For fever or aches or pains- take Tylenol or ibuprofen. -Bromfed 5 mL by mouth for cold symptoms, cough and congestion -Prednisone taper. -If symptoms do not improve, she may need to be COVID tested to rule this out Follow up with worsening unresolved symptoms

## 2021-05-17 ENCOUNTER — Other Ambulatory Visit: Payer: Self-pay | Admitting: Family Medicine

## 2021-05-17 ENCOUNTER — Telehealth: Payer: Self-pay | Admitting: Family Medicine

## 2021-05-17 DIAGNOSIS — B9689 Other specified bacterial agents as the cause of diseases classified elsewhere: Secondary | ICD-10-CM

## 2021-05-17 MED ORDER — AZITHROMYCIN 250 MG PO TABS: ORAL_TABLET | ORAL | 0 refills | Status: DC

## 2021-05-17 NOTE — Telephone Encounter (Signed)
Patient aware and verbalized understanding. °

## 2021-05-17 NOTE — Telephone Encounter (Signed)
Zpak sent °

## 2021-05-17 NOTE — Telephone Encounter (Signed)
Pt called stating that she recently had televisit with [facility] and was prescribed some medicine to take and was told that if she didn't feel better in a few days to call the office and let us know and something else could be called in for her.  Pt says she does not feel any better and needs some different medicine.   Can covering provider advise on this.

## 2021-05-22 ENCOUNTER — Encounter: Payer: Self-pay | Admitting: Family Medicine

## 2021-05-22 ENCOUNTER — Ambulatory Visit (INDEPENDENT_AMBULATORY_CARE_PROVIDER_SITE_OTHER): Payer: Self-pay | Admitting: Family Medicine

## 2021-05-22 DIAGNOSIS — R053 Chronic cough: Secondary | ICD-10-CM

## 2021-05-22 DIAGNOSIS — R062 Wheezing: Secondary | ICD-10-CM

## 2021-05-22 MED ORDER — MONTELUKAST SODIUM 10 MG PO TABS: 10.0000 mg | ORAL_TABLET | Freq: Every day | ORAL | 3 refills | Status: DC

## 2021-05-22 NOTE — Progress Notes (Signed)
Virtual Visit via telephone note Due to COVID-19 pandemic this visit was conducted virtually. This visit type was conducted due to national recommendations for restrictions regarding the COVID-19 Pandemic (e.g. social distancing, sheltering in place) in an effort to limit this patient's exposure and mitigate transmission in our community. All issues noted in this document were discussed and addressed.  A physical exam was not performed with this format.   I connected with Cheryl Randall on 05/22/2021 at 1315 by telephone and verified that I am speaking with the correct person using two identifiers. Cheryl Randall is currently located at home and patient is currently with them during visit. The provider, Monia Pouch, FNP is located in their office at time of visit.  I discussed the limitations, risks, security and privacy concerns of performing an evaluation and management service by virtual visit and the availability of in person appointments. I also discussed with the patient that there may be a patient responsible charge related to this service. The patient expressed understanding and agreed to proceed.  Subjective:  Patient ID: Cheryl Randall, female    DOB: 1976/01/01, 45 y.o.   MRN: [id number]  Chief Complaint:  Cough   HPI: Cheryl Randall is a 45 y.o. female presenting on 05/22/2021 for Cough   Patient reports ongoing cough, congestion, and wheezing for over a month.  She was treated for influenza.  She has been on 2 rounds of antibiotics and multiple cough suppressants without relief of symptoms.  She states she is not able to cough anything up.  States the cough is interrupting her sleep.  She denies fever or chills.  No chest pain or shortness of breath.  She states the cough is ongoing and she is concerned she may have asthma.  Cough This is a recurrent problem. The current episode started more than 1 month ago. The problem has been waxing and waning. The problem occurs constantly.  The cough is Non-productive. Associated symptoms include nasal congestion, postnasal drip and wheezing. Pertinent negatives include no chest pain, chills, ear congestion, ear pain, fever, headaches, heartburn, hemoptysis, myalgias, rash, rhinorrhea, sore throat, shortness of breath, sweats or weight loss. Nothing aggravates the symptoms. She has tried a beta-agonist inhaler, oral steroids, OTC cough suppressant, prescription cough suppressant and rest for the symptoms. The treatment provided no relief.    Relevant past medical, surgical, family, and social history reviewed and updated as indicated.  Allergies and medications reviewed and updated.   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   Migraine    Palpitations 07/26/2019   Panic attack 06/26/2017   PONV (postoperative nausea and vomiting)    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   Staphylococcus aureus bacteremia with sepsis ([place])    Wound infection 06/30/2015    Past Surgical History:  Procedure Laterality Date   ABDOMINAL HYSTERECTOMY     BACK SURGERY     X 2   BREAST SURGERY  Bilateral    reduction   CHOLECYSTECTOMY     LUMBAR LAMINECTOMY/DECOMPRESSION MICRODISCECTOMY N/A 06/30/2015   Procedure: wound exploration irrigation and debridement ,explantation of bone growth stimulator, placement of wound vac;  Surgeon: Kevan Ny Ditty, MD;  Location: [facility];  Service:  Neurosurgery;  Laterality: N/A;    Social History   Socioeconomic History   Marital status: Divorced    Spouse name: Not on file   Number of children: Not on file   Years of education: Not on file   Highest education level: Not on file  Occupational History   Not on file  Tobacco Use   Smoking status: Former    Types: Cigarettes   Smokeless tobacco: Never  Vaping Use   Vaping Use: Never used  Substance and Sexual Activity   Alcohol use: No   Drug use: No   Sexual activity: Not on file  Other Topics Concern   Not on file  Social History Narrative   Not on file   Social Determinants of Health   Financial Resource Strain: Not on file  Food Insecurity: Not on file  Transportation Needs: Not on file  Physical Activity: Not on file  Stress: Not on file  Social Connections: Not on file  Intimate Partner Violence: Not on file    Outpatient Encounter Medications as of 05/22/2021  Medication Sig   montelukast (SINGULAIR) 10 MG tablet Take 1 tablet (10 mg total) by mouth at bedtime.   acetaminophen (TYLENOL) 325 MG tablet Take 650 mg by mouth every 6 (six) hours as needed for mild pain.   albuterol (VENTOLIN HFA) 108 (90 Base) MCG/ACT inhaler Inhale into the lungs every 6 (six) hours as needed.   azithromycin (ZITHROMAX) 250 MG tablet Take 2 tablets today, then take 1 tablet daily until gone.   cetirizine (ZYRTEC) 10 MG tablet TAKE ONE (1) TABLET BY MOUTH EVERY DAY   citalopram (CELEXA) 40 MG tablet Take 1 tablet (40 mg total) by mouth daily. (NEEDS TO BE SEEN BEFORE NEXT REFILL)   guaiFENesin (MUCINEX) 600 MG 12 hr tablet Take 1 tablet (600 mg total) by mouth 2 (two) times daily.   hydrochlorothiazide (HYDRODIURIL) 25 MG tablet Take 0.5-1 tablets (12.5-25 mg total) by mouth daily. For BP and fluid   hydrOXYzine (VISTARIL) 25 MG capsule Take 1 capsule (25 mg total) by mouth every 8 (eight) hours as needed for anxiety.   ibuprofen (ADVIL) 800 MG tablet Take 1 tablet (800 mg  total) by mouth every 8 (eight) hours as needed for moderate pain.   metoprolol succinate (TOPROL-XL) 50 MG 24 hr tablet TAKE ONE (1) TABLET EACH DAY   oxyCODONE-acetaminophen (PERCOCET/ROXICET) 5-325 MG tablet Take 1 tablet by mouth every 6 (six) hours as needed (cough suppression).   phenazopyridine (PYRIDIUM) 95 MG tablet Take 1 tablet (95 mg total) by mouth 3 (three) times daily as needed for pain.   sodium chloride (OCEAN) 0.65 % SOLN nasal spray Place 1 spray into both nostrils as needed for congestion.   SUMAtriptan (IMITREX) 50 MG tablet TAKE 1 TABLET AT START OF HEADACHE MAY REPEAT IN 4 HOURS IF NEEDED   umeclidinium-vilanterol (ANORO ELLIPTA) 62.5-25 MCG/INH AEPB Inhale 1 puff into the lungs daily.   [DISCONTINUED] brompheniramine-pseudoephedrine-DM 30-2-10 MG/5ML syrup Take 5 mLs by mouth 4 (four) times daily as needed.   [DISCONTINUED] predniSONE (STERAPRED UNI-PAK 21 TAB) 10 MG (21) TBPK tablet 6 tablets day 1, 5 tablet day 2, 4 tablet day 3, 3 tablet day 4,  2 tablet day 5, 1 tablet day 6   [DISCONTINUED] promethazine-dextromethorphan (PROMETHAZINE-DM) 6.25-15 MG/5ML syrup Take 5 mLs by mouth 4 (four) times daily as needed for cough.   No facility-administered encounter medications on file as of 05/22/2021.    Allergies  Allergen Reactions   Codeine Itching and Rash   Latex Itching and Rash   Norco [Hydrocodone-Acetaminophen] Itching and Rash   Tape Itching and Rash    Review of Systems  Constitutional:  Negative for activity change, appetite change, chills, diaphoresis, fatigue, fever, unexpected weight change and weight loss.  HENT:  Positive for congestion and postnasal drip. Negative for ear pain, rhinorrhea and sore throat.   Respiratory:  Positive for cough and wheezing. Negative for hemoptysis and shortness of breath.   Cardiovascular:  Negative for chest pain, palpitations and leg swelling.  Gastrointestinal:  Negative for heartburn.  Genitourinary:  Negative for  decreased urine volume.  Musculoskeletal:  Negative for myalgias.  Skin:  Negative for rash.  Neurological:  Negative for weakness and headaches.  Psychiatric/Behavioral:  Positive for sleep disturbance. Negative for confusion.   All other systems reviewed and are negative.       Observations/Objective: No vital signs or physical exam, this was a virtual health encounter.  Pt alert and oriented, answers all questions appropriately, and able to speak in full sentences.    Assessment and Plan: Cheryl Randall was seen today for cough.  Diagnoses and all orders for this visit:  Chronic cough Wheezing Ongoing cough, congestion, and wheezing for over a month.  Has failed antibiotic therapy, prescription cough suppressant therapy.  Will trial Singulair nightly.  Patient aware to continue Mucinex and albuterol inhaler.  Due to ongoing symptoms, will refer to pulmonology for evaluation and treatment.  -     montelukast (SINGULAIR) 10 MG tablet; Take 1 tablet (10 mg total) by mouth at bedtime. -     Ambulatory referral to Pulmonology     Follow Up Instructions: Return if symptoms worsen or fail to improve.    I discussed the assessment and treatment plan with the patient. The patient was provided an opportunity to ask questions and all were answered. The patient agreed with the plan and demonstrated an understanding of the instructions.   The patient was advised to call back or seek an in-person evaluation if the symptoms worsen or if the condition fails to improve as anticipated.  The above assessment and management plan was discussed with the patient. The patient verbalized understanding of and has agreed to the management plan. Patient is aware to call the clinic if they develop any new symptoms or if symptoms persist or worsen. Patient is aware when to return to the clinic for a follow-up visit. Patient educated on when it is appropriate to go to the emergency department.    I provided 15  minutes of time during telephone encounter.   Monia Pouch, FNP-C [facility] [address] [place], [place] [address] [phone] 05/22/2021

## 2021-05-28 ENCOUNTER — Telehealth: Payer: Self-pay | Admitting: Family Medicine

## 2021-05-28 MED ORDER — PROMETHAZINE-DM 6.25-15 MG/5ML PO SYRP: 5.0000 mL | ORAL_SOLUTION | Freq: Four times a day (QID) | ORAL | 0 refills | Status: DC | PRN

## 2021-05-28 NOTE — Telephone Encounter (Signed)
I spoke with Monia Pouch and she gave the verbal ok to send over refill on cough syrup. Rx sent per [facility] and left message advising rx sent in and to call back with any further questions.

## 2021-05-28 NOTE — Telephone Encounter (Signed)
Pt called stating that she needs refill on cough medicine that was recently called in for her. Says the cough medicine helps her to not cough as much but says she still has a cough and is waiting on referral to see Pulmonologist.   Please advise and call patient.

## 2021-05-28 NOTE — Telephone Encounter (Signed)
I spoke with pt and she is doing the Singulair, mucinex and inhaler but states the Promethazine DM 6.25-15mg /76ml is the only thing that helps her get any rest at night. Can this be refilled while she is waiting on the pulmonology appt?

## 2021-05-30 ENCOUNTER — Encounter: Payer: Self-pay | Admitting: Internal Medicine

## 2021-05-30 ENCOUNTER — Ambulatory Visit (INDEPENDENT_AMBULATORY_CARE_PROVIDER_SITE_OTHER): Payer: Self-pay | Admitting: Internal Medicine

## 2021-05-30 ENCOUNTER — Other Ambulatory Visit: Payer: Self-pay

## 2021-05-30 ENCOUNTER — Ambulatory Visit (HOSPITAL_COMMUNITY)
Admission: RE | Admit: 2021-05-30 | Discharge: 2021-05-30 | Disposition: A | Discharge status: Home / Self Care | Payer: Self-pay | Source: Ambulatory Visit | Attending: Internal Medicine | Admitting: Internal Medicine

## 2021-05-30 DIAGNOSIS — R058 Other specified cough: Secondary | ICD-10-CM

## 2021-05-30 MED ORDER — FAMOTIDINE 20 MG PO TABS
ORAL_TABLET | ORAL | 11 refills | Status: DC
Start: 2021-05-30 — End: 2021-12-09

## 2021-05-30 MED ORDER — PANTOPRAZOLE SODIUM 40 MG PO TBEC: 40.0000 mg | DELAYED_RELEASE_TABLET | Freq: Every day | ORAL | 2 refills | Status: DC

## 2021-05-30 NOTE — Progress Notes (Signed)
Cheryl Randall, female    DOB: 16-Jun-1975,   MRN: [id number]   Brief patient profile:  85 yowf  quit smoking around 2016 with intermittent "croupy cough" as child mostly in winter  referred to pulmonary clinic in [place]  05/30/2021 by [place]  for refractory daily cough worse  x 2021 where previously was intermittent.     History of Present Illness  05/30/2021  Pulmonary/ 1st office eval/ Koya Hunger / [facility]  Chief Complaint  Patient presents with   Consult    Chronic cough and wheezing. States when she has a coughing fit she gets SOB   Dyspnea:  fine if not coughing  Cough: dry hack p stirs/ also at hs and with talking / can't sing anymore Sleep: flat bed 2 pillows  SABA use: makes it worse / no better with prednisone   No obvious day to day or daytime variability or assoc excess/ purulent sputum or mucus plugs or hemoptysis or cp or chest tightness,  overt sinus or hb symptoms.    Also denies any obvious fluctuation of symptoms with weather or environmental changes or other aggravating or alleviating factors except as outlined above   No unusual exposure hx or h/o childhood pna/ asthma or knowledge of premature birth.  Current Allergies, Complete Past Medical History, Past Surgical History, Family History, and Social History were reviewed in [employer] record.  ROS  The following are not active complaints unless bolded Hoarseness, sore throat, dysphagia, dental problems, itching, sneezing,  nasal congestion or discharge of excess mucus or purulent secretions, ear ache,   fever, chills, sweats, unintended wt loss or wt gain, classically pleuritic or exertional cp,  orthopnea pnd or arm/hand swelling  or leg swelling, presyncope, palpitations, abdominal pain, anorexia, nausea, vomiting, diarrhea  or change in bowel habits or change in bladder habits, change in stools or change in urine, dysuria, hematuria,  rash, arthralgias,  visual complaints, headache, numbness, weakness or ataxia or problems with walking or coordination,  change in mood or  memory.               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     albuterol (VENTOLIN HFA) 108 (90 Base) MCG/ACT inhaler Inhale into the lungs every 6 (six) hours as needed.     azithromycin (ZITHROMAX) 250 MG tablet Take 2 tablets today, then take 1 tablet daily until gone. 6 tablet 0   cetirizine (ZYRTEC) 10 MG tablet TAKE  ONE (1) TABLET BY MOUTH EVERY DAY 30 tablet 5   citalopram (CELEXA) 40 MG tablet Take 1 tablet (40 mg total) by mouth daily. (NEEDS TO BE SEEN BEFORE NEXT REFILL) 30 tablet 0   guaiFENesin (MUCINEX) 600 MG 12 hr tablet Take 1 tablet (600 mg total) by mouth 2 (two) times daily. 30 tablet 0   hydrochlorothiazide (HYDRODIURIL) 25 MG tablet Take 0.5-1 tablets (12.5-25 mg total) by mouth daily. For BP and fluid 90 tablet 3   hydrOXYzine (VISTARIL) 25 MG capsule Take 1 capsule (25 mg total) by  mouth every 8 (eight) hours as needed for anxiety. 60 capsule 5   ibuprofen (ADVIL) 800 MG tablet Take 1 tablet (800 mg total) by mouth every 8 (eight) hours as needed for moderate pain. 90 tablet 12   metoprolol succinate (TOPROL-XL) 50 MG 24 hr tablet TAKE ONE (1) TABLET EACH DAY 90 tablet 3   montelukast (SINGULAIR) 10 MG tablet Take 1 tablet (10 mg total) by mouth at bedtime. 30 tablet 3   oxyCODONE-acetaminophen (PERCOCET/ROXICET) 5-325 MG tablet Take 1 tablet by mouth every 6 (six) hours as needed (cough suppression). 20 tablet 0   phenazopyridine (PYRIDIUM) 95 MG tablet Take 1 tablet (95 mg total) by mouth 3 (three) times daily as needed for pain. 10 tablet 0   promethazine-dextromethorphan (PROMETHAZINE-DM) 6.25-15 MG/5ML syrup Take 5 mLs by mouth 4 (four) times daily as needed for cough. 118 mL 0   sodium chloride (OCEAN) 0.65 % SOLN nasal spray Place 1 spray into both nostrils as needed for congestion. 30 mL 1   SUMAtriptan (IMITREX) 50 MG tablet TAKE 1 TABLET AT START OF HEADACHE MAY REPEAT IN 4 HOURS IF NEEDED 9 tablet 5              Objective:     BP 134/86 (BP Location: Left Arm, Patient Position: Sitting)    Pulse (!) 102    Temp 98.4 F (36.9 C) (Temporal)    Ht 5\' 7"  (1.702 m)    Wt (!) 308 lb 0.6 oz (139.7 kg)    LMP 02/24/2013    SpO2 97% Comment: ra   BMI 48.25 kg/m   SpO2: 97 % (ra)  Obese  amb  wf nad / freq throat clearing honking cough / occ cough at end inspiration    HEENT : pt wearing mask not removed for exam due to covid -19 concerns.    NECK :  without JVD/Nodes/TM/ nl carotid upstrokes bilaterally   LUNGS: no acc muscle use,  Nl contour chest which is clear to A and P bilaterally without cough on insp or exp maneuvers   CV:  RRR  no s3 or murmur or increase in P2, and no edema   ABD:  obese soft and nontender with nl inspiratory excursion in the supine position. No bruits or organomegaly appreciated, bowel sounds nl  MS:  Nl gait/ ext warm without  deformities, calf tenderness, cyanosis or clubbing No obvious joint restrictions   SKIN: warm and dry without lesions    NEURO:  alert, approp, nl sensorium with  no motor or cerebellar deficits apparent.    CXR PA and Lateral:   05/30/2021 :    I personally reviewed images and impression is as follows:     Wnl      Assessment   Upper airway cough syndrome Lifelong tendency  to "croupy" cough  - Allergy profile 05/30/2021 >  Eos 0. /  IgE   - 05/30/2021  gerd rx plus 1st gen H1 blockers per guidelines  Upper airway cough syndrome (previously labeled PNDS),  is so named because it's frequently impossible to sort out how much is  CR/sinusitis with freq throat clearing (which can be related to primary GERD)   vs  causing  secondary (" extra esophageal")  GERD from wide swings in gastric pressure that occur with throat clearing, often  promoting self use of mint and menthol lozenges that reduce the lower esophageal sphincter tone and exacerbate the problem further in a cyclical fashion.   These are the same pts (now being labeled as having "irritable larynx syndrome" by some cough centers) who not infrequently have a history of having failed to tolerate ace inhibitors,  dry powder inhalers(like anoro) or biphosphonates or report having atypical/extraesophageal reflux symptoms that don't respond to standard doses of PPI  and are easily confused as having aecopd or asthma flares by even experienced allergists/ pulmonologists (myself included).     Also, of the three most common causes of  Sub-acute / recurrent or chronic cough, only one (GERD)  can actually contribute to/ trigger  the other two (asthma and post nasal drip syndrome)  and perpetuate the cylce of cough.  While not intuitively obvious, many patients with chronic low grade reflux do not cough until there is a primary insult that disturbs the protective epithelial barrier and exposes sensitive nerve endings.     This is typically post  viral but can due to PNDS and  Either or both may apply here.   The point is that once this occurs, it is difficult to eliminate the cycle  using anything but a maximally effective acid suppression regimen at least in the short run, accompanied by an appropriate diet to address non acid GERD and control / eliminate pnds with 1st gen H1 blockers per guidelines  And  the cough itself with delsym daytime and phenergan DM and leave off inhalers/pred for now  Discussed in detail all the  indications, usual  risks and alternatives  relative to the benefits with patient who agrees to proceed with Rx as outlined.      F/u  in 4 weeks      Each maintenance medication was reviewed in detail including emphasizing most importantly the difference between maintenance and prns and under what circumstances the prns are to be triggered using an action plan format where appropriate.  Total time for H and P, chart review, counseling, reviewing hfa device(s) and generating customized AVS unique to this new pt office visit / same day charting > 45 min          Morbid obesity due to excess calories ([place]) Body mass index is 48.25 kg/m.  -   Lab Results  Component Value Date   TSH 3.470 07/26/2019     Contributing to doe and risk of GERD >>>    needs to achieve and maintain neg calorie balance > defer f/u primary care including intermittently monitoring thyroid status        Christinia Gully, MD 05/30/2021

## 2021-05-30 NOTE — Assessment & Plan Note (Addendum)
Lifelong tendency  to "croupy" cough  - Allergy profile 05/30/2021 >  Eos 0. /  IgE   - 05/30/2021 gerd rx plus 1st gen H1 blockers per guidelines  Upper airway cough syndrome (previously labeled PNDS),  is so named because it's frequently impossible to sort out how much is  CR/sinusitis with freq throat clearing (which can be related to primary GERD)   vs  causing  secondary (" extra esophageal")  GERD from wide swings in gastric pressure that occur with throat clearing, often  promoting self use of mint and menthol lozenges that reduce the lower esophageal sphincter tone and exacerbate the problem further in a cyclical fashion.   These are the same pts (now being labeled as having "irritable larynx syndrome" by some cough centers) who not infrequently have a history of having failed to tolerate ace inhibitors,  dry powder inhalers(like anoro) or biphosphonates or report having atypical/extraesophageal reflux symptoms that don't respond to standard doses of PPI  and are easily confused as having aecopd or asthma flares by even experienced allergists/ pulmonologists (myself included).     Also, of the three most common causes of  Sub-acute / recurrent or chronic cough, only one (GERD)  can actually contribute to/ trigger  the other two (asthma and post nasal drip syndrome)  and perpetuate the cylce of cough.  While not intuitively obvious, many patients with chronic low grade reflux do not cough until there is a primary insult that disturbs the protective epithelial barrier and exposes sensitive nerve endings.     This is typically post viral but can due to PNDS and  Either or both may apply here.   The point is that once this occurs, it is difficult to eliminate the cycle  using anything but a maximally effective acid suppression regimen at least in the short run, accompanied by an appropriate diet to address non acid GERD and control / eliminate pnds with 1st gen H1 blockers per guidelines  And  the  cough itself with delsym daytime and phenergan DM and leave off inhalers/pred for now  Discussed in detail all the  indications, usual  risks and alternatives  relative to the benefits with patient who agrees to proceed with Rx as outlined.      F/u  in 4 weeks      Each maintenance medication was reviewed in detail including emphasizing most importantly the difference between maintenance and prns and under what circumstances the prns are to be triggered using an action plan format where appropriate.  Total time for H and P, chart review, counseling, reviewing hfa device(s) and generating customized AVS unique to this new pt office visit / same day charting > 45 min

## 2021-05-30 NOTE — Assessment & Plan Note (Addendum)
Body mass index is 48.25 kg/m.  -   Lab Results  Component Value Date   TSH 3.470 07/26/2019      Contributing to doe and risk of GERD >>>   Needs  to achieve and maintain neg calorie balance > defer f/u primary care including intermittently monitoring thyroid status

## 2021-05-30 NOTE — Patient Instructions (Addendum)
Stop anoro and mucinex and zyrtec and vistaril   Only use your albuterol as a rescue medication to be used if you can't catch your breath by resting or doing a relaxed purse lip breathing pattern.  - The less you use it, the better it will work when you need it. - Ok to use up to 2 puffs  every 4 hours if you must but call for immediate appointment if use goes up over your usual need - Don't leave home without it !!  (think of it like the spare tire for your car)   For drainage / throat tickle try take CHLORPHENIRAMINE  4 mg ("allergy relief, dark blue box)   at [employer] one every 4 hours as needed - available over the counter- may cause drowsiness so start with just a dose or two an hour before bedtime and see how you tolerate it before trying in daytime   Pantoprazole (protonix) 40 mg   Take  30-60 min before first meal of the day and Pepcid (famotidine)  20 mg after supper until return to office - this is the best way to tell whether stomach acid is contributing to your problem - take these  GERD (REFLUX)  is an extremely common cause of respiratory symptoms just like yours , many times with no obvious heartburn at all.    It can be treated with medication, but also with lifestyle changes including elevation of the head of your bed (ideally with 6 -8inch blocks under the headboard of your bed),  Smoking cessation, avoidance of late meals, excessive alcohol, and avoid fatty foods, chocolate, peppermint, colas, red wine, and acidic juices such as orange juice.  NO MINT OR MENTHOL PRODUCTS SO NO COUGH DROPS  USE SUGARLESS CANDY INSTEAD (Jolley ranchers or Stover's or Life Savers) or even ice chips will also do - the key is to swallow to prevent all throat clearing. NO OIL BASED VITAMINS - use powdered substitutes.  Avoid fish oil when coughing.    Best syrup > deslym 2 tsp every am then use promethazine dm as needed   Please remember to go to the lab department   for your tests -  we will call you with the results when they are available.      Please remember to go to the  x-ray department  @  [facility] for your tests - we will call you with the results when they are available     Please schedule a follow up office visit in 4 weeks, sooner if needed

## 2021-06-02 LAB — CBC WITH DIFFERENTIAL/PLATELET
Basophils Absolute: 0.1 10*3/uL (ref 0.0–0.2)
Basos: 1 %
EOS (ABSOLUTE): 0.1 10*3/uL (ref 0.0–0.4)
Eos: 1 %
Hematocrit: 40 % (ref 34.0–46.6)
Hemoglobin: 12.8 g/dL (ref 11.1–15.9)
Immature Grans (Abs): 0 10*3/uL (ref 0.0–0.1)
Immature Granulocytes: 1 %
Lymphocytes Absolute: 1.3 10*3/uL (ref 0.7–3.1)
Lymphs: 19 %
MCH: 28.4 pg (ref 26.6–33.0)
MCHC: 32 g/dL (ref 31.5–35.7)
MCV: 89 fL (ref 79–97)
Monocytes Absolute: 0.6 10*3/uL (ref 0.1–0.9)
Monocytes: 9 %
Neutrophils Absolute: 4.7 10*3/uL (ref 1.4–7.0)
Neutrophils: 69 %
Platelets: 215 10*3/uL (ref 150–450)
RBC: 4.51 x10E6/uL (ref 3.77–5.28)
RDW: 13.4 % (ref 11.7–15.4)
WBC: 6.7 10*3/uL (ref 3.4–10.8)

## 2021-06-02 LAB — IGE: IgE (Immunoglobulin E), Serum: 34 IU/mL (ref 6–495)

## 2021-07-01 ENCOUNTER — Other Ambulatory Visit: Payer: Self-pay | Admitting: Family Medicine

## 2021-07-01 ENCOUNTER — Telehealth: Payer: Self-pay | Admitting: Family Medicine

## 2021-07-01 MED ORDER — VALACYCLOVIR HCL 1 G PO TABS: 1000.0000 mg | ORAL_TABLET | Freq: Two times a day (BID) | ORAL | 0 refills | Status: DC

## 2021-07-01 NOTE — Telephone Encounter (Signed)
Pt aware meds have sent

## 2021-07-04 ENCOUNTER — Ambulatory Visit: Payer: Self-pay | Admitting: Internal Medicine

## 2021-07-11 ENCOUNTER — Encounter: Payer: Self-pay | Admitting: Family Medicine

## 2021-07-11 ENCOUNTER — Ambulatory Visit (INDEPENDENT_AMBULATORY_CARE_PROVIDER_SITE_OTHER): Payer: Self-pay | Admitting: Family Medicine

## 2021-07-11 DIAGNOSIS — J0110 Acute frontal sinusitis, unspecified: Secondary | ICD-10-CM

## 2021-07-11 NOTE — Progress Notes (Signed)
Virtual Visit via telephone Note  I connected with Cheryl Randall on 07/11/21 at 1207 by telephone and verified that I am speaking with the correct person using two identifiers. Cheryl Randall is currently located at home and patient are currently with her during visit. The provider, Fransisca Kaufmann Lanis Storlie, MD is located in their office at time of visit.  Call ended at 1217  I discussed the limitations, risks, security and privacy concerns of performing an evaluation and management service by telephone and the availability of in person appointments. I also discussed with the patient that there may be a patient responsible charge related to this service. The patient expressed understanding and agreed to proceed.   History and Present Illness: Patient is calling in for sinus congestion and phlegm and feels chest congestion and cough.  She started 2 days ago. She denies fevers or chills or SOB or Wheezing. She denies any sick contacts. She has been using mucinex and cough medicine.  She feels like a croupy cough. She also has sore throat and postnasal drainage last night.   1. Acute non-recurrent frontal sinusitis     Outpatient Encounter Medications as of 07/11/2021  Medication Sig   acetaminophen (TYLENOL) 325 MG tablet Take 650 mg by mouth every 6 (six) hours as needed for mild pain.   albuterol (VENTOLIN HFA) 108 (90 Base) MCG/ACT inhaler Inhale into the lungs every 6 (six) hours as needed.   citalopram (CELEXA) 40 MG tablet Take 1 tablet (40 mg total) by mouth daily. (NEEDS TO BE SEEN BEFORE NEXT REFILL)   famotidine (PEPCID) 20 MG tablet One after supper   hydrochlorothiazide (HYDRODIURIL) 25 MG tablet Take 0.5-1 tablets (12.5-25 mg total) by mouth daily. For BP and fluid   ibuprofen (ADVIL) 800 MG tablet Take 1 tablet (800 mg total) by mouth every 8 (eight) hours as needed for moderate pain.   metoprolol succinate (TOPROL-XL) 50 MG 24 hr tablet TAKE ONE (1) TABLET EACH DAY   montelukast  (SINGULAIR) 10 MG tablet Take 1 tablet (10 mg total) by mouth at bedtime.   oxyCODONE-acetaminophen (PERCOCET/ROXICET) 5-325 MG tablet Take 1 tablet by mouth every 6 (six) hours as needed (cough suppression).   pantoprazole (PROTONIX) 40 MG tablet Take 1 tablet (40 mg total) by mouth daily. Take 30-60 min before first meal of the day   phenazopyridine (PYRIDIUM) 95 MG tablet Take 1 tablet (95 mg total) by mouth 3 (three) times daily as needed for pain.   promethazine-dextromethorphan (PROMETHAZINE-DM) 6.25-15 MG/5ML syrup Take 5 mLs by mouth 4 (four) times daily as needed for cough.   sodium chloride (OCEAN) 0.65 % SOLN nasal spray Place 1 spray into both nostrils as needed for congestion.   SUMAtriptan (IMITREX) 50 MG tablet TAKE 1 TABLET AT START OF HEADACHE MAY REPEAT IN 4 HOURS IF NEEDED   valACYclovir (VALTREX) 1000 MG tablet Take 1 tablet (1,000 mg total) by mouth 2 (two) times daily.   No facility-administered encounter medications on file as of 07/11/2021.    Review of Systems  Constitutional:  Negative for chills and fever.  HENT:  Positive for congestion, postnasal drip, rhinorrhea, sinus pressure and sore throat. Negative for ear discharge, ear pain and sneezing.   Eyes:  Negative for pain, redness and visual disturbance.  Respiratory:  Positive for cough. Negative for chest tightness and shortness of breath.   Cardiovascular:  Negative for chest pain and leg swelling.  Genitourinary:  Negative for difficulty urinating and dysuria.  Musculoskeletal:  Negative for back pain and gait problem.  Skin:  Negative for rash.  Neurological:  Negative for light-headedness and headaches.  Psychiatric/Behavioral:  Negative for agitation and behavioral problems.   All other systems reviewed and are negative.  Observations/Objective: Patient sounds comfortable and in no acute distress.  Assessment and Plan: Problem List Items Addressed This Visit   None Visit Diagnoses     Acute  non-recurrent frontal sinusitis    -  Primary   Relevant Orders   COVID-19, Flu A+B and RSV       Manage conservatively with Mucinex and Flonase and cough medicine unless worsens or develops fevers or is prolonged more than 5 to 7 days.  Likely viral Recommended to come in for testing. Follow up plan: Return if symptoms worsen or fail to improve.     I discussed the assessment and treatment plan with the patient. The patient was provided an opportunity to ask questions and all were answered. The patient agreed with the plan and demonstrated an understanding of the instructions.   The patient was advised to call back or seek an in-person evaluation if the symptoms worsen or if the condition fails to improve as anticipated.  The above assessment and management plan was discussed with the patient. The patient verbalized understanding of and has agreed to the management plan. Patient is aware to call the clinic if symptoms persist or worsen. Patient is aware when to return to the clinic for a follow-up visit. Patient educated on when it is appropriate to go to the emergency department.    I provided 10 minutes of non-face-to-face time during this encounter.    Worthy Rancher, MD

## 2021-07-15 ENCOUNTER — Other Ambulatory Visit: Payer: Self-pay | Admitting: Family Medicine

## 2021-07-15 ENCOUNTER — Other Ambulatory Visit: Payer: Self-pay | Admitting: Cardiology

## 2021-07-15 DIAGNOSIS — F411 Generalized anxiety disorder: Secondary | ICD-10-CM

## 2021-07-15 NOTE — Telephone Encounter (Signed)
[place]. NTBS 30 days given 05/06/21

## 2021-07-16 MED ORDER — CITALOPRAM HYDROBROMIDE 40 MG PO TABS: 40.0000 mg | ORAL_TABLET | Freq: Every day | ORAL | 0 refills | Status: DC

## 2021-07-16 NOTE — Addendum Note (Signed)
Addended by: Antonietta Barcelona D on: 07/16/2021 01:01 PM   Modules accepted: Orders

## 2021-07-16 NOTE — Telephone Encounter (Signed)
Pt has appt on 08/12/2021 with Dr. Darnell Level. Needs refill sent to [facility]

## 2021-07-23 ENCOUNTER — Other Ambulatory Visit: Payer: Self-pay | Admitting: Family Medicine

## 2021-07-23 DIAGNOSIS — G894 Chronic pain syndrome: Secondary | ICD-10-CM

## 2021-07-23 DIAGNOSIS — R062 Wheezing: Secondary | ICD-10-CM

## 2021-07-23 DIAGNOSIS — F411 Generalized anxiety disorder: Secondary | ICD-10-CM

## 2021-07-23 DIAGNOSIS — R053 Chronic cough: Secondary | ICD-10-CM

## 2021-07-23 DIAGNOSIS — S32009K Unspecified fracture of unspecified lumbar vertebra, subsequent encounter for fracture with nonunion: Secondary | ICD-10-CM

## 2021-07-23 MED ORDER — CITALOPRAM HYDROBROMIDE 40 MG PO TABS: 40.0000 mg | ORAL_TABLET | Freq: Every day | ORAL | 3 refills | Status: DC

## 2021-07-23 MED ORDER — MONTELUKAST SODIUM 10 MG PO TABS: 10.0000 mg | ORAL_TABLET | Freq: Every day | ORAL | 3 refills | Status: DC

## 2021-07-23 MED ORDER — IBUPROFEN 800 MG PO TABS: 800.0000 mg | ORAL_TABLET | Freq: Three times a day (TID) | ORAL | 12 refills | Status: DC | PRN

## 2021-07-23 MED ORDER — VALACYCLOVIR HCL 1 G PO TABS: 1000.0000 mg | ORAL_TABLET | Freq: Two times a day (BID) | ORAL | 0 refills | Status: DC

## 2021-08-10 ENCOUNTER — Other Ambulatory Visit: Payer: Self-pay

## 2021-08-10 ENCOUNTER — Emergency Department (HOSPITAL_COMMUNITY): Payer: Self-pay

## 2021-08-10 ENCOUNTER — Emergency Department (HOSPITAL_COMMUNITY)
Admission: EM | Admit: 2021-08-10 | Discharge: 2021-08-10 | Disposition: A | Discharge status: Home / Self Care | Payer: Self-pay | Attending: Emergency Medicine | Admitting: Emergency Medicine

## 2021-08-10 ENCOUNTER — Encounter (HOSPITAL_COMMUNITY): Payer: Self-pay | Admitting: Emergency Medicine

## 2021-08-10 ENCOUNTER — Other Ambulatory Visit: Payer: Self-pay | Admitting: Family Medicine

## 2021-08-10 DIAGNOSIS — R051 Acute cough: Secondary | ICD-10-CM

## 2021-08-10 DIAGNOSIS — R0602 Shortness of breath: Secondary | ICD-10-CM | POA: Insufficient documentation

## 2021-08-10 DIAGNOSIS — R059 Cough, unspecified: Secondary | ICD-10-CM | POA: Insufficient documentation

## 2021-08-10 DIAGNOSIS — Z91040 Latex allergy status: Secondary | ICD-10-CM | POA: Insufficient documentation

## 2021-08-10 DIAGNOSIS — Z79899 Other long term (current) drug therapy: Secondary | ICD-10-CM | POA: Insufficient documentation

## 2021-08-10 DIAGNOSIS — E119 Type 2 diabetes mellitus without complications: Secondary | ICD-10-CM | POA: Insufficient documentation

## 2021-08-10 DIAGNOSIS — I10 Essential (primary) hypertension: Secondary | ICD-10-CM | POA: Insufficient documentation

## 2021-08-10 LAB — BASIC METABOLIC PANEL
Anion gap: 8 (ref 5–15)
BUN: 10 mg/dL (ref 6–20)
CO2: 27 mmol/L (ref 22–32)
Calcium: 9.2 mg/dL (ref 8.9–10.3)
Chloride: 101 mmol/L (ref 98–111)
Creatinine, Ser: 0.73 mg/dL (ref 0.44–1.00)
GFR, Estimated: >60 mL/min (ref >60)
Glucose, Bld: 105 mg/dL — ABNORMAL HIGH (ref 70–99)
Potassium: 3.7 mmol/L (ref 3.5–5.1)
Sodium: 136 mmol/L (ref 135–145)

## 2021-08-10 LAB — CBC WITH DIFFERENTIAL/PLATELET
Abs Immature Granulocytes: 0.02 10*3/uL (ref 0.00–0.07)
Basophils Absolute: 0 10*3/uL (ref 0.0–0.1)
Basophils Relative: 1 %
Eosinophils Absolute: 0.1 10*3/uL (ref 0.0–0.5)
Eosinophils Relative: 2 %
HCT: 40.7 % (ref 36.0–46.0)
Hemoglobin: 12.9 g/dL (ref 12.0–15.0)
Immature Granulocytes: 0 %
Lymphocytes Relative: 17 %
Lymphs Abs: 0.9 10*3/uL (ref 0.7–4.0)
MCH: 28.9 pg (ref 26.0–34.0)
MCHC: 31.7 g/dL (ref 30.0–36.0)
MCV: 91.1 fL (ref 80.0–100.0)
Monocytes Absolute: 0.5 10*3/uL (ref 0.1–1.0)
Monocytes Relative: 10 %
Neutro Abs: 3.9 10*3/uL (ref 1.7–7.7)
Neutrophils Relative %: 70 %
Platelets: 172 10*3/uL (ref 150–400)
RBC: 4.47 MIL/uL (ref 3.87–5.11)
RDW: 12.7 % (ref 11.5–15.5)
WBC: 5.4 10*3/uL (ref 4.0–10.5)
nRBC: 0 % (ref 0.0–0.2)

## 2021-08-10 LAB — TROPONIN I (HIGH SENSITIVITY): Troponin I (High Sensitivity): 4 ng/L (ref <18)

## 2021-08-10 LAB — BRAIN NATRIURETIC PEPTIDE: B Natriuretic Peptide: 70 pg/mL (ref 0.0–100.0)

## 2021-08-10 MED ORDER — ALBUTEROL SULFATE HFA 108 (90 BASE) MCG/ACT IN AERS: 1.0000 | INHALATION_SPRAY | Freq: Four times a day (QID) | RESPIRATORY_TRACT | 0 refills | Status: DC | PRN

## 2021-08-10 MED ORDER — PREDNISONE 10 MG (21) PO TBPK: ORAL_TABLET | Freq: Every day | ORAL | 0 refills | Status: DC

## 2021-08-10 MED ORDER — ALBUTEROL SULFATE HFA 108 (90 BASE) MCG/ACT IN AERS: 2.0000 | INHALATION_SPRAY | RESPIRATORY_TRACT | Status: DC | PRN

## 2021-08-10 MED ORDER — BENZONATATE 100 MG PO CAPS: 100.0000 mg | ORAL_CAPSULE | Freq: Three times a day (TID) | ORAL | 0 refills | Status: DC

## 2021-08-10 NOTE — ED Notes (Signed)
Pt provided discharge instructions and prescription information. Pt was given the opportunity to ask questions and questions were answered. Discharge signature not obtained in the setting of the COVID-19 pandemic in order to reduce high touch surfaces.  ° °

## 2021-08-10 NOTE — ED Triage Notes (Signed)
Patient c/o nonproductive coughing and shortness of breath. Per patient using inhaler and cough medication with no relief. Per patient prescribed cough medication (promethazine) and inhaler for bronchitis 4/6. Per patient soreness in mid chest that radiates into back. Hx of palpitations. Denies any fevers. ?

## 2021-08-10 NOTE — ED Provider Notes (Signed)
?[facility] ?Provider Note ? ? ?CSN: [id number] ?Arrival date & time: 08/10/21  1233 ? ?  ? ?History ? ?Chief Complaint  ?Patient presents with  ? Shortness of Breath  ? ? ?Cheryl Randall is a 46 y.o. female  who presents with concern for 48 hours of cough that is nonproductive with associated shortness of breath.  She has been using albuterol inhaler from a year ago as well as over-the-counter promethazine for her cough prescribed in April 2022.  She endorses chest tightness that has been relieved by her albuterol inhaler but denies any shortness of breath, chest pain, palpitations, fevers, or chills. ? ?She is on montelukast daily.  I personally reviewed her medical records.  She has history of diabetes, pseudoarthrosis of the lumbar spine, degenerative disc disease, generalized anxiety disorder, hypertension.  She is not anticoagulated. ? ?HPI ? ?  ? ?Home Medications ?Prior to Admission medications   ?Medication Sig Start Date End Date Taking? Authorizing Provider  ?acetaminophen (TYLENOL) 325 MG tablet Take 650 mg by mouth every 6 (six) hours as needed for mild pain.    [provider]  ?albuterol (VENTOLIN HFA) 108 (90 Base) MCG/ACT inhaler Inhale into the lungs every 6 (six) hours as needed.    [provider]  ?citalopram (CELEXA) 40 MG tablet Take 1 tablet (40 mg total) by mouth daily. 07/23/21   Janora Norlander, DO  ?famotidine (PEPCID) 20 MG tablet One after supper 05/30/21   Tanda Rockers, MD  ?hydrochlorothiazide (HYDRODIURIL) 25 MG tablet Take 0.5-1 tablets (12.5-25 mg total) by mouth daily. For BP and fluid 10/01/20   Ronnie Doss M, DO  ?ibuprofen (ADVIL) 800 MG tablet Take 1 tablet (800 mg total) by mouth every 8 (eight) hours as needed for moderate pain. 07/23/21   Janora Norlander, DO  ?metoprolol succinate (TOPROL-XL) 50 MG 24 hr tablet TAKE ONE (1) TABLET EACH DAY 07/15/21   Josue Hector, MD  ?montelukast (SINGULAIR) 10 MG tablet Take 1 tablet  (10 mg total) by mouth at bedtime. 07/23/21   Janora Norlander, DO  ?oxyCODONE-acetaminophen (PERCOCET/ROXICET) 5-325 MG tablet Take 1 tablet by mouth every 6 (six) hours as needed (cough suppression). 04/26/21   Evalee Jefferson, PA-C  ?pantoprazole (PROTONIX) 40 MG tablet Take 1 tablet (40 mg total) by mouth daily. Take 30-60 min before first meal of the day 05/30/21   Tanda Rockers, MD  ?phenazopyridine (PYRIDIUM) 95 MG tablet Take 1 tablet (95 mg total) by mouth 3 (three) times daily as needed for pain. 05/03/21   Ivy Lynn, NP  ?promethazine-dextromethorphan (PROMETHAZINE-DM) 6.25-15 MG/5ML syrup Take 5 mLs by mouth 4 (four) times daily as needed for cough. 05/28/21   Baruch Gouty, FNP  ?sodium chloride (OCEAN) 0.65 % SOLN nasal spray Place 1 spray into both nostrils as needed for congestion. 03/26/21   Ivy Lynn, NP  ?SUMAtriptan (IMITREX) 50 MG tablet TAKE 1 TABLET AT START OF HEADACHE MAY REPEAT IN 4 HOURS IF NEEDED 05/08/21   Ronnie Doss M, DO  ?valACYclovir (VALTREX) 1000 MG tablet Take 1 tablet (1,000 mg total) by mouth 2 (two) times daily. As needed for cold sore flareups 07/23/21   Janora Norlander, DO  ?   ? ?Allergies    ?Codeine, Latex, Norco [hydrocodone-acetaminophen], and Tape   ? ?Review of Systems   ?Review of Systems  ?Constitutional: Negative.   ?HENT: Negative.    ?Eyes: Negative.   ?Respiratory:  Positive for  cough. Negative for choking, chest tightness and shortness of breath.   ?Cardiovascular: Negative.   ?Gastrointestinal: Negative.   ?Genitourinary: Negative.   ?Musculoskeletal: Negative.   ?Skin: Negative.   ?Neurological: Negative.   ? ?Physical Exam ?Updated Vital Signs ?BP 134/78 (BP Location: Right Arm)   Pulse 89   Temp 97.7 ?F (36.5 ?C) (Oral)   Resp 19   Ht '5\' 7"'$  (1.702 m)   Wt (!) 139.5 kg   LMP 02/24/2013   SpO2 98%   BMI 48.16 kg/m?  ?Physical Exam ?Vitals and nursing note reviewed.  ?Constitutional:   ?   Appearance: She is not ill-appearing or  toxic-appearing.  ?HENT:  ?   Head: Normocephalic and atraumatic.  ?   Mouth/Throat:  ?   Mouth: Mucous membranes are moist.  ?   Pharynx: No oropharyngeal exudate or posterior oropharyngeal erythema.  ?Eyes:  ?   General:     ?   Right eye: No discharge.     ?   Left eye: No discharge.  ?   Extraocular Movements: Extraocular movements intact.  ?   Conjunctiva/sclera: Conjunctivae normal.  ?   Pupils: Pupils are equal, round, and reactive to light.  ?Cardiovascular:  ?   Rate and Rhythm: Normal rate and regular rhythm.  ?   Pulses: Normal pulses.  ?Pulmonary:  ?   Effort: Pulmonary effort is normal. No respiratory distress.  ?   Breath sounds: Normal breath sounds. No decreased breath sounds, wheezing, rhonchi or rales.  ?   Comments: Dry cough throughout exam ?Chest:  ?   Chest wall: No mass, tenderness or edema.  ?Abdominal:  ?   General: Bowel sounds are normal. There is no distension.  ?   Palpations: Abdomen is soft.  ?   Tenderness: There is no abdominal tenderness.  ?Musculoskeletal:     ?   General: No deformity. Normal range of motion.  ?   Cervical back: Neck supple.  ?Skin: ?   General: Skin is warm and dry.  ?   Capillary Refill: Capillary refill takes less than 2 seconds.  ?Neurological:  ?   General: No focal deficit present.  ?   Mental Status: She is alert and oriented to person, place, and time. Mental status is at baseline.  ?Psychiatric:     ?   Mood and Affect: Mood normal.  ? ? ?ED Results / Procedures / Treatments   ?Labs ?(all labs ordered are listed, but only abnormal results are displayed) ?Labs Reviewed - No data to display ? ?EKG ?None ? ?Radiology ?No results found. ? ?Procedures ?Procedures  ? ? ?Medications Ordered in ED ?Medications  ?albuterol (VENTOLIN HFA) 108 (90 Base) MCG/ACT inhaler 2 puff (has no administration in time range)  ? ? ?ED Course/ Medical Decision Making/ A&P ?  ?                        ?Medical Decision Making ?29 Who presents with 2 days of dry cough. ? ?Vital  signs are normal and intake.  Cardiopulmonary exam is normal, patient is experiencing dry cough throughout exam.  Abdominal exam is benign.  Patient is neurovascular tact in all 4 extremities. ? ?Differential diagnose includes limited to bronchitis, reactive airway disease, pneumonia, pleural effusion, ACS. ?Patient is PERC negative. ? ? ?Amount and/or Complexity of Data Reviewed ?Labs: ordered. ?   Details: CBC unremarkable, BMP unremarkable, troponin is negative, BNP is normal. ?Radiology: ordered. ?  Details: Chest x-ray for acute cardiopulmonary disease.  EKG with sinus rhythm without STEMI. ? ?Risk ?Prescription drug management. ? ? ?Clinically patient's presentation most consistent with bronchitis or reactive airway disease.  Steroid prescribed, patient requesting albuterol refill.  This has been ordered.  Also requesting Ladona Ridgel which is offered. ? ?Clinical concern for more emergent underlying etiology or further ED work-up or inpatient management is exceedingly low. ? ?Cheryl Randall voiced understanding of her medical evaluation and treatment plan.  Each of her questions answered to express infection.  Return precautions given.  Patient is well-appearing, stable, and discharged in good condition. ? ? ?This chart was dictated using voice recognition software, Dragon. Despite the best efforts of this provider to proofread and correct errors, errors may still occur which can change documentation meaning. ? ?Final Clinical Impression(s) / ED Diagnoses ?Final diagnoses:  ?None  ? ? ?Rx / DC Orders ?ED Discharge Orders   ? ? None  ? ?  ? ? ?  ?Emeline Darling, PA-C ?08/10/21 1756 ? ?  ?Lajean Saver, MD ?08/12/21 1500 ? ?

## 2021-08-10 NOTE — Discharge Instructions (Signed)
Patient here today for your cough and your wheezing.  You likely have bronchitis.  You have been prescribed a refill of your albuterol inhaler as well as a steroid taper which you may take over the next week.  Please follow-up with primary care doctor.  You have also been prescribed cough medicine which may use as needed. ? ?Return to the ER if you develop any new severe symptom. ?

## 2021-08-12 ENCOUNTER — Other Ambulatory Visit: Payer: Self-pay | Admitting: Family Medicine

## 2021-08-12 ENCOUNTER — Telehealth: Payer: Self-pay | Admitting: Family Medicine

## 2021-08-12 ENCOUNTER — Ambulatory Visit: Payer: Self-pay | Admitting: Family Medicine

## 2021-08-12 MED ORDER — PROMETHAZINE-DM 6.25-15 MG/5ML PO SYRP: 5.0000 mL | ORAL_SOLUTION | Freq: Four times a day (QID) | ORAL | 0 refills | Status: DC | PRN

## 2021-08-12 NOTE — Telephone Encounter (Signed)
?  Prescription Request ? ?08/12/2021 ? ?Is this a "Controlled Substance" medicine? promethazine-dextromethorphan (PROMETHAZINE-DM) 6.25-15 MG/5ML syrup ? ?Have you seen your PCP in the last 2 weeks? no ? ?If YES, route message to pool  -  If NO, patient needs to be scheduled for appointment. ? ?What is the name of the medication or equipment? promethazine-dextromethorphan (PROMETHAZINE-DM) 6.25-15 MG/5ML syrup ? ?Have you contacted your pharmacy to request a refill? yes  ? ?Which pharmacy would you like this sent to? [facility]. ?I explained to pt that she need an apt for this. She would not take no for an answer. She says that she already spoke to [place] and was suppose to send in a refill on all her medications including this rx until her next apt in August. Please call back ? ? ?Patient notified that their request is being sent to the clinical staff for review and that they should receive a response within 2 business days.  ? ? ? ?

## 2021-08-12 NOTE — Telephone Encounter (Signed)
Seen in er over weekend. ?

## 2021-08-12 NOTE — Telephone Encounter (Signed)
Patient aware and verbalized understanding. °

## 2021-08-15 ENCOUNTER — Emergency Department (HOSPITAL_COMMUNITY)
Admission: EM | Admit: 2021-08-15 | Discharge: 2021-08-15 | Disposition: A | Discharge status: Home / Self Care | Payer: Self-pay | Attending: Emergency Medicine | Admitting: Emergency Medicine

## 2021-08-15 ENCOUNTER — Emergency Department (HOSPITAL_COMMUNITY): Payer: Self-pay

## 2021-08-15 ENCOUNTER — Encounter (HOSPITAL_COMMUNITY): Payer: Self-pay | Admitting: *Deleted

## 2021-08-15 DIAGNOSIS — Z79899 Other long term (current) drug therapy: Secondary | ICD-10-CM | POA: Insufficient documentation

## 2021-08-15 DIAGNOSIS — M546 Pain in thoracic spine: Secondary | ICD-10-CM | POA: Insufficient documentation

## 2021-08-15 DIAGNOSIS — Z20822 Contact with and (suspected) exposure to covid-19: Secondary | ICD-10-CM | POA: Insufficient documentation

## 2021-08-15 DIAGNOSIS — I10 Essential (primary) hypertension: Secondary | ICD-10-CM | POA: Insufficient documentation

## 2021-08-15 DIAGNOSIS — Z91040 Latex allergy status: Secondary | ICD-10-CM | POA: Insufficient documentation

## 2021-08-15 DIAGNOSIS — M7989 Other specified soft tissue disorders: Secondary | ICD-10-CM | POA: Insufficient documentation

## 2021-08-15 DIAGNOSIS — J45909 Unspecified asthma, uncomplicated: Secondary | ICD-10-CM | POA: Insufficient documentation

## 2021-08-15 DIAGNOSIS — J209 Acute bronchitis, unspecified: Secondary | ICD-10-CM | POA: Insufficient documentation

## 2021-08-15 DIAGNOSIS — R2243 Localized swelling, mass and lump, lower limb, bilateral: Secondary | ICD-10-CM | POA: Insufficient documentation

## 2021-08-15 DIAGNOSIS — A493 Mycoplasma infection, unspecified site: Secondary | ICD-10-CM | POA: Insufficient documentation

## 2021-08-15 LAB — RESP PANEL BY RT-PCR (FLU A&B, COVID) ARPGX2
Influenza A by PCR: NEGATIVE
Influenza B by PCR: NEGATIVE
SARS Coronavirus 2 by RT PCR: NEGATIVE

## 2021-08-15 LAB — BASIC METABOLIC PANEL
Anion gap: 10 (ref 5–15)
BUN: 11 mg/dL (ref 6–20)
CO2: 27 mmol/L (ref 22–32)
Calcium: 8.4 mg/dL — ABNORMAL LOW (ref 8.9–10.3)
Chloride: 102 mmol/L (ref 98–111)
Creatinine, Ser: 0.76 mg/dL (ref 0.44–1.00)
GFR, Estimated: >60 mL/min (ref >60)
Glucose, Bld: 149 mg/dL — ABNORMAL HIGH (ref 70–99)
Potassium: 3.5 mmol/L (ref 3.5–5.1)
Sodium: 139 mmol/L (ref 135–145)

## 2021-08-15 LAB — CBC
HCT: 43.8 % (ref 36.0–46.0)
Hemoglobin: 13.6 g/dL (ref 12.0–15.0)
MCH: 28.6 pg (ref 26.0–34.0)
MCHC: 31.1 g/dL (ref 30.0–36.0)
MCV: 92.2 fL (ref 80.0–100.0)
Platelets: 147 10*3/uL — ABNORMAL LOW (ref 150–400)
RBC: 4.75 MIL/uL (ref 3.87–5.11)
RDW: 13 % (ref 11.5–15.5)
WBC: 4.4 10*3/uL (ref 4.0–10.5)
nRBC: 0 % (ref 0.0–0.2)

## 2021-08-15 LAB — D-DIMER, QUANTITATIVE: D-Dimer, Quant: 0.69 ug/mL-FEU — ABNORMAL HIGH (ref 0.00–0.50)

## 2021-08-15 MED ORDER — DOXYCYCLINE HYCLATE 100 MG PO TABS
100.0000 mg | ORAL_TABLET | Freq: Once | ORAL | Status: AC
  Administered 2021-08-15: 100 mg via ORAL
  Filled 2021-08-15: qty 1

## 2021-08-15 MED ORDER — DOXYCYCLINE HYCLATE 100 MG PO CAPS: 100.0000 mg | ORAL_CAPSULE | Freq: Two times a day (BID) | ORAL | 0 refills | Status: DC

## 2021-08-15 MED ORDER — PREDNISONE 50 MG PO TABS
60.0000 mg | ORAL_TABLET | Freq: Once | ORAL | Status: AC
  Administered 2021-08-15: 60 mg via ORAL
  Filled 2021-08-15: qty 1

## 2021-08-15 MED ORDER — ALBUTEROL (5 MG/ML) CONTINUOUS INHALATION SOLN: 10.0000 mg | INHALATION_SOLUTION | RESPIRATORY_TRACT | Status: AC

## 2021-08-15 MED ORDER — PREDNISONE 20 MG PO TABS: 40.0000 mg | ORAL_TABLET | Freq: Every day | ORAL | 0 refills | Status: DC

## 2021-08-15 MED ORDER — ALBUTEROL SULFATE (2.5 MG/3ML) 0.083% IN NEBU
INHALATION_SOLUTION | RESPIRATORY_TRACT | Status: AC
  Administered 2021-08-15: 10 mg
  Filled 2021-08-15: qty 12

## 2021-08-15 NOTE — Discharge Instructions (Addendum)
Your testing today showed no signs of pneumonia however there is a concern that you may have ongoing bronchitis thus I would like for you to take the following medications ? ?Albuterol inhaler 2 puffs every 4 hours for the next 24 hours then 2 puffs every 4 hours as needed ? ?Prednisone daily for the next 5 days ? ?Doxycycline '100mg'$  by mouth twice daily until the medicine is completely finished - this medicine is a strong antibiotic that treats certain infections.   ? ?Thank you for letting us take care of you today! ? ?Please obtain all of your results from medical records or have your doctors office obtain the results - share them with your doctor - you should be seen at your doctors office in the next 2 days. Call today to arrange your follow up. Take the medications as prescribed. Please review all of the medicines and only take them if you do not have an allergy to them. Please be aware that if you are taking birth control pills, taking other prescriptions, ESPECIALLY ANTIBIOTICS may make the birth control ineffective - if this is the case, either do not engage in sexual activity or use alternative methods of birth control such as condoms until you have finished the medicine and your family doctor says it is OK to restart them. If you are on a blood thinner such as COUMADIN, be aware that any other medicine that you take may cause the coumadin to either work too much, or not enough - you should have your coumadin level rechecked in next 7 days if this is the case.  ??  ?It is also a possibility that you have an allergic reaction to any of the medicines that you have been prescribed - Everybody reacts differently to medications and while MOST people have no trouble with most medicines, you may have a reaction such as nausea, vomiting, rash, swelling, shortness of breath. If this is the case, please stop taking the medicine immediately and contact your physician.  ? ?If you were given a medication in the ED such  as percocet, vicodin, or morphine, be aware that these medicines are sedating and may change your ability to take care of yourself adequately for several hours after being given this medicines - you should not drive or take care of small children if you were given this medicine in the Emergency Department or if you have been prescribed these types of medicines. ??  ? You should return to the ER IMMEDIATELY if you develop severe or worsening symptoms.  ? ?

## 2021-08-15 NOTE — ED Triage Notes (Signed)
Return visit for shortness of breath ?

## 2021-08-15 NOTE — ED Notes (Signed)
Pt c/o SOB even when she does not have a cough. Pt stated prior to cough she runs out of breath if she walks from here ( rm 10) to ED entrance she is out of breath.  ?

## 2021-08-15 NOTE — ED Provider Notes (Signed)
[facility] EMERGENCY DEPARTMENT Provider Note   CSN: [id number] Arrival date & time: 08/15/21  1531     History  Chief Complaint  Patient presents with   Shortness of Breath    Cheryl Randall is a 46 y.o. female.   Shortness of Breath  This patient is a 46 year old female, she denies chronic medical problems other than a history of needing Celexa, montelukast and having acid reflux.  She also states that she gets frequent episodes where she gets cases of bronchitis though she was never diagnosed with asthma as a child.  She does not smoke cigarettes anymore, she stopped many years ago.  She reports that about 1 week ago she developed a cough and shortness of breath which seems to be getting worse.  She was actually seen on Saturday, 5 days ago, and had a negative work-up including chest x-ray labs including a BNP CBC metabolic panel and troponin which were all normal.  She was prescribed albuterol and prednisone and ultimately this patient did very well however she continues to be short of breath.  In fact the patient states that she cannot lay down because she cannot breathe, she has been sleeping sitting up.  She has a pain in her mid back as well as underneath her breast bilaterally which seems to be worse with deep breathing.  She also expresses that she is more swollen in her bilateral legs but also notes that the swelling comes and goes over time.  She has no liver failure not on amlodipine either.  Home Medications Prior to Admission medications   Medication Sig Start Date End Date Taking? Authorizing Provider  acetaminophen (TYLENOL) 325 MG tablet Take 650 mg by mouth every 6 (six) hours as needed for mild pain.   Yes [provider]  albuterol (VENTOLIN HFA) 108 (90 Base) MCG/ACT inhaler Inhale 1-2 puffs into the lungs every 6 (six) hours as needed for wheezing or shortness of breath. 08/10/21  Yes Sponseller, Rebekah R, PA-C  benzonatate (TESSALON) 100 MG capsule Take 1  capsule (100 mg total) by mouth every 8 (eight) hours. 08/10/21  Yes Sponseller, Eugene Gavia, PA-C  citalopram (CELEXA) 40 MG tablet Take 1 tablet (40 mg total) by mouth daily. 07/23/21  Yes Delynn Flavin M, DO  doxycycline (VIBRAMYCIN) 100 MG capsule Take 1 capsule (100 mg total) by mouth 2 (two) times daily. 08/15/21  Yes Eber Hong, MD  famotidine (PEPCID) 20 MG tablet One after supper 05/30/21  Yes Nyoka Cowden, MD  metoprolol succinate (TOPROL-XL) 50 MG 24 hr tablet TAKE ONE (1) TABLET EACH DAY Patient taking differently: Take 50 mg by mouth daily. 07/15/21  Yes Wendall Stade, MD  montelukast (SINGULAIR) 10 MG tablet Take 1 tablet (10 mg total) by mouth at bedtime. 07/23/21  Yes Gottschalk, Kathie Rhodes M, DO  predniSONE (DELTASONE) 20 MG tablet Take 2 tablets (40 mg total) by mouth daily. 08/15/21  Yes Eber Hong, MD  promethazine-dextromethorphan (PROMETHAZINE-DM) 6.25-15 MG/5ML syrup Take 5 mLs by mouth 4 (four) times daily as needed for cough. 08/12/21  Yes Gottschalk, Ashly M, DO  SUMAtriptan (IMITREX) 50 MG tablet TAKE 1 TABLET AT START OF HEADACHE MAY REPEAT IN 4 HOURS IF NEEDED 05/08/21  Yes Gottschalk, Ashly M, DO  valACYclovir (VALTREX) 1000 MG tablet Take 1 tablet (1,000 mg total) by mouth 2 (two) times daily. As needed for cold sore flareups 07/23/21  Yes Delynn Flavin M, DO      Allergies    Codeine,  Latex, Norco [hydrocodone-acetaminophen], and Tape    Review of Systems   Review of Systems  Respiratory:  Positive for shortness of breath.   All other systems reviewed and are negative.  Physical Exam Updated Vital Signs BP 105/81   Pulse (!) 117   Temp 97.8 F (36.6 C)   Resp 20   Ht 1.702 m (5\' 7" )   Wt 136.1 kg   LMP 02/24/2013   SpO2 97%   BMI 46.99 kg/m  Physical Exam Vitals and nursing note reviewed.  Constitutional:      General: She is not in acute distress.    Appearance: She is well-developed.  HENT:     Head: Normocephalic and atraumatic.      Mouth/Throat:     Pharynx: No oropharyngeal exudate.  Eyes:     General: No scleral icterus.       Right eye: No discharge.        Left eye: No discharge.     Conjunctiva/sclera: Conjunctivae normal.     Pupils: Pupils are equal, round, and reactive to light.  Neck:     Thyroid: No thyromegaly.     Vascular: No JVD.  Cardiovascular:     Rate and Rhythm: Normal rate and regular rhythm.     Heart sounds: Normal heart sounds. No murmur heard.   No friction rub. No gallop.  Pulmonary:     Effort: Pulmonary effort is normal. No respiratory distress.     Breath sounds: Wheezing present. No rales.     Comments: This patient has abnormal breath sounds, she has wheezing in all lung fields on expiration but is able to speak in full sentences and is not tachypneic Abdominal:     General: Bowel sounds are normal. There is no distension.     Palpations: Abdomen is soft. There is no mass.     Tenderness: There is no abdominal tenderness.  Musculoskeletal:        General: No tenderness. Normal range of motion.     Cervical back: Normal range of motion and neck supple.     Right lower leg: No tenderness. Edema present.     Left lower leg: No tenderness. Edema present.  Lymphadenopathy:     Cervical: No cervical adenopathy.  Skin:    General: Skin is warm and dry.     Findings: No erythema or rash.  Neurological:     Mental Status: She is alert.     Coordination: Coordination normal.  Psychiatric:        Behavior: Behavior normal.    ED Results / Procedures / Treatments   Labs (all labs ordered are listed, but only abnormal results are displayed) Labs Reviewed  D-DIMER, QUANTITATIVE - Abnormal; Notable for the following components:      Result Value   D-Dimer, Quant 0.69 (*)    All other components within normal limits  BASIC METABOLIC PANEL - Abnormal; Notable for the following components:   Glucose, Bld 149 (*)    Calcium 8.4 (*)    All other components within normal limits  CBC  - Abnormal; Notable for the following components:   Platelets 147 (*)    All other components within normal limits  RESP PANEL BY RT-PCR (FLU A&B, COVID) ARPGX2    EKG None  Radiology DG Chest 2 View  Result Date: 08/15/2021 CLINICAL DATA:  Cough.  Shortness of breath.  COVID-19 negative. EXAM: CHEST - 2 VIEW COMPARISON:  08/10/2021 FINDINGS: Numerous leads and wires project  over the chest. Midline trachea. Borderline cardiomegaly. No pleural effusion or pneumothorax. Development of pulmonary interstitial prominence, symmetric. No well-defined lobar consolidation. IMPRESSION: Development of moderate pulmonary interstitial prominence. Given absence of pleural fluid, favor viral or mycoplasma pneumonia. Pulmonary edema could look similar. Electronically Signed   By: Jeronimo Greaves M.D.   On: 08/15/2021 18:48    Procedures Procedures    Medications Ordered in ED Medications  albuterol (PROVENTIL,VENTOLIN) solution continuous neb (10 mg Nebulization Not Given 08/15/21 1649)  doxycycline (VIBRA-TABS) tablet 100 mg (has no administration in time range)  predniSONE (DELTASONE) tablet 60 mg (60 mg Oral Given 08/15/21 1631)  albuterol (PROVENTIL) (2.5 MG/3ML) 0.083% nebulizer solution (10 mg  Given 08/15/21 1634)    ED Course/ Medical Decision Making/ A&P                           Medical Decision Making Amount and/or Complexity of Data Reviewed Labs: ordered. Radiology: ordered.  Risk Prescription drug management.   Despite this patient having significant symptoms of shortness of breath and dyspnea she has absolutely no abnormal findings on her vital signs except for mild hypertension.  She does have diffuse wheezing suggestive of ongoing reactive airway disease which would be consistent with a chronic bronchitis however at this time will need to also rule out pulmonary embolism since she does have some swelling in her legs and no other definite etiology.  D-dimer will be ordered, labs will  be repeated and a continuous nebulizer will be given with steroids.  Improved significantly with meds - pt states she is good for d/c - I agree.  I will treat her with doxycycline given the abnormal finding on x-ray that could be consistent with a mycoplasma.  That being said her lab work was unremarkable.  She is no longer tachycardic on my repeat exam.  She is agreeable with the plan.  Imaging: I personally viewed and it interpreted the x-ray, there is no signs of focal pneumonia  Labs: There is a borderline D-dimer which I do not think is necessary to chase at this time since she is otherwise low risk and has a history of recurrent bronchitis.  I have personally interpreted these labs.  Medication management: She was given albuterol treatments, steroids and will be prescribed doxycycline at discharge.  ED course: Patient for significantly  Patient informed of indications for return and agreeable.        Final Clinical Impression(s) / ED Diagnoses Final diagnoses:  Bronchitis    Rx / DC Orders ED Discharge Orders          Ordered    doxycycline (VIBRAMYCIN) 100 MG capsule  2 times daily        08/15/21 1950    predniSONE (DELTASONE) 20 MG tablet  Daily        08/15/21 1951              Eber Hong, MD 08/15/21 1955

## 2021-10-07 ENCOUNTER — Encounter: Payer: Self-pay | Admitting: Family Medicine

## 2021-10-07 ENCOUNTER — Ambulatory Visit (INDEPENDENT_AMBULATORY_CARE_PROVIDER_SITE_OTHER): Payer: Self-pay | Admitting: Family Medicine

## 2021-10-07 VITALS — BP 108/76 | HR 82 | Temp 97.1°F | Ht 67.0 in | Wt 304.0 lb

## 2021-10-07 DIAGNOSIS — M5416 Radiculopathy, lumbar region: Secondary | ICD-10-CM

## 2021-10-07 DIAGNOSIS — M549 Dorsalgia, unspecified: Secondary | ICD-10-CM

## 2021-10-07 DIAGNOSIS — Z9889 Other specified postprocedural states: Secondary | ICD-10-CM

## 2021-10-07 MED ORDER — DICLOFENAC SODIUM 75 MG PO TBEC: 75.0000 mg | DELAYED_RELEASE_TABLET | Freq: Two times a day (BID) | ORAL | 1 refills | Status: DC

## 2021-10-07 MED ORDER — METHOCARBAMOL 750 MG PO TABS: 750.0000 mg | ORAL_TABLET | Freq: Four times a day (QID) | ORAL | 1 refills | Status: DC

## 2021-10-07 NOTE — Progress Notes (Signed)
? ?  Acute Office Visit ? ?Subjective:  ? ?  ?Patient ID: Cheryl Randall, female    DOB: 25-Feb-1976, 46 y.o.   MRN: [id number] ? ?Chief Complaint  ?Patient presents with  ? Back Pain  ? ? ?HPI ?Patient is in today for back pain. She has a long standing history of chronic back pain and has had to have 4 prior surgeries on her lumbar spine. She reports lower left sided back pain that radiates down her left thigh. She also reports numbness, tingling, and weakness. She denies fever, saddles anesthesia, or changes in bowel or bladder control. She has failed narcotics, gabapentin, tylenol, ibuprofen, steroids, and epidural injections in the past. She is currently working on a disability case. She denies new injury or falls. The pain is constant, worse with activity, and is severe. She is not currently establish with a neurosurgeon and would like a referral to another. Her previous surgeries were done by [facility] but she reports that she became septic after her last surgery and would like to be referred to another office.  ? ?ROS ?As per HPI.  ? ?   ?Objective:  ?  ?BP 108/76   Pulse 82   Temp (!) 97.1 ?F (36.2 ?C) (Temporal)   Ht '5\' 7"'$  (1.702 m)   Wt (!) 304 lb (137.9 kg)   LMP 02/24/2013   BMI 47.61 kg/m?  ?BP Readings from Last 3 Encounters:  ?10/07/21 108/76  ?08/15/21 (!) 137/91  ?08/10/21 112/62  ? ?  ? ?Physical Exam ?Vitals and nursing note reviewed.  ?Constitutional:   ?   General: She is not in acute distress. ?   Appearance: She is not ill-appearing, toxic-appearing or diaphoretic.  ?Pulmonary:  ?   Effort: Pulmonary effort is normal. No respiratory distress.  ?Musculoskeletal:  ?   Lumbar back: Tenderness present. No swelling, edema or bony tenderness. Positive left straight leg raise test. Negative right straight leg raise test.  ?   Comments: Surgical scaring present to lumbar spine.   ?Neurological:  ?   General: No focal deficit present.  ?   Mental Status: She is alert and oriented to  person, place, and time.  ?   Sensory: No sensory deficit.  ?   Gait: Gait abnormal (antalgic).  ?Psychiatric:     ?   Mood and Affect: Mood normal.     ?   Behavior: Behavior normal.  ? ? ?No results found for any visits on 10/07/21. ? ? ?   ?Assessment & Plan:  ? ?[name] was seen today for back pain. ? ?Diagnoses and all orders for this visit: ? ?Lumbar radiculopathy ?Back pain with history of spinal surgery ?Chronic, worsening over last week. Referral placed today. Will try voltaren as below. Declined imaging today as well as prednisone. Strict return precautions given.  ?-     Ambulatory referral to Neurosurgery ?-     diclofenac (VOLTAREN) 75 MG EC tablet; Take 1 tablet (75 mg total) by mouth 2 (two) times daily. ?-     methocarbamol (ROBAXIN) 750 MG tablet; Take 1 tablet (750 mg total) by mouth 4 (four) times daily. ? ? ?Return if symptoms worsen or fail to improve. ? ?The patient indicates understanding of these issues and agrees with the plan. ? ?Gwenlyn Perking, FNP ? ? ?

## 2021-10-07 NOTE — Patient Instructions (Signed)
Radicular Pain Radicular pain is a type of pain that spreads from your back or neck along a spinal nerve. Spinal nerves are nerves that leave the spinal cord and go to the muscles. Radicular pain is sometimes called radiculopathy, radiculitis, or a pinched nerve. When you have this type of pain, you may also have weakness, numbness, or tingling in the area of your body that is supplied by the nerve. The pain may feel sharp and burning. Depending on which spinal nerve is affected, the pain may occur in the: Neck area (cervical radicular pain). You may also feel pain, numbness, weakness, or tingling in the arms. Mid-spine area (thoracic radicular pain). You would feel this pain in the back and chest. This type is rare. Lower back area (lumbar radicular pain). You would feel this pain as low back pain. You may feel pain, numbness, weakness, or tingling in the buttocks or legs. Sciatica is a type of lumbar radicular pain that shoots down the back of the leg. Radicular pain occurs when one of the spinal nerves becomes irritated or squeezed (compressed). It is often caused by something pushing on a spinal nerve, such as one of the bones of the spine (vertebrae) or one of the round cushions between vertebrae (intervertebral disks). This can result from: An injury. Wear and tear or aging of a disk. The growth of a bone spur that pushes on the nerve. Radicular pain often goes away when you follow instructions from your health care provider for relieving pain at home. How is this treated? Treatment may depend on the cause of the condition and may include: Working with a physical therapist. Taking pain medicine. Applying heat or ice or both to the affected areas. Doing stretches to improve flexibility. Having surgery. This may be needed if other treatments do not help. Different types of surgery may be done depending on the cause of this condition. Follow these instructions at home: Managing pain     If  directed, put ice on the affected area. To do this: Put ice in a plastic bag. Place a towel between your skin and the bag. Leave the ice on for 20 minutes, 2-3 times a day. Remove the ice if your skin turns bright red. This is very important. If you cannot feel pain, heat, or cold, you have a greater risk of damage to the area. If directed, apply heat to the affected area as often as told by your health care provider. Use the heat source that your health care provider recommends, such as a moist heat pack or a heating pad. Place a towel between your skin and the heat source. Leave the heat on for 20-30 minutes. Remove the heat if your skin turns bright red. This is especially important if you are unable to feel pain, heat, or cold. You have a greater risk of getting burned. Activity Do not sit or rest in bed for long periods of time. Try to stay as active as possible. Ask your health care provider what type of exercise or activity is best for you. Avoid activities that make your pain worse, such as bending and lifting. You may have to avoid lifting. Ask your health care provider how much you can safely lift. Practice using proper technique when lifting items. Proper lifting technique involves bending your knees and rising up. Do strength and range-of-motion exercises only as told by your health care provider or physical therapist. General instructions Take over-the-counter and prescription medicines only as told by your   health care provider. Pay attention to any changes in your symptoms. Keep all follow-up visits. This is important. Contact a health care provider if: Your pain and other symptoms get worse. Your pain medicine is not helping. Your pain has not improved after a few weeks of home care. You have a fever. Get help right away if: You have severe pain, weakness, or numbness. You have difficulty with bladder or bowel control. Summary Radicular pain is a type of pain that spreads  from your back or neck along a spinal nerve. When you have radicular pain, you may also have weakness, numbness, or tingling in the area of your body that is supplied by the nerve. The pain may feel sharp or burning. Radicular pain may be treated with ice, heat, medicines, or physical therapy. This information is not intended to replace advice given to you by your health care provider. Make sure you discuss any questions you have with your health care provider. Document Revised: 11/22/2020 Document Reviewed: 11/22/2020 Elsevier Patient Education  2023 Elsevier Inc.  

## 2021-11-10 NOTE — Congregational Nurse Program (Signed)
Pt attended [place]  to complete enrollment into Connect program in addition to requesting becoming established and scheduled with a primary care provider.  Pt last seen a PCP 09/2021 at [facility] for her back pain.    Chief Reasons Needed for  PCP Pt states she has been having severe pain without the relief of prescribed medicine recently received.   States she had received a referral to  neuro surgery but uanble to followup due to cost   -Referral sent to RN Nurse Case Manager, Mayra Reel, for initial and continous medical case management upon completion of first medical appointment   -  Enrollment completed Care Connect Program. By Elnora Morrison. M.  - MedAssist Application Initiated  and emailed     (pending approval) -[place] application initiated and faxed on today  -Scheduled Appointment for first medical visit was scheduled for Monday Oct 11, 2021 @ 8am at the [facility].

## 2021-11-11 ENCOUNTER — Encounter: Payer: Self-pay | Admitting: Physician Assistant

## 2021-11-11 ENCOUNTER — Ambulatory Visit: Payer: Self-pay | Admitting: Physician Assistant

## 2021-11-11 VITALS — BP 118/80 | HR 77 | Temp 97.0°F | Ht 67.25 in | Wt 311.5 lb

## 2021-11-11 DIAGNOSIS — Z1211 Encounter for screening for malignant neoplasm of colon: Secondary | ICD-10-CM

## 2021-11-11 DIAGNOSIS — Z8639 Personal history of other endocrine, nutritional and metabolic disease: Secondary | ICD-10-CM

## 2021-11-11 DIAGNOSIS — Z1239 Encounter for other screening for malignant neoplasm of breast: Secondary | ICD-10-CM

## 2021-11-11 DIAGNOSIS — R002 Palpitations: Secondary | ICD-10-CM

## 2021-11-11 DIAGNOSIS — Z7689 Persons encountering health services in other specified circumstances: Secondary | ICD-10-CM

## 2021-11-11 DIAGNOSIS — Z90710 Acquired absence of both cervix and uterus: Secondary | ICD-10-CM

## 2021-11-11 DIAGNOSIS — Z13220 Encounter for screening for lipoid disorders: Secondary | ICD-10-CM

## 2021-11-11 DIAGNOSIS — M549 Dorsalgia, unspecified: Secondary | ICD-10-CM

## 2021-11-11 DIAGNOSIS — I10 Essential (primary) hypertension: Secondary | ICD-10-CM

## 2021-11-11 DIAGNOSIS — F419 Anxiety disorder, unspecified: Secondary | ICD-10-CM

## 2021-11-11 LAB — POC FIT TEST STOOL: Fecal Occult Blood: NEGATIVE

## 2021-11-11 NOTE — Progress Notes (Signed)
BP 118/80   Pulse 77   Temp (!) 97 F (36.1 C)   Ht 5' 7.25" (1.708 m)   Wt (!) 311 lb 8 oz (141.3 kg)   LMP 02/24/2013   SpO2 97%   BMI 48.43 kg/m    Subjective:    Patient ID: Cheryl Randall, female    DOB: 16-Oct-1975, 46 y.o.   MRN: [id number]  HPI: Cheryl Randall is a 46 y.o. female presenting on 11/11/2021 for New Patient (Initial Visit)   HPI  Chief Complaint  Patient presents with   New Patient (Initial Visit)    Pt is 44yoF who presents to establish care.  She has been going to [facility].  She says she Hasn't worked in years.  She Previously worked in a IT sales professional.    Pt c/o Pain left lower back and radiates into left foot.  Pt has long history of back problems and has undergone 4 back surgeries by neurosurgeon.  Her most recent surgery was in January 2017 due to a surgical site infection.   Prior to that, she had - December 2016- revision of lumbar fusion.  Her first two surgeries were in May 2002 and January 2004.   Notes from 2 more recent surgeries were reviewed.    She has a long history of Depression and anxiety.  She says the celexa works okay but "maybe it needs to be increased".    She was on paxil in the past and didn't do well for her.  She likes the celexa.   She isn't interested in counseling.    She sees the cardiologist annually for palpitations which are well controlled on metoprolol.   Pt says she has No sob or cp.  No abd pain.     Relevant past medical, surgical, family and social history reviewed and updated as indicated. Interim medical history since our last visit reviewed. Allergies and medications reviewed and updated.   Current Outpatient Medications:    citalopram (CELEXA) 40 MG tablet, Take 1 tablet (40 mg total) by mouth daily., Disp: 90 tablet, Rfl: 3   diclofenac (VOLTAREN) 75 MG EC tablet, Take 1 tablet (75 mg total) by mouth 2 (two) times daily., Disp: 60 tablet, Rfl: 1   famotidine (PEPCID) 20 MG tablet, One after supper,  Disp: 30 tablet, Rfl: 11   methocarbamol (ROBAXIN) 750 MG tablet, Take 1 tablet (750 mg total) by mouth 4 (four) times daily., Disp: 120 tablet, Rfl: 1   metoprolol succinate (TOPROL-XL) 50 MG 24 hr tablet, TAKE ONE (1) TABLET EACH DAY (Patient taking differently: Take 50 mg by mouth daily.), Disp: 90 tablet, Rfl: 1   SUMAtriptan (IMITREX) 50 MG tablet, TAKE 1 TABLET AT START OF HEADACHE MAY REPEAT IN 4 HOURS IF NEEDED, Disp: 9 tablet, Rfl: 5   valACYclovir (VALTREX) 1000 MG tablet, Take 1 tablet (1,000 mg total) by mouth 2 (two) times daily. As needed for cold sore flareups, Disp: 20 tablet, Rfl: 0   albuterol (VENTOLIN HFA) 108 (90 Base) MCG/ACT inhaler, Inhale 1-2 puffs into the lungs every 6 (six) hours as needed for wheezing or shortness of breath. (Patient not taking: Reported on 11/11/2021), Disp: 1 each, Rfl: 0   Review of Systems  Per HPI unless specifically indicated above     Objective:    BP 118/80   Pulse 77   Temp (!) 97 F (36.1 C)   Ht 5' 7.25" (1.708 m)   Wt (!) 311 lb 8 oz (141.3  kg)   LMP 02/24/2013   SpO2 97%   BMI 48.43 kg/m   Wt Readings from Last 3 Encounters:  11/11/21 (!) 311 lb 8 oz (141.3 kg)  10/07/21 (!) 304 lb (137.9 kg)  08/15/21 300 lb (136.1 kg)    Physical Exam Vitals reviewed.  Constitutional:      General: She is not in acute distress.    Appearance: She is well-developed. She is obese. She is not toxic-appearing.  HENT:     Head: Normocephalic and atraumatic.     Right Ear: Tympanic membrane, ear canal and external ear normal.     Left Ear: Tympanic membrane, ear canal and external ear normal.  Eyes:     Extraocular Movements: Extraocular movements intact.     Conjunctiva/sclera: Conjunctivae normal.     Pupils: Pupils are equal, round, and reactive to light.  Neck:     Thyroid: No thyromegaly.  Cardiovascular:     Rate and Rhythm: Normal rate and regular rhythm.  Pulmonary:     Effort: Pulmonary effort is normal.     Breath sounds:  Normal breath sounds.  Abdominal:     General: Bowel sounds are normal.     Palpations: Abdomen is soft. There is no mass.     Tenderness: There is no abdominal tenderness.  Musculoskeletal:     Cervical back: Neck supple.     Lumbar back: Tenderness present. Decreased range of motion.     Right lower leg: No edema.     Left lower leg: No edema.     Comments: Large surgical scar lumbar back without redness or inflammation, + tenderness.    Lymphadenopathy:     Cervical: No cervical adenopathy.  Skin:    General: Skin is warm and dry.  Neurological:     Mental Status: She is alert and oriented to person, place, and time.     Gait: Gait normal.  Psychiatric:        Behavior: Behavior normal.            Assessment & Plan:   Encounter Diagnoses  Name Primary?   Encounter to establish care Yes   Back pain with history of spinal surgery    Anxiety and depression    History of diabetes mellitus    Screening for colon cancer    Screening for hyperlipidemia    Morbid obesity ([place])    Encounter for screening for malignant neoplasm of breast, unspecified screening modality    S/P hysterectomy    Primary hypertension    Palpitations       -referred for screening mammogram -She already applied for [facility] FA -will Refer to [facility] -pt is given FIT test for colon cancer screening -will get baseline Labs -no changes to medications today -pt to follow up in a month.  She is to contact office sooner prn

## 2021-11-13 ENCOUNTER — Telehealth: Payer: Self-pay

## 2021-11-13 NOTE — Telephone Encounter (Signed)
Spoke with Ahnycea of [employer] and she stated followed with Diane B. and Diane approved moving forward with sending the [facility] application, now that we have the the pt's bank statements uploaded and that they would be sent on faxed on today.  Plan - Called pt back and confirmed that the [facility] application will be forwarded on today 11/13/21 by Johnnette Barrios .      -Advised pt that I would send her the date and time of when fax was sent to [facility] by text message, in the event she wants to have information when confirming receipt of information by Edmund states she understood and would wait accordingly for her approval by [facility] Dept.

## 2021-11-13 NOTE — Telephone Encounter (Signed)
Pt called to inquire about [facility] CAFA status. Pt was advised that I would follow with up [place] enrollment specialist [facility]/ Johnnette Barrios) regarding her status and will return a call to her to confirm regarding when the application was forwarded or would be forwarded.     Pt states she understood wait to hear back from me on today.

## 2021-11-18 ENCOUNTER — Other Ambulatory Visit (HOSPITAL_COMMUNITY)
Admission: RE | Admit: 2021-11-18 | Discharge: 2021-11-18 | Disposition: A | Discharge status: Home / Self Care | Payer: Self-pay | Source: Ambulatory Visit | Attending: Physician Assistant | Admitting: Physician Assistant

## 2021-11-18 ENCOUNTER — Telehealth: Payer: Self-pay

## 2021-11-18 DIAGNOSIS — Z13220 Encounter for screening for lipoid disorders: Secondary | ICD-10-CM | POA: Insufficient documentation

## 2021-11-18 DIAGNOSIS — Z8639 Personal history of other endocrine, nutritional and metabolic disease: Secondary | ICD-10-CM | POA: Insufficient documentation

## 2021-11-18 LAB — COMPREHENSIVE METABOLIC PANEL
ALT: 25 U/L (ref 0–44)
AST: 22 U/L (ref 15–41)
Albumin: 3.7 g/dL (ref 3.5–5.0)
Alkaline Phosphatase: 60 U/L (ref 38–126)
Anion gap: 6 (ref 5–15)
BUN: 20 mg/dL (ref 6–20)
CO2: 24 mmol/L (ref 22–32)
Calcium: 8.9 mg/dL (ref 8.9–10.3)
Chloride: 107 mmol/L (ref 98–111)
Creatinine, Ser: 0.76 mg/dL (ref 0.44–1.00)
GFR, Estimated: >60 mL/min (ref >60)
Glucose, Bld: 125 mg/dL — ABNORMAL HIGH (ref 70–99)
Potassium: 4.3 mmol/L (ref 3.5–5.1)
Sodium: 137 mmol/L (ref 135–145)
Total Bilirubin: 0.5 mg/dL (ref 0.3–1.2)
Total Protein: 7 g/dL (ref 6.5–8.1)

## 2021-11-18 LAB — LIPID PANEL
Cholesterol: 156 mg/dL (ref 0–200)
HDL: 50 mg/dL (ref >40)
LDL Cholesterol: 85 mg/dL (ref 0–99)
Total CHOL/HDL Ratio: 3.1 RATIO
Triglycerides: 103 mg/dL (ref <150)
VLDL: 21 mg/dL (ref 0–40)

## 2021-11-18 LAB — HEMOGLOBIN A1C
Hgb A1c MFr Bld: 5.9 % — ABNORMAL HIGH (ref 4.8–5.6)
Mean Plasma Glucose: 122.63 mg/dL

## 2021-11-18 NOTE — Telephone Encounter (Signed)
F/u with patient to verify the address she would like her medassist meds delivered when time come for meds to be sent   Pt confirmed to send to [address] mailing address.

## 2021-11-27 NOTE — Progress Notes (Signed)
CARDIOLOGY CONSULT NOTE       Patient ID: Cheryl Randall MRN: [id number] DOB/AGE: Jan 19, 1976 46 y.o.  Primary Physician: Soyla Dryer, PA-C Primary Cardiologist: Johnsie Cancel   HPI: 46 y.o. first seen 08/25/19 with syncope thought due to a vagal reaction at church. Noted some palpitations Monitor 09/21/19 no arrhythmias just short burst PSVT Started on Toprol  Echo 09/26/19 EF 50-55% no valve disease ETT 09/27/19 normal but only reached 78% PMHR She has had some atypical chest pain and family history of premature CAD.   Has 5 brothers and 2 sisters son [name] has Asperger's but working at [employer]   She was seen in ER 09/01/20 for dry cough and dyspnea CTA done with  No PE ? Atelectasis vs early pneumonia Rx with Z pack [name] She has some dependant edema from her obesity   Activity limited by back pain has had 4 surgeries most recently 2017 Uses Celexa for depression She hasn't worked in years   Has consult in [place] end of month for ? 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   Deep incisional surgical site infection 07/01/2015   Diabetes mellitus without complication (HCC)    borderline; diet controlled   Essential hypertension 07/26/2019   Family history of adverse reaction to anesthesia    Patients son has bad N/V   GAD (generalized anxiety disorder) 06/26/2017   Generalized edema 03/24/2017   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   Migraine    Palpitations 07/26/2019   Panic attack 06/26/2017   PONV (postoperative nausea and vomiting)    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   Staphylococcus aureus  bacteremia with sepsis ([place])    Wound infection 06/30/2015    Family History  Problem Relation Age of Onset   Heart disease Mother    Diabetes Mother    Cancer Father        bladder   Autism Brother    Diabetes Brother    Heart disease Brother    Diabetes Brother    Diabetes Brother     Social History   Socioeconomic History   Marital status: Divorced    Spouse name: Not on file   Number of children: Not on file   Years of education: Not on file   Highest education level: Not on file  Occupational History   Not on file  Tobacco Use   Smoking status: Former    Types: Cigarettes    Quit date: 06/02/2014    Years since quitting: 7.5   Smokeless tobacco: Never  Vaping Use   Vaping Use: Never used  Substance and Sexual Activity   Alcohol use: No   Drug use: Not Currently    Types: Marijuana    Comment: none since a teenager   Sexual activity: Not on file  Other Topics Concern   Not on file  Social History Narrative   Not on file   Social Determinants of Health   Financial Resource Strain: Not on file  Food Insecurity: Not on file  Transportation Needs: Not on file  Physical Activity: Not on file  Stress: Not on file  Social Connections: Not on file  Intimate Partner Violence: Not on file    Past Surgical History:  Procedure Laterality Date   ABDOMINAL HYSTERECTOMY     BACK SURGERY     X 2   BREAST SURGERY Bilateral    reduction   CHOLECYSTECTOMY     LUMBAR LAMINECTOMY/DECOMPRESSION MICRODISCECTOMY N/A 06/30/2015   Procedure: wound exploration irrigation and debridement ,explantation of bone growth stimulator, placement of wound vac;  Surgeon: Kevan Ny Ditty, MD;  Location: [place] [facility];  Service: Neurosurgery;  Laterality: N/A;      Current Outpatient Medications:    albuterol (VENTOLIN HFA) 108 (90 Base) MCG/ACT inhaler, Inhale 1-2 puffs into the lungs every 6 (six) hours as needed for wheezing or shortness of breath., Disp: 1 each, Rfl: 0    citalopram (CELEXA) 40 MG tablet, Take 1 tablet (40 mg total) by mouth daily., Disp: 90 tablet, Rfl: 3   diclofenac (VOLTAREN) 75 MG EC tablet, Take 1 tablet (75 mg total) by mouth 2 (two) times daily., Disp: 60 tablet, Rfl: 1   famotidine (PEPCID) 20 MG tablet, One after supper, Disp: 30 tablet, Rfl: 11   methocarbamol (ROBAXIN) 750 MG tablet, Take 1 tablet (750 mg total) by mouth 4 (four) times daily., Disp: 120 tablet, Rfl: 1   metoprolol succinate (TOPROL-XL) 50 MG 24 hr tablet, TAKE ONE (1) TABLET EACH DAY (Patient taking differently: Take 50 mg by mouth daily.), Disp: 90 tablet, Rfl: 1   SUMAtriptan (IMITREX) 50 MG tablet, TAKE 1 TABLET AT START OF HEADACHE MAY REPEAT IN 4 HOURS IF NEEDED, Disp: 9 tablet, Rfl: 5   valACYclovir (VALTREX) 1000 MG tablet, Take 1 tablet (1,000 mg total) by mouth 2 (two) times daily. As needed for cold sore flareups, Disp: 20 tablet, Rfl: 0    Physical Exam: Blood pressure 128/88, pulse 72, height '5\' 7"'$  (1.702 m), weight (!) 308 lb 3.2 oz (139.8 kg), last menstrual period 02/24/2013, SpO2 94 %.    Affect appropriate Healthy:  appears stated age 31: normal Neck supple with no adenopathy JVP normal no bruits no thyromegaly Lungs clear with no wheezing and good diaphragmatic motion Heart:  S1/S2 no murmur, no rub, gallop or click PMI normal Abdomen: benighn, BS positve, no tenderness, no AAA no bruit.  No HSM or HJR Distal pulses intact with no bruits No edema Neuro non-focal Skin warm and dry No muscular weakness   Labs:   Lab Results  Component Value Date   WBC 4.4 08/15/2021   HGB 13.6 08/15/2021   HCT 43.8 08/15/2021   MCV 92.2 08/15/2021   PLT 147 (L) 08/15/2021   No results for input(s): "NA", "K", "CL", "CO2", "BUN", "CREATININE", "CALCIUM", "PROT", "BILITOT", "ALKPHOS", "ALT", "AST", "GLUCOSE" in the last 168 hours.  Invalid input(s): "LABALBU" Lab Results  Component Value Date   TROPONINI <0.03 01/13/2016    Lab Results   Component Value Date   CHOL 156 11/18/2021   CHOL 165 07/26/2019   CHOL 121 11/06/2017   Lab Results  Component Value Date   HDL 50 11/18/2021   HDL 59 07/26/2019   HDL 48 11/06/2017   Lab Results  Component Value Date   LDLCALC 85 11/18/2021   LDLCALC 90 07/26/2019   LDLCALC 62 11/06/2017   Lab Results  Component Value Date   TRIG 103 11/18/2021   TRIG 87 07/26/2019   TRIG 53 11/06/2017   Lab Results  Component Value Date   CHOLHDL 3.1  11/18/2021   CHOLHDL 2.8 07/26/2019   CHOLHDL 2.5 11/06/2017   No results found for: "LDLDIRECT"    Radiology: No results found.  EKG: 07/26/19 SR rate 84 normal    ASSESSMENT AND PLAN:   1. Chest pain; atypical normal ECG CRF;s include Family history and HTN had normal ETT 09/26/19 but sub max exercise Discussed utility of coronary calcium score to further risk stratify  offered last visit as well    2.  HTN:  Well controlled.  Continue current medications and low sodium Dash type diet.   Continue beta blocker   3.  Anxiety: continue Celexa f/u primary   4.  Palpitations :  Monitor with no significant arrhythmia on beta blocker  Toprol to 50 mg daily    5. Pre syncope: likely vagal see above no high risk family history , exam or ECG   6. Dyspnea:  seems functional from deconditioning and obesity 09/01/20 CTA with no PE and Rx for ? Early Pneumonia with Z pack Lungs normal on exam today. Has had normal echo 09/26/19 observe   7. Edema:  dependant from obesity and varicosities f/u primary can consider PRN diuretic   F/U in a year  Coronary calcium score   Signed: Jenkins Rouge 12/02/2021, 10:59 AM

## 2021-12-02 ENCOUNTER — Ambulatory Visit (INDEPENDENT_AMBULATORY_CARE_PROVIDER_SITE_OTHER): Payer: Self-pay | Admitting: Cardiovascular Disease

## 2021-12-02 ENCOUNTER — Encounter: Payer: Self-pay | Admitting: Cardiovascular Disease

## 2021-12-02 VITALS — BP 128/88 | HR 72 | Ht 67.0 in | Wt 308.2 lb

## 2021-12-02 DIAGNOSIS — R079 Chest pain, unspecified: Secondary | ICD-10-CM

## 2021-12-02 DIAGNOSIS — Z8249 Family history of ischemic heart disease and other diseases of the circulatory system: Secondary | ICD-10-CM

## 2021-12-02 DIAGNOSIS — I10 Essential (primary) hypertension: Secondary | ICD-10-CM

## 2021-12-02 MED ORDER — METOPROLOL SUCCINATE ER 50 MG PO TB24: 50.0000 mg | ORAL_TABLET | Freq: Every day | ORAL | 3 refills | Status: DC

## 2021-12-02 NOTE — Patient Instructions (Signed)
Medication Instructions:  Your physician recommends that you continue on your current medications as directed. Please refer to the Current Medication list given to you today.   *If you need a refill on your cardiac medications before your next appointment, please call your pharmacy*   Lab Work: NONE   If you have labs (blood work) drawn today and your tests are completely normal, you will receive your results only by: [place] (if you have MyChart) OR A paper copy in the mail If you have any lab test that is abnormal or we need to change your treatment, we will call you to review the results.   Testing/Procedures: Calcium Score    Follow-Up: At [facility], you and your health needs are our priority.  As part of our continuing mission to provide you with exceptional heart care, we have created designated Provider Care Teams.  These Care Teams include your primary Cardiologist (physician) and Advanced Practice Providers (APPs -  Physician Assistants and Nurse Practitioners) who all work together to provide you with the care you need, when you need it.  We recommend signing up for the patient portal called "MyChart".  Sign up information is provided on this After Visit Summary.  MyChart is used to connect with patients for Virtual Visits (Telemedicine).  Patients are able to view lab/test results, encounter notes, upcoming appointments, etc.  Non-urgent messages can be sent to your provider as well.   To learn more about what you can do with MyChart, go to [email or web address].    Your next appointment:   1 year(s)  The format for your next appointment:   In Person  Provider:   Jenkins Rouge, MD    Other Instructions Thank you for choosing [facility]!    Important Information About Sugar

## 2021-12-04 ENCOUNTER — Other Ambulatory Visit: Payer: Self-pay | Admitting: Family Medicine

## 2021-12-04 DIAGNOSIS — M5416 Radiculopathy, lumbar region: Secondary | ICD-10-CM

## 2021-12-04 DIAGNOSIS — M549 Dorsalgia, unspecified: Secondary | ICD-10-CM

## 2021-12-09 ENCOUNTER — Ambulatory Visit: Payer: Self-pay | Admitting: Physician Assistant

## 2021-12-09 ENCOUNTER — Encounter: Payer: Self-pay | Admitting: Physician Assistant

## 2021-12-09 VITALS — BP 118/78 | HR 80 | Temp 96.7°F | Wt 308.0 lb

## 2021-12-09 DIAGNOSIS — R002 Palpitations: Secondary | ICD-10-CM

## 2021-12-09 DIAGNOSIS — R7303 Prediabetes: Secondary | ICD-10-CM

## 2021-12-09 DIAGNOSIS — M549 Dorsalgia, unspecified: Secondary | ICD-10-CM

## 2021-12-09 DIAGNOSIS — I10 Essential (primary) hypertension: Secondary | ICD-10-CM

## 2021-12-09 DIAGNOSIS — F32A Depression, unspecified: Secondary | ICD-10-CM

## 2021-12-09 MED ORDER — METOPROLOL TARTRATE 50 MG PO TABS: 50.0000 mg | ORAL_TABLET | Freq: Two times a day (BID) | ORAL | 0 refills | Status: DC

## 2021-12-09 MED ORDER — RIZATRIPTAN BENZOATE 5 MG PO TABS: 5.0000 mg | ORAL_TABLET | ORAL | 0 refills | Status: DC | PRN

## 2021-12-09 MED ORDER — ACYCLOVIR 400 MG PO TABS: 400.0000 mg | ORAL_TABLET | Freq: Three times a day (TID) | ORAL | 0 refills | Status: DC

## 2021-12-09 MED ORDER — OMEPRAZOLE 40 MG PO CPDR: 40.0000 mg | DELAYED_RELEASE_CAPSULE | Freq: Every day | ORAL | 0 refills | Status: DC

## 2021-12-09 MED ORDER — CITALOPRAM HYDROBROMIDE 40 MG PO TABS: 40.0000 mg | ORAL_TABLET | Freq: Every day | ORAL | 0 refills | Status: DC

## 2021-12-09 NOTE — Progress Notes (Signed)
BP 118/78   Pulse 80   Temp (!) 96.7 F (35.9 C)   Wt (!) 308 lb (139.7 kg)   LMP 02/24/2013   SpO2 97%   BMI 48.24 kg/m    Subjective:    Patient ID: Cheryl Randall, female    DOB: 1975-11-02, 46 y.o.   MRN: [id number]  HPI: Cheryl Randall is a 46 y.o. female presenting on 12/09/2021 for No chief complaint on file.   HPI   Pt is 30yoF presenting to her follow up to new pt appointment last month.  She saw cardiologist last week.  She was ordered to get cadiac CT/ coronary calcium testing  She has appointment with [facility] neurosurgery 12/26/21.     Relevant past medical, surgical, family and social history reviewed and updated as indicated. Interim medical history since our last visit reviewed. Allergies and medications reviewed and updated.    Current Outpatient Medications:    acetaminophen (TYLENOL) 500 MG tablet, Take 500 mg by mouth every 6 (six) hours as needed., Disp: , Rfl:    albuterol (VENTOLIN HFA) 108 (90 Base) MCG/ACT inhaler, Inhale 1-2 puffs into the lungs every 6 (six) hours as needed for wheezing or shortness of breath., Disp: 1 each, Rfl: 0   citalopram (CELEXA) 40 MG tablet, Take 1 tablet (40 mg total) by mouth daily., Disp: 90 tablet, Rfl: 3   diclofenac (VOLTAREN) 75 MG EC tablet, Take 1 tablet (75 mg total) by mouth 2 (two) times daily. (NEEDS TO BE SEEN BEFORE NEXT REFILL), Disp: 60 tablet, Rfl: 0   methocarbamol (ROBAXIN) 750 MG tablet, Take 1 tablet (750 mg total) by mouth 4 (four) times daily., Disp: 120 tablet, Rfl: 1   metoprolol succinate (TOPROL-XL) 50 MG 24 hr tablet, Take 1 tablet (50 mg total) by mouth daily. TAKE ONE (1) TABLET EACH DAY Strength: 50 mg, Disp: 90 tablet, Rfl: 3   omeprazole (PRILOSEC OTC) 20 MG tablet, Take 20 mg by mouth daily., Disp: , Rfl:    SUMAtriptan (IMITREX) 50 MG tablet, TAKE 1 TABLET AT START OF HEADACHE MAY REPEAT IN 4 HOURS IF NEEDED, Disp: 9 tablet, Rfl: 5   valACYclovir (VALTREX) 1000 MG tablet, Take 1 tablet  (1,000 mg total) by mouth 2 (two) times daily. As needed for cold sore flareups, Disp: 20 tablet, Rfl: 0   famotidine (PEPCID) 20 MG tablet, One after supper (Patient not taking: Reported on 12/09/2021), Disp: 30 tablet, Rfl: 11   ibuprofen (ADVIL) 800 MG tablet, Take 800 mg by mouth every 8 (eight) hours as needed. (Patient not taking: Reported on 12/09/2021), Disp: , Rfl:     Review of Systems  Per HPI unless specifically indicated above     Objective:    BP 118/78   Pulse 80   Temp (!) 96.7 F (35.9 C)   Wt (!) 308 lb (139.7 kg)   LMP 02/24/2013   SpO2 97%   BMI 48.24 kg/m   Wt Readings from Last 3 Encounters:  12/09/21 (!) 308 lb (139.7 kg)  12/02/21 (!) 308 lb 3.2 oz (139.8 kg)  11/11/21 (!) 311 lb 8 oz (141.3 kg)    Physical Exam Vitals reviewed.  Constitutional:      General: She is not in acute distress.    Appearance: She is well-developed. She is obese. She is not toxic-appearing.  HENT:     Head: Normocephalic and atraumatic.  Cardiovascular:     Rate and Rhythm: Normal rate and regular rhythm.  Pulmonary:  Effort: Pulmonary effort is normal.     Breath sounds: Normal breath sounds.  Abdominal:     General: Bowel sounds are normal.     Palpations: Abdomen is soft. There is no mass.     Tenderness: There is no abdominal tenderness.  Musculoskeletal:     Cervical back: Neck supple.     Right lower leg: No edema.     Left lower leg: No edema.  Lymphadenopathy:     Cervical: No cervical adenopathy.  Skin:    General: Skin is warm and dry.  Neurological:     Mental Status: She is alert and oriented to person, place, and time.  Psychiatric:        Behavior: Behavior normal.     Results for orders placed or performed during the hospital encounter of 11/18/21  Lipid panel  Result Value Ref Range   Cholesterol 156 0 - 200 mg/dL   Triglycerides 103 <150 mg/dL   HDL 50 >40 mg/dL   Total CHOL/HDL Ratio 3.1 RATIO   VLDL 21 0 - 40 mg/dL   LDL  Cholesterol 85 0 - 99 mg/dL  Comprehensive metabolic panel  Result Value Ref Range   Sodium 137 135 - 145 mmol/L   Potassium 4.3 3.5 - 5.1 mmol/L   Chloride 107 98 - 111 mmol/L   CO2 24 22 - 32 mmol/L   Glucose, Bld 125 (H) 70 - 99 mg/dL   BUN 20 6 - 20 mg/dL   Creatinine, Ser 0.76 0.44 - 1.00 mg/dL   Calcium 8.9 8.9 - 10.3 mg/dL   Total Protein 7.0 6.5 - 8.1 g/dL   Albumin 3.7 3.5 - 5.0 g/dL   AST 22 15 - 41 U/L   ALT 25 0 - 44 U/L   Alkaline Phosphatase 60 38 - 126 U/L   Total Bilirubin 0.5 0.3 - 1.2 mg/dL   GFR, Estimated >60 >60 mL/min   Anion gap 6 5 - 15  Hemoglobin A1c  Result Value Ref Range   Hgb A1c MFr Bld 5.9 (H) 4.8 - 5.6 %   Mean Plasma Glucose 122.63 mg/dL      Assessment & Plan:    Encounter Diagnoses  Name Primary?   Primary hypertension Yes   Back pain with history of spinal surgery    Anxiety and depression    Prediabetes    Palpitations    Morbid obesity ([place])       -Reviewed labs with pt -pt was counseled on Prediabetes and strategies to reduce risk of progression to diabetes.  She was given handout -LBP- she has appt with NS 12/26/21.  Recommended lidocaine patches to help with discomfort.   Recommended she let neurosurgeon know that the diclofenac and robaxin aren't helping -checked on financial assistance.  She was approved for [facility] and the letter is in the mail.  She doesn't have cone assistance.  Encouraged her to get assistance with application from [employer]; she was given their phone number/address -pt was counseled about medications ie taking nsaids with food and avoiding IBU with diclofenac.  Some of her medications were changed to correlate with available medications at [facility] -discussed strategies to help with weight reduction/need to stop sodas.  Warned on headaches with stopping suddenly.  Discussed that stopping them could help the palpitations as well.   -screening mammogram has been ordered.  Awaiting processing through funding  program -spent 45+ min with pt -pt will RTO 1 month to make sure things are continuing to  move along, correct some family history in her chart and make sure she has no questions with her new meds.  She is to contact office sooner prn

## 2021-12-09 NOTE — Patient Instructions (Addendum)
-we will check on [employer] financial assistance and [facility] financial assistance This patient was approved [employer] on 12/07/21.  Her approval letter was printed today so she should receive it soon. There was an error on the original eligibility card and we reissued a corrected one. -go to [employer] to apply for [employer] financial assistance  -go to neurosurgery at [facility] on 12/26/21 as scheduled -take diclofenac with food (to prevent stomach pain) -take omeprazole/prilosec 2 hours before or after other meds -lidocaine patches may help your back pain    -------------------------------------------   Prediabetes Prediabetes is when your blood sugar (blood glucose) level is higher than normal but not high enough for you to be diagnosed with type 2 diabetes. Having prediabetes puts you at risk for developing type 2 diabetes (type 2 diabetes mellitus). With certain lifestyle changes, you may be able to prevent or delay the onset of type 2 diabetes. This is important because type 2 diabetes can lead to serious complications, such as: Heart disease. Stroke. Blindness. Kidney disease. Depression. Poor circulation in the feet and legs. In severe cases, this could lead to surgical removal of a leg (amputation). What are the causes? The exact cause of prediabetes is not known. It may result from insulin resistance. Insulin resistance develops when cells in the body do not respond properly to insulin that the body makes. This can cause excess glucose to build up in the blood. High blood glucose (hyperglycemia) can develop. What increases the risk? The following factors may make you more likely to develop this condition: You have a family member with type 2 diabetes. You are older than 45 years. You had a temporary form of diabetes during a pregnancy (gestational diabetes). You had polycystic ovary syndrome (PCOS). You are overweight or obese. You are inactive (sedentary). You  have a history of heart disease, including problems with cholesterol levels, high levels of blood fats, or high blood pressure. What are the signs or symptoms? You may have no symptoms. If you do have symptoms, they may include: Increased hunger. Increased thirst. Increased urination. Vision changes, such as blurry vision. Tiredness (fatigue). How is this diagnosed? This condition can be diagnosed with blood tests. Your blood glucose may be checked with one or more of the following tests: A fasting blood glucose (FBG) test. You will not be allowed to eat (you will fast) for at least 8 hours before a blood sample is taken. An A1C blood test (hemoglobin A1C). This test provides information about blood glucose levels over the previous 2?3 months. An oral glucose tolerance test (OGTT). This test measures your blood glucose at two points in time: After fasting. This is your baseline level. Two hours after you drink a beverage that contains glucose. You may be diagnosed with prediabetes if: Your FBG is 100?125 mg/dL (5.6-6.9 mmol/L). Your A1C level is 5.7?6.4% (39-46 mmol/mol). Your OGTT result is 140?199 mg/dL (7.8-11 mmol/L). These blood tests may be repeated to confirm your diagnosis. How is this treated? Treatment may include dietary and lifestyle changes to help lower your blood glucose and prevent type 2 diabetes from developing. In some cases, medicine may be prescribed to help lower the risk of type 2 diabetes. Follow these instructions at home: Nutrition  Follow a healthy meal plan. This includes eating lean proteins, whole grains, legumes, fresh fruits and vegetables, low-fat dairy products, and healthy fats. Follow instructions from your health care provider about eating or drinking restrictions. Meet with a dietitian to create  a healthy eating plan that is right for you. Lifestyle Do moderate-intensity exercise for at least 30 minutes a day on 5 or more days each week, or as told  by your health care provider. A mix of activities may be best, such as: Brisk walking, swimming, biking, and weight lifting. Lose weight as told by your health care provider. Losing 5-7% of your body weight can reverse insulin resistance. Do not drink alcohol if: Your health care provider tells you not to drink. You are pregnant, may be pregnant, or are planning to become pregnant. If you drink alcohol: Limit how much you use to: 0-1 drink a day for women. 0-2 drinks a day for men. Be aware of how much alcohol is in your drink. In the [place] one drink equals one 12 oz bottle of beer (355 mL), one 5 oz glass of wine (148 mL), or one 1 oz glass of hard liquor (44 mL). General instructions Take over-the-counter and prescription medicines only as told by your health care provider. You may be prescribed medicines that help lower the risk of type 2 diabetes. Do not use any products that contain nicotine or tobacco, such as cigarettes, e-cigarettes, and chewing tobacco. If you need help quitting, ask your health care provider. Keep all follow-up visits. This is important. Where to find more information American Diabetes Association: www.diabetes.org Academy of Nutrition and Dietetics: www.eatright.org American Heart Association: www.heart.org Contact a health care provider if: You have any of these symptoms: Increased hunger. Increased urination. Increased thirst. Fatigue. Vision changes, such as blurry vision. Get help right away if you: Have shortness of breath. Feel confused. Vomit or feel like you may vomit. Summary Prediabetes is when your blood sugar (blood glucose)level is higher than normal but not high enough for you to be diagnosed with type 2 diabetes. Having prediabetes puts you at risk for developing type 2 diabetes (type 2 diabetes mellitus). Make lifestyle changes such as eating a healthy diet and exercising regularly to help prevent diabetes. Lose weight as told by your  health care provider. This information is not intended to replace advice given to you by your health care provider. Make sure you discuss any questions you have with your health care provider. Document Revised: 08/18/2019 Document Reviewed: 08/18/2019 Elsevier Patient Education  Cheryl Randall.

## 2021-12-10 ENCOUNTER — Other Ambulatory Visit: Payer: Self-pay | Admitting: Physician Assistant

## 2021-12-10 ENCOUNTER — Telehealth: Payer: Self-pay | Admitting: Student

## 2021-12-10 MED ORDER — PANTOPRAZOLE SODIUM 40 MG PO TBEC: 40.0000 mg | DELAYED_RELEASE_TABLET | Freq: Every day | ORAL | 3 refills | Status: DC

## 2021-12-10 NOTE — Telephone Encounter (Signed)
LPN called pt to notify that omeprazole is being changed to pantoprazole as omeprazole interacts with her citalopram. Pt verbalized understanding and requests her medication be sent to [facility].  Rx sent 12-10-21

## 2021-12-24 ENCOUNTER — Other Ambulatory Visit (HOSPITAL_COMMUNITY): Payer: Self-pay

## 2021-12-31 ENCOUNTER — Other Ambulatory Visit: Payer: Self-pay | Admitting: Physician Assistant

## 2021-12-31 DIAGNOSIS — Z9889 Other specified postprocedural states: Secondary | ICD-10-CM

## 2022-01-01 ENCOUNTER — Ambulatory Visit: Payer: Self-pay | Admitting: Family Medicine

## 2022-01-15 ENCOUNTER — Encounter: Payer: Self-pay | Admitting: Physician Assistant

## 2022-01-15 ENCOUNTER — Ambulatory Visit: Payer: Self-pay | Admitting: Physician Assistant

## 2022-01-15 VITALS — BP 106/70 | HR 71 | Temp 96.1°F | Ht 67.0 in | Wt 310.0 lb

## 2022-01-15 DIAGNOSIS — D492 Neoplasm of unspecified behavior of bone, soft tissue, and skin: Secondary | ICD-10-CM

## 2022-01-15 DIAGNOSIS — F419 Anxiety disorder, unspecified: Secondary | ICD-10-CM

## 2022-01-15 DIAGNOSIS — M5416 Radiculopathy, lumbar region: Secondary | ICD-10-CM

## 2022-01-15 DIAGNOSIS — R079 Chest pain, unspecified: Secondary | ICD-10-CM

## 2022-01-15 DIAGNOSIS — M549 Dorsalgia, unspecified: Secondary | ICD-10-CM

## 2022-01-15 DIAGNOSIS — I10 Essential (primary) hypertension: Secondary | ICD-10-CM

## 2022-01-15 DIAGNOSIS — F32A Depression, unspecified: Secondary | ICD-10-CM

## 2022-01-15 DIAGNOSIS — Z8249 Family history of ischemic heart disease and other diseases of the circulatory system: Secondary | ICD-10-CM

## 2022-01-15 MED ORDER — METHOCARBAMOL 750 MG PO TABS: 750.0000 mg | ORAL_TABLET | Freq: Three times a day (TID) | ORAL | 0 refills | Status: DC | PRN

## 2022-01-15 NOTE — Progress Notes (Addendum)
BP 106/70   Pulse 71   Temp (!) 96.1 F (35.6 C)   Ht 5\' 7"  (1.702 m)   Wt (!) 310 lb (140.6 kg)   LMP 02/24/2013   SpO2 97%   BMI 48.55 kg/m    Subjective:    Patient ID: Cheryl Randall, female    DOB: July 26, 1975, 46 y.o.   MRN: [id number]  HPI: TORREY Randall is a 46 y.o. female presenting on 01/15/2022 for No chief complaint on file.   HPI   Pt is seen today for routine follow up.  She has been seen by Neurosurgery for her lumbar radiculopathy and  has CT and MRI appts this w/e and virtual f/u with NS next week  She did not try the lidocaine patches recommended for pain.   she is still using robaxin and diclofenac which aren't helping.  She has not cut back on her sodas as discussed.  She is concerned about a black spot on her left shoulder that she says has been getting larger.    She didn't get her coronary CT done yet because she just got her approval letter for cafa/[employer] financial assistance.    Relevant past medical, surgical, family and social history reviewed and updated as indicated. Interim medical history since our last visit reviewed. Allergies and medications reviewed and updated.    Current Outpatient Medications:    acetaminophen (TYLENOL) 500 MG tablet, Take 500 mg by mouth every 6 (six) hours as needed., Disp: , Rfl:    acyclovir (ZOVIRAX) 400 MG tablet, Take 1 tablet (400 mg total) by mouth 3 (three) times daily. For 5 days at first sign of cold sores flare, Disp: 30 tablet, Rfl: 0   albuterol (VENTOLIN HFA) 108 (90 Base) MCG/ACT inhaler, Inhale 1-2 puffs into the lungs every 6 (six) hours as needed for wheezing or shortness of breath., Disp: 1 each, Rfl: 0   citalopram (CELEXA) 40 MG tablet, Take 1 tablet (40 mg total) by mouth daily. For mood, Disp: 90 tablet, Rfl: 0   diclofenac (VOLTAREN) 75 MG EC tablet, Take 1 tablet (75 mg total) by mouth 2 (two) times daily. (NEEDS TO BE SEEN BEFORE NEXT REFILL), Disp: 60 tablet, Rfl: 0    methocarbamol (ROBAXIN) 750 MG tablet, Take 1 tablet (750 mg total) by mouth 4 (four) times daily., Disp: 120 tablet, Rfl: 1   metoprolol tartrate (LOPRESSOR) 50 MG tablet, Take 1 tablet (50 mg total) by mouth 2 (two) times daily. For heart, Disp: 180 tablet, Rfl: 0   omeprazole (PRILOSEC) 20 MG capsule, Take 20 mg by mouth daily., Disp: , Rfl:    rizatriptan (MAXALT) 5 MG tablet, Take 1 tablet (5 mg total) by mouth as needed for migraine. May repeat in 2 hours if needed (max  doses in 24 hours), Disp: 10 tablet, Rfl: 0   ibuprofen (ADVIL) 800 MG tablet, Take 800 mg by mouth every 8 (eight) hours as needed. (Patient not taking: Reported on 12/09/2021), Disp: , Rfl:    metoprolol succinate (TOPROL-XL) 50 MG 24 hr tablet, Take 1 tablet (50 mg total) by mouth daily. TAKE ONE (1) TABLET EACH DAY Strength: 50 mg (Patient not taking: Reported on 01/15/2022), Disp: 90 tablet, Rfl: 3   pantoprazole (PROTONIX) 40 MG tablet, Take 1 tablet (40 mg total) by mouth daily. (Patient not taking: Reported on 01/15/2022), Disp: 30 tablet, Rfl: 3   SUMAtriptan (IMITREX) 50 MG tablet, TAKE 1 TABLET AT START OF HEADACHE MAY REPEAT IN  4 HOURS IF NEEDED (Patient not taking: Reported on 01/15/2022), Disp: 9 tablet, Rfl: 5   valACYclovir (VALTREX) 1000 MG tablet, Take 1 tablet (1,000 mg total) by mouth 2 (two) times daily. As needed for cold sore flareups (Patient not taking: Reported on 01/15/2022), Disp: 20 tablet, Rfl: 0    Review of Systems  Per HPI unless specifically indicated above     Objective:    BP 106/70   Pulse 71   Temp (!) 96.1 F (35.6 C)   Ht 5\' 7"  (1.702 m)   Wt (!) 310 lb (140.6 kg)   LMP 02/24/2013   SpO2 97%   BMI 48.55 kg/m   Wt Readings from Last 3 Encounters:  01/15/22 (!) 310 lb (140.6 kg)  12/09/21 (!) 308 lb (139.7 kg)  12/02/21 (!) 308 lb 3.2 oz (139.8 kg)    Physical Exam Constitutional:      General: She is not in acute distress.    Appearance: She is obese. She is not  toxic-appearing.  HENT:     Head: Normocephalic and atraumatic.  Cardiovascular:     Rate and Rhythm: Normal rate and regular rhythm.  Pulmonary:     Effort: Pulmonary effort is normal. No respiratory distress.     Breath sounds: Normal breath sounds.  Skin:    General: Skin is warm and dry.     Comments: 5mm black/brown smooth lesion on back/left shoulder.  Lesion is not raised or waxy  Neurological:     Mental Status: She is alert and oriented to person, place, and time.  Psychiatric:        Behavior: Behavior normal.          Assessment & Plan:     Encounter Diagnoses  Name Primary?   Lumbar radiculopathy    Back pain with history of spinal surgery    Abnormal skin growth Yes   Chest pain, unspecified type    Family history of early CAD    Primary hypertension    Anxiety and depression      -She got cafa -Cardiac test was rescheduled for her -Will refer To dermatology to check spot on back- -for her Back pain-   recommend she try the lidocaine patches and voltaren gel.   Discussed that she should discontinue the oral diclofenac since it isn't working and she's having GI issues -her screening mammogram has been ordered (due to delay/issues with funding program) -pt to continue with neurosurgery as scheduled -pt to follow up 2 months.  She is to contact office sooner prn

## 2022-01-15 NOTE — Patient Instructions (Addendum)
If you need to reschedule your heart CT appointment call [phone]

## 2022-01-28 ENCOUNTER — Inpatient Hospital Stay (HOSPITAL_COMMUNITY): Admission: RE | Admit: 2022-01-28 | Payer: Self-pay | Source: Ambulatory Visit

## 2022-02-04 ENCOUNTER — Other Ambulatory Visit (HOSPITAL_COMMUNITY): Payer: Self-pay | Admitting: Obstetrics and Gynecology

## 2022-02-04 DIAGNOSIS — Z1231 Encounter for screening mammogram for malignant neoplasm of breast: Secondary | ICD-10-CM

## 2022-02-28 ENCOUNTER — Ambulatory Visit (HOSPITAL_COMMUNITY): Payer: Self-pay

## 2022-02-28 ENCOUNTER — Inpatient Hospital Stay: Payer: Self-pay | Attending: Obstetrics and Gynecology | Admitting: Hematology and Oncology

## 2022-02-28 VITALS — BP 124/93 | Wt 303.3 lb

## 2022-02-28 DIAGNOSIS — N644 Mastodynia: Secondary | ICD-10-CM

## 2022-02-28 NOTE — Progress Notes (Signed)
Ms. Cheryl Randall is a 46 y.o. female who presents to [facility] clinic today with complaint of bilateral breast pain.    Pap Smear: Pap not smear completed today. Hysterectomy 2003 for benign reasons.   Physical exam: Breasts Breasts symmetrical. No skin abnormalities bilateral breasts. No nipple retraction bilateral breasts. No nipple discharge bilateral breasts. No lymphadenopathy. Right breast with 2 cm lump noted at 9 o'clock.     Pelvic/Bimanual Pap is not indicated today    Smoking History: Patient has is a former smoker and was not referred to quit line.    Patient Navigation: Patient education provided. Access to services provided for patient through [facility] program. No interpreter provided. No transportation provided   Colorectal Cancer Screening: Per patient has never had colonoscopy completed No complaints today. FIT test negative 11/11/21 from [facility].   Breast and Cervical Cancer Risk Assessment: Patient has family history of breast cancer with her maternal aunt. Patient does not have history of cervical dysplasia, immunocompromised, or DES exposure in-utero.  Risk Assessment   No risk assessment data     A: [facility] exam without pap smear Complaint of bilateral breast pain. Was recommended follow up imaging back in 2014 and patient was unable to receive treatment due to loss of insurance. We will cancel screening mammogram today and schedule for diagnostic mammogram next week. Patient verbalizes understanding.   P: [facility]. Appointment to be scheduled and patient will be notified by Kathi Der with [facility] radiology.   Melodye Ped, NP 02/28/2022 12:23 PM

## 2022-03-03 ENCOUNTER — Other Ambulatory Visit (HOSPITAL_COMMUNITY): Payer: Self-pay | Admitting: Obstetrics and Gynecology

## 2022-03-03 DIAGNOSIS — N6310 Unspecified lump in the right breast, unspecified quadrant: Secondary | ICD-10-CM

## 2022-03-06 ENCOUNTER — Ambulatory Visit (HOSPITAL_COMMUNITY)
Admission: RE | Admit: 2022-03-06 | Discharge: 2022-03-06 | Disposition: A | Discharge status: Home / Self Care | Payer: Self-pay | Source: Ambulatory Visit | Attending: Obstetrics and Gynecology | Admitting: Obstetrics and Gynecology

## 2022-03-06 DIAGNOSIS — N6310 Unspecified lump in the right breast, unspecified quadrant: Secondary | ICD-10-CM

## 2022-03-10 ENCOUNTER — Other Ambulatory Visit: Payer: Self-pay | Admitting: Physician Assistant

## 2022-03-12 ENCOUNTER — Telehealth: Payer: Self-pay | Admitting: Student

## 2022-03-12 NOTE — Telephone Encounter (Signed)
LPN called pt to notify that unfortunately the referral to [facility] was denied due to "pt does not currently meet criteria for these services". Pt was reminded she has a f/u with PA on the 24th and can discuss other options on that day. Pt verbalized understanding.

## 2022-03-25 ENCOUNTER — Ambulatory Visit: Payer: Self-pay | Admitting: Physician Assistant

## 2022-03-25 ENCOUNTER — Encounter: Payer: Self-pay | Admitting: Physician Assistant

## 2022-03-25 VITALS — BP 102/74 | HR 89 | Temp 97.8°F | Wt 305.0 lb

## 2022-03-25 DIAGNOSIS — M5416 Radiculopathy, lumbar region: Secondary | ICD-10-CM

## 2022-03-25 DIAGNOSIS — Z9889 Other specified postprocedural states: Secondary | ICD-10-CM

## 2022-03-25 DIAGNOSIS — F32A Depression, unspecified: Secondary | ICD-10-CM

## 2022-03-25 DIAGNOSIS — I10 Essential (primary) hypertension: Secondary | ICD-10-CM

## 2022-03-25 NOTE — Progress Notes (Unsigned)
BP 102/74   Pulse 89   Temp 97.8 F (36.6 C)   Wt (!) 305 lb (138.3 kg)   LMP 02/24/2013   SpO2 98%   BMI 47.77 kg/m    Subjective:    Patient ID: Cheryl Randall, female    DOB: [birth year], 46 y.o.   MRN: [id number]  HPI: Cheryl Randall is a 46 y.o. female presenting on 03/25/2022 for Follow-up   HPI   She missed her cards test Seh went to derm She got mammo and [name] She says the lidocaine patches helps some.  She stopped the diclofinac due to it wasn't.   The voltaren gel isn't elping.        Relevant past medical, surgical, family and social history reviewed and updated as indicated. Interim medical history since our last visit reviewed. Allergies and medications reviewed and updated.   Current Outpatient Medications:    acetaminophen (TYLENOL) 500 MG tablet, Take 500 mg by mouth every 6 (six) hours as needed., Disp: , Rfl:    acyclovir (ZOVIRAX) 400 MG tablet, Take 1 tablet (400 mg total) by mouth 3 (three) times daily. For 5 days at first sign of cold sores flare, Disp: 30 tablet, Rfl: 0   albuterol (VENTOLIN HFA) 108 (90 Base) MCG/ACT inhaler, Inhale 1-2 puffs into the lungs every 6 (six) hours as needed for wheezing or shortness of breath., Disp: 1 each, Rfl: 0   citalopram (CELEXA) 40 MG tablet, TAKE 1 Tablet BY MOUTH ONCE DAILY FOR MOOD, Disp: 90 tablet, Rfl: 0   lidocaine (ASPERCREME LIDOCAINE) 4 %, Place 1 patch onto the skin daily., Disp: , Rfl:    methocarbamol (ROBAXIN) 750 MG tablet, Take 1 tablet (750 mg total) by mouth every 8 (eight) hours as needed for muscle spasms., Disp: 90 tablet, Rfl: 0   metoprolol tartrate (LOPRESSOR) 50 MG tablet, TAKE 1 Tablet  BY MOUTH TWICE DAILY FOR HEART, Disp: 180 tablet, Rfl: 0   omeprazole (PRILOSEC) 20 MG capsule, Take 20 mg by mouth daily., Disp: , Rfl:    rizatriptan (MAXALT) 5 MG tablet, TAKE 1 TABLET BY MOUTH AT ONSET OF HEADACHE, MAY REPEAT IN 2 HOURS. MAX 2 TABLETS IN 24 HOURS., Disp: 10 tablet, Rfl: 0   diclofenac  (VOLTAREN) 75 MG EC tablet, Take 1 tablet (75 mg total) by mouth 2 (two) times daily. (NEEDS TO BE SEEN BEFORE NEXT REFILL) (Patient not taking: Reported on 03/25/2022), Disp: 60 tablet, Rfl: 0   ibuprofen (ADVIL) 800 MG tablet, Take 800 mg by mouth every 8 (eight) hours as needed. (Patient not taking: Reported on 12/09/2021), Disp: , Rfl:    metoprolol succinate (TOPROL-XL) 50 MG 24 hr tablet, Take 1 tablet (50 mg total) by mouth daily. TAKE ONE (1) TABLET EACH DAY Strength: 50 mg (Patient not taking: Reported on 03/25/2022), Disp: 90 tablet, Rfl: 3   pantoprazole (PROTONIX) 40 MG tablet, Take 1 tablet (40 mg total) by mouth daily. (Patient not taking: Reported on 01/15/2022), Disp: 30 tablet, Rfl: 3   SUMAtriptan (IMITREX) 50 MG tablet, TAKE 1 TABLET AT START OF HEADACHE MAY REPEAT IN 4 HOURS IF NEEDED (Patient not taking: Reported on 01/15/2022), Disp: 9 tablet, Rfl: 5   valACYclovir (VALTREX) 1000 MG tablet, Take 1 tablet (1,000 mg total) by mouth 2 (two) times daily. As needed for cold sore flareups (Patient not taking: Reported on 01/15/2022), Disp: 20 tablet, Rfl: 0   Review of Systems  Per HPI unless specifically indicated above  Objective:    BP 102/74   Pulse 89   Temp 97.8 F (36.6 C)   Wt (!) 305 lb (138.3 kg)   LMP 02/24/2013   SpO2 98%   BMI 47.77 kg/m   Wt Readings from Last 3 Encounters:  03/25/22 (!) 305 lb (138.3 kg)  02/28/22 (!) 303 lb 4.8 oz (137.6 kg)  01/15/22 (!) 310 lb (140.6 kg)    Physical Exam        Assessment & Plan:     Get path report from derm  To r/s card test Refer to pain mgt in [place]- she declines

## 2022-03-25 NOTE — Patient Instructions (Addendum)
-  Call to reschedule your cardiac testing - [phone]

## 2022-04-01 ENCOUNTER — Encounter: Payer: Self-pay | Admitting: Physician Assistant

## 2022-04-17 ENCOUNTER — Ambulatory Visit (HOSPITAL_COMMUNITY): Payer: Self-pay

## 2022-05-20 ENCOUNTER — Telehealth: Payer: Self-pay

## 2022-05-20 ENCOUNTER — Other Ambulatory Visit: Payer: Self-pay | Admitting: Physician Assistant

## 2022-05-20 MED ORDER — RIZATRIPTAN BENZOATE 5 MG PO TABS: ORAL_TABLET | ORAL | 0 refills | Status: DC

## 2022-05-20 MED ORDER — METOPROLOL TARTRATE 50 MG PO TABS: ORAL_TABLET | ORAL | 0 refills | Status: DC

## 2022-05-20 MED ORDER — CITALOPRAM HYDROBROMIDE 40 MG PO TABS: ORAL_TABLET | ORAL | 0 refills | Status: DC

## 2022-05-20 NOTE — Telephone Encounter (Signed)
Patient aware and verbalizes understanding. 

## 2022-05-20 NOTE — Telephone Encounter (Signed)
Yes, that's fine but the wait might be a bit before she can get in. I think I'm booking out through Feb right now.

## 2022-05-20 NOTE — Telephone Encounter (Signed)
Patient used to see Dr. Darnell Level.  Last seen this year in this office.  Had to switch to the free clinic but now she has Medicaid. Would like to know if she can come back to Dr. Darnell Level?

## 2022-05-22 ENCOUNTER — Encounter: Payer: Self-pay | Admitting: Student in an Organized Health Care Education/Training Program

## 2022-05-22 ENCOUNTER — Ambulatory Visit
Payer: Medicaid Other | Attending: Student in an Organized Health Care Education/Training Program | Admitting: Student in an Organized Health Care Education/Training Program

## 2022-05-22 ENCOUNTER — Other Ambulatory Visit: Payer: Self-pay | Admitting: Family Medicine

## 2022-05-22 ENCOUNTER — Other Ambulatory Visit: Payer: Self-pay | Admitting: Physician Assistant

## 2022-05-22 VITALS — BP 142/90 | HR 71 | Temp 97.0°F | Ht 67.0 in | Wt 300.0 lb

## 2022-05-22 DIAGNOSIS — M5137 Other intervertebral disc degeneration, lumbosacral region: Secondary | ICD-10-CM | POA: Insufficient documentation

## 2022-05-22 DIAGNOSIS — M5416 Radiculopathy, lumbar region: Secondary | ICD-10-CM | POA: Insufficient documentation

## 2022-05-22 DIAGNOSIS — S32009K Unspecified fracture of unspecified lumbar vertebra, subsequent encounter for fracture with nonunion: Secondary | ICD-10-CM | POA: Insufficient documentation

## 2022-05-22 DIAGNOSIS — G8929 Other chronic pain: Secondary | ICD-10-CM | POA: Diagnosis not present

## 2022-05-22 DIAGNOSIS — M4626 Osteomyelitis of vertebra, lumbar region: Secondary | ICD-10-CM

## 2022-05-22 DIAGNOSIS — M961 Postlaminectomy syndrome, not elsewhere classified: Secondary | ICD-10-CM | POA: Insufficient documentation

## 2022-05-22 MED ORDER — PREGABALIN 50 MG PO CAPS: ORAL_CAPSULE | ORAL | 0 refills | Status: DC

## 2022-05-22 NOTE — Progress Notes (Signed)
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 06/30/2015   Procedure: wound exploration irrigation and debridement ,explantation of bone growth stimulator, placement of wound vac;  Surgeon: Kevan Ny Ditty, MD;  Location: [facility];  Service: Neurosurgery;  Laterality: N/A;   Active Ambulatory Problems    Diagnosis Date Noted   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   Deep incisional surgical site infection 07/01/2015   DDD (degenerative disc disease), lumbosacral 11/24/2016   Chronic pain syndrome 11/25/2016   Generalized edema 03/24/2017   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   Panic attack 06/26/2017   GAD (generalized anxiety disorder) 06/26/2017   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[place]) 08/21/2017   Essential hypertension 07/26/2019   Palpitations 07/26/2019   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   URI with cough and congestion 03/26/2021   Dysuria 05/03/2021   Upper airway cough syndrome 05/30/2021    Morbid obesity due to excess calories ([place]) 05/30/2021   Resolved Ambulatory Problems    Diagnosis Date Noted   Wound infection 06/30/2015   Staphylococcus aureus bacteremia with sepsis [facility])    Past Medical History:  Diagnosis Date   Anxiety    DDD (degenerative disc disease)    Diabetes mellitus without complication ([place])    Family history of adverse reaction to anesthesia    Migraine    PONV (postoperative nausea and vomiting)    Constitutional Exam  General appearance: Well nourished, well developed, and well hydrated. In no apparent acute distress Vitals:   05/22/22 1131  BP: (!) 142/90  Pulse: 71  Temp: (!) 97 F (36.1 C)  TempSrc: Temporal  SpO2: 99%  Weight: 300 lb (136.1 kg)  Height: _0  (1.702 m)   BMI Assessment: Estimated body mass index is 46.99 kg/m as calculated from the following:   Height as of this encounter: _1  (1.702 m).   Weight as of this encounter: 300 lb (136.1 kg).  BMI  interpretation table: BMI level Category Range association with higher incidence of chronic pain  <18 kg/m2 Underweight   18.5-24.9 kg/m2 Ideal body weight   25-29.9 kg/m2 Overweight Increased incidence by 20%  30-34.9 kg/m2 Obese (Class I) Increased incidence by 68%  35-39.9 kg/m2 Severe obesity (Class II) Increased incidence by 136%  >40 kg/m2 Extreme obesity (Class III) Increased incidence by 254%   Patient's current BMI Ideal Body weight  Body mass index is 46.99 kg/m. Ideal body weight: 61.6 kg (135 lb 12.9 oz) Adjusted ideal body weight: 91.4 kg (201 lb 7.7 oz)   BMI Readings from Last 4 Encounters:  05/22/22 46.99 kg/m  03/25/22 47.77 kg/m  02/28/22 47.50 kg/m  01/15/22 48.55 kg/m   Wt Readings from Last 4 Encounters:  05/22/22 300 lb (136.1 kg)  03/25/22 (!) 305 lb (138.3 kg)  02/28/22 (!) 303 lb 4.8 oz (137.6 kg)  01/15/22 (!) 310 lb (140.6 kg)    Psych/Mental status: Alert, oriented x 3 (person, place, & time)       Eyes: PERLA Respiratory:  No evidence of acute respiratory distress  Lumbar Spine Area Exam  Skin & Axial Inspection: Well healed scar from previous spine surgery detected Alignment: Symmetrical Functional ROM: Pain restricted ROM affecting primarily the left Stability: No instability detected Muscle Tone/Strength: Functionally intact. No obvious neuro-muscular anomalies detected. Sensory (Neurological): Dermatomal pain pattern left L3, L4, L5  Gait & Posture Assessment  Ambulation: Unassisted Gait: Relatively normal for age and body habitus Posture: WNL  Lower Extremity Exam    Side: Right lower extremity  Side: Left lower extremity  Stability: No instability observed          Stability: No instability observed          Skin & Extremity Inspection: Skin color, temperature, and hair growth are WNL. No peripheral edema or cyanosis. No masses, redness, swelling, asymmetry, or associated skin lesions. No contractures.  Skin & Extremity Inspection: Skin color, temperature, and hair growth are WNL. No peripheral edema or cyanosis. No masses, redness, swelling, asymmetry, or associated skin lesions. No contractures.  Functional ROM: Unrestricted ROM                  Functional ROM: Pain restricted ROM for hip and knee joints          Muscle Tone/Strength: Functionally intact. No obvious neuro-muscular anomalies detected.  Muscle Tone/Strength: Movement possible against some resistance (4/5)  Sensory (Neurological): Unimpaired        Sensory (Neurological): Dermatomal pain pattern        DTR: Patellar: deferred today Achilles: deferred today Plantar: deferred today  DTR: Patellar: deferred today Achilles: deferred today Plantar: deferred today  Palpation: No palpable anomalies  Palpation: No palpable anomalies  4 out of 5 strength LEFT lower extremity: Plantar flexion, dorsiflexion, knee flexion, knee extension. 5 out of 5 strength RIGHT lower extremity: Plantar flexion, dorsiflexion, knee flexion, knee  extension.    Assessment  Primary Diagnosis & Pertinent Problem List: The primary encounter diagnosis was Pseudoarthrosis of lumbar spine. 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  Visit Diagnosis (New problems to examiner): 1. Pseudoarthrosis of lumbar spine   2. DDD (degenerative disc disease), lumbosacral   3. 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 Failed back surgical syndrome   5. Chronic radicular pain of lower back    Plan  of Care (Initial workup plan)  I reminded the patient that I focus primarily on interventional pain management therapies.  She has exhausted various spinal injections as well as spinal cord stimulation.  She has chronic persistent left lumbar radicular pain in the context of having 4 prior lumbar spine surgeries.  I suspect that she also has significant scar tissue that contributes to her radicular pain at the neuroforamen.  I informed her that I will make recommendations for Lyrica that she can continue with her primary care provider for neuropathic pain management.  Titration instructions provided below.  Future recommendations include Cymbalta however this would need to be weighed in the context of her being on Celexa and her risk of serotonin syndrome.  She can also consider low-dose TCA such as amitriptyline or nortriptyline for neuropathic pain adjunct again this would need to be weighed in the context of her being on Celexa and the risk of serotonin syndrome.  I have also encouraged her to utilize ibuprofen or Aleve as this is a nonsedating medication.  Discussed TENS unit to her lower back as well as bracing to help with lumbar spine stability.  Encouraged her to discuss weight loss strategies with her primary care provider as this is also likely contributing to her radicular pain  symptoms.  Pharmacotherapy (current): Medications ordered:  Meds ordered this encounter  Medications   pregabalin (LYRICA) 50 MG capsule    Sig: Take 1 capsule (50 mg total) by mouth daily for 30 days, THEN 1 capsule (50 mg total) 2 (two) times daily for 30 days, THEN 1 capsule (50 mg total) 3 (three) times daily.    Dispense:  180 capsule    Refill:  0    Fill one day early if pharmacy is closed on scheduled refill date. May substitute for generic if available.   Medications administered during this visit: Cheryl Randall had no medications administered during this visit.    Provider-requested follow-up: Return for follow up with PCP for continued management.  No future appointments.   Duration of encounter: 60mnutes.  Total time on encounter, as per AMA guidelines included both the face-to-face and non-face-to-face time personally spent by the physician and/or other qualified health care professional(s) on the day of the encounter (includes time in activities that require the physician or other qualified health care professional and does not include time in activities normally performed by clinical staff). Physician's time may include the following activities when performed: Preparing to see the patient (e.g., pre-charting review of records, searching for previously ordered imaging, lab work, and nerve conduction tests) Review of prior analgesic pharmacotherapies. Reviewing PMP Interpreting ordered tests (e.g., lab work, imaging, nerve conduction tests) Performing post-procedure evaluations, including interpretation of diagnostic procedures Obtaining and/or reviewing separately obtained history Performing a medically appropriate examination and/or evaluation Counseling and educating the patient/family/caregiver Ordering medications, tests, or procedures Referring and communicating with other health care professionals (when not separately reported) Documenting clinical information  in the electronic or other health record Independently interpreting results (not separately reported) and communicating results to the patient/ family/caregiver Care coordination (not separately reported)  Note by: BGillis Santa MD Date: 05/22/2022; Time: 12:10 PM

## 2022-05-22 NOTE — Progress Notes (Signed)
Safety precautions to be maintained throughout the outpatient stay will include: orient to surroundings, keep bed in low position, maintain call bell within reach at all times, provide assistance with transfer out of bed and ambulation.  

## 2022-05-23 ENCOUNTER — Other Ambulatory Visit: Payer: Self-pay | Admitting: Physician Assistant

## 2022-05-23 MED ORDER — SUMATRIPTAN SUCCINATE 50 MG PO TABS: 50.0000 mg | ORAL_TABLET | Freq: Every day | ORAL | 0 refills | Status: DC | PRN

## 2022-05-23 NOTE — Progress Notes (Signed)
Call to pharmacy after receiving request for maxalt.  Pharmacist states that pt was wanting imitrex due to the [place] doesn't work for her.

## 2022-06-12 NOTE — Telephone Encounter (Signed)
Apt scheduled for March with Lajuana Ripple. I cannot schedule new pt visit type, will let Gottschalk's nurse know.  Pt also says that she will be out of her meds the end of Jan. I advised her to call previous pcp to ask for refills till apt.

## 2022-06-17 DIAGNOSIS — H5213 Myopia, bilateral: Secondary | ICD-10-CM | POA: Diagnosis not present

## 2022-06-19 ENCOUNTER — Telehealth (INDEPENDENT_AMBULATORY_CARE_PROVIDER_SITE_OTHER): Payer: Medicaid Other | Admitting: Family Medicine

## 2022-06-19 ENCOUNTER — Encounter: Payer: Self-pay | Admitting: Family Medicine

## 2022-06-19 DIAGNOSIS — J329 Chronic sinusitis, unspecified: Secondary | ICD-10-CM | POA: Diagnosis not present

## 2022-06-19 LAB — VERITOR FLU A/B WAIVED
Influenza A: NEGATIVE
Influenza B: NEGATIVE

## 2022-06-19 MED ORDER — CETIRIZINE HCL 10 MG PO TABS: 10.0000 mg | ORAL_TABLET | Freq: Every day | ORAL | 11 refills | Status: DC

## 2022-06-19 MED ORDER — PREDNISONE 10 MG (21) PO TBPK: ORAL_TABLET | ORAL | 0 refills | Status: DC

## 2022-06-19 NOTE — Progress Notes (Signed)
MyChart Video visit  Subjective: CC:URI PCP: Janora Norlander, DO XLK:[id number] Cheryl Randall is a 47 y.o. female. Patient provides verbal consent for consult held via video.  Due to COVID-19 pandemic this visit was conducted virtually. This visit type was conducted due to national recommendations for restrictions regarding the COVID-19 Pandemic (e.g. social distancing, sheltering in place) in an effort to limit this patient's exposure and mitigate transmission in our community. All issues noted in this document were discussed and addressed.  A physical exam was not performed with this format.   Location of patient: home Location of provider: [facility] Others present for call: none  1. URI Pressure reports sinus pressure, dental pain, and she is sneezing.  She reports productive cough.  Onset today.  Phlegm is clear and bloody.  When she uses nasal sprays go right down her throat.  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   Deep incisional surgical site infection 07/01/2015   Diabetes mellitus without complication ([place])    borderline; diet controlled   Essential hypertension 07/26/2019   Family history of adverse reaction to anesthesia    Patients son has bad N/V   GAD (generalized anxiety disorder) 06/26/2017   Generalized edema 03/24/2017   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)    Wound infection 06/30/2015    Current  Outpatient Medications:    acetaminophen (TYLENOL) 500 MG tablet, Take 500 mg by mouth every 6 (six) hours as needed., Disp: , Rfl:    acyclovir (ZOVIRAX) 400 MG tablet, Take 1 tablet (400 mg total) by mouth 3 (three) times daily. For 5 days at first sign of cold sores flare, Disp: 30 tablet, Rfl: 0   albuterol (VENTOLIN HFA) 108 (90 Base) MCG/ACT inhaler, Inhale 1-2 puffs into the lungs every 6 (six) hours as needed for wheezing or shortness of breath., Disp: 1 each, Rfl: 0   citalopram (CELEXA) 40 MG tablet, TAKE 1 Tablet BY MOUTH ONCE DAILY FOR MOOD, Disp: 30 tablet, Rfl: 0   lidocaine (ASPERCREME LIDOCAINE) 4 %, Place 1 patch onto the skin daily., Disp: , Rfl:    methocarbamol (ROBAXIN) 750 MG tablet, Take 1 tablet (750 mg total) by mouth every 8 (eight) hours as needed for muscle spasms., Disp: 90 tablet, Rfl: 0   metoprolol tartrate (LOPRESSOR) 50 MG tablet, TAKE 1 Tablet  BY MOUTH TWICE DAILY FOR HEART, Disp: 60 tablet, Rfl: 0   omeprazole (PRILOSEC) 20 MG capsule, Take 20 mg by mouth daily., Disp: , Rfl:    pregabalin (LYRICA) 50 MG capsule, Take 1 capsule (50 mg total) by mouth daily for 30 days, THEN 1 capsule (50 mg total) 2 (two) times daily for 30 days, THEN 1 capsule (50 mg total) 3 (three) times daily., Disp: 180 capsule, Rfl: 0   rizatriptan (MAXALT) 5 MG tablet, TAKE 1 TABLET BY MOUTH AT ONSET OF HEADACHE, MAY REPEAT IN 2 HOURS. MAX  2 TABLETS IN 24 HOURS., Disp: 6 tablet, Rfl: 0   SUMAtriptan (IMITREX) 50 MG tablet, Take 1 tablet (50 mg total) by mouth daily as needed for migraine. May repeat in 4 hours if headache persists or recurs., Disp: 6 tablet, Rfl: 0  Assessment/ Plan: 47 y.o. female   Rhinosinusitis - Plan: predniSONE (STERAPRED UNI-PAK 21 TAB) 10 MG (21) TBPK tablet, Veritor Flu A/B Waived, Novel Coronavirus, NAA ([facility]), cetirizine (ZYRTEC) 10 MG tablet  Check for viral infection.  Discussed signs of bacterial infection and reasons for reevaluation.  Start oral  prednisone and Zyrtec since cannot tolerate nasal sprays.    Start time: 11:45am End time: 11:54a  Total time spent on patient care (including video visit/ documentation): 9 minutes  [place], [place] [phone]

## 2022-06-20 ENCOUNTER — Other Ambulatory Visit: Payer: Self-pay | Admitting: Family Medicine

## 2022-06-20 DIAGNOSIS — U071 COVID-19: Secondary | ICD-10-CM

## 2022-06-20 LAB — NOVEL CORONAVIRUS, NAA: SARS-CoV-2, NAA: DETECTED — AB

## 2022-06-20 MED ORDER — MOLNUPIRAVIR EUA 200MG CAPSULE: 4.0000 | ORAL_CAPSULE | Freq: Two times a day (BID) | ORAL | 0 refills | Status: AC

## 2022-06-20 MED ORDER — PROMETHAZINE-DM 6.25-15 MG/5ML PO SYRP: 2.5000 mL | ORAL_SOLUTION | Freq: Four times a day (QID) | ORAL | 0 refills | Status: DC | PRN

## 2022-06-20 NOTE — Telephone Encounter (Signed)
MEDICATION WAS SENT TO PHARMACY PT AWARE BY PCP

## 2022-06-25 ENCOUNTER — Telehealth: Payer: Self-pay | Admitting: Family Medicine

## 2022-06-25 ENCOUNTER — Ambulatory Visit: Payer: Self-pay | Admitting: Physician Assistant

## 2022-06-25 NOTE — Telephone Encounter (Signed)
She has COVID and unfortunately, this is VERY typical.     - Get plenty of rest and drink plenty of fluids. - Try to breathe moist air. Use a cold mist humidifier. - Consume warm fluids (soup or tea) to provide relief for a stuffy nose and to loosen phlegm. - For nasal stuffiness, try saline nasal spray or a Neti Pot.  Afrin nasal spray can also be used but this product should not be used longer than 3 days or it will cause rebound nasal stuffiness (worsening nasal congestion). - For sore throat pain relief: use chloraseptic spray, suck on throat lozenges, hard candy or popsicles; gargle with warm salt water (1/4 tsp. salt per 8 oz. of water); and eat soft, bland foods. - Eat a well-balanced diet. If you cannot, ensure you are getting enough nutrients by taking a daily multivitamin. - Avoid dairy products, as they can thicken phlegm. - Avoid alcohol, as it impairs your body's immune system.  CONTACT YOUR DOCTOR IF YOU EXPERIENCE ANY OF THE FOLLOWING: - High fever - Ear pain - Sinus-type headache - Unusually severe cold symptoms - Cough that gets worse while other cold symptoms improve - Flare up of any chronic lung problem, such as asthma - Your symptoms persist longer than 2 weeks

## 2022-06-25 NOTE — Telephone Encounter (Signed)
Pt called to let Dr Lajuana Ripple know that she still has a lot of head congestion and needs advise on what to do or is there something Dr Lajuana Ripple can send in for her?

## 2022-06-26 ENCOUNTER — Telehealth: Payer: Self-pay | Admitting: Family Medicine

## 2022-06-26 MED ORDER — METOPROLOL TARTRATE 50 MG PO TABS: ORAL_TABLET | ORAL | 0 refills | Status: DC

## 2022-06-26 MED ORDER — CITALOPRAM HYDROBROMIDE 40 MG PO TABS: ORAL_TABLET | ORAL | 0 refills | Status: DC

## 2022-06-26 NOTE — Telephone Encounter (Signed)
  Prescription Request  06/26/2022  Is this a "Controlled Substance" medicine?  citalopram (CELEXA) 40 MG tablet  metoprolol tartrate (LOPRESSOR) 50 MG tablet   Have you seen your PCP in the last 2 weeks? No pt has apt 08/04/2022. She tried to get a refill from [facility] previous provider but was told that she had to call our office since she changed pcp.  If YES, route message to pool  -  If NO, patient needs to be scheduled for appointment.  What is the name of the medication or equipment?  citalopram (CELEXA) 40 MG tablet  metoprolol tartrate (LOPRESSOR) 50 MG tablet   Have you contacted your pharmacy to request a refill? no   Which pharmacy would you like this sent to? [facility]   Patient notified that their request is being sent to the clinical staff for review and that they should receive a response within 2 business days.

## 2022-06-26 NOTE — Telephone Encounter (Signed)
Meds sent to pharmacy, patient aware

## 2022-07-07 ENCOUNTER — Encounter: Payer: Self-pay | Admitting: Family

## 2022-07-07 ENCOUNTER — Ambulatory Visit (INDEPENDENT_AMBULATORY_CARE_PROVIDER_SITE_OTHER): Payer: Medicaid Other | Admitting: Family

## 2022-07-07 VITALS — BP 121/77 | HR 76 | Temp 95.7°F | Ht 67.0 in

## 2022-07-07 DIAGNOSIS — Z8616 Personal history of COVID-19: Secondary | ICD-10-CM

## 2022-07-07 DIAGNOSIS — R058 Other specified cough: Secondary | ICD-10-CM

## 2022-07-07 MED ORDER — PROMETHAZINE-DM 6.25-15 MG/5ML PO SYRP: 5.0000 mL | ORAL_SOLUTION | Freq: Three times a day (TID) | ORAL | 0 refills | Status: DC | PRN

## 2022-07-07 MED ORDER — PREDNISONE 20 MG PO TABS: 40.0000 mg | ORAL_TABLET | Freq: Every day | ORAL | 0 refills | Status: AC

## 2022-07-07 MED ORDER — ALBUTEROL SULFATE HFA 108 (90 BASE) MCG/ACT IN AERS: 1.0000 | INHALATION_SPRAY | Freq: Four times a day (QID) | RESPIRATORY_TRACT | 0 refills | Status: AC | PRN

## 2022-07-07 NOTE — Progress Notes (Signed)
Subjective:    Patient ID: Cheryl Randall, female    DOB: [birth year], 47 y.o.   MRN: [id number]  Chief Complaint  Patient presents with   Cough    WAS DX WITH COVID WEEKS AGO WANTS SOMETHING FOR COUGH    Pt presents to the office today with cough. She reports she had COVID since 06/20/22. She has since continued to have a tight nonproductive cough. States she has tightness in her chest with deep breath.  Cough This is a new problem. The current episode started 1 to 4 weeks ago. The problem has been unchanged. The problem occurs every few minutes. The cough is Non-productive. Associated symptoms include nasal congestion, postnasal drip, rhinorrhea, shortness of breath and wheezing. Pertinent negatives include no chills, ear congestion, ear pain, fever, headaches or myalgias. She has tried rest for the symptoms. The treatment provided mild relief. Her past medical history is significant for asthma. There is no history of COPD.      Review of Systems  Constitutional:  Negative for chills and fever.  HENT:  Positive for postnasal drip and rhinorrhea. Negative for ear pain.   Respiratory:  Positive for cough, shortness of breath and wheezing.   Musculoskeletal:  Negative for myalgias.  Neurological:  Negative for headaches.  All other systems reviewed and are negative.      Objective:   Physical Exam Vitals reviewed.  Constitutional:      General: She is not in acute distress.    Appearance: She is well-developed. She is obese.  HENT:     Head: Normocephalic and atraumatic.  Eyes:     Pupils: Pupils are equal, round, and reactive to light.  Neck:     Thyroid: No thyromegaly.  Cardiovascular:     Rate and Rhythm: Normal rate and regular rhythm.     Heart sounds: Normal heart sounds. No murmur heard. Pulmonary:     Effort: Pulmonary effort is normal. No respiratory distress.     Breath sounds: Normal breath sounds. No wheezing.     Comments: Tight nonproductive  cough Abdominal:     General: Bowel sounds are normal. There is no distension.     Palpations: Abdomen is soft.     Tenderness: There is no abdominal tenderness.  Musculoskeletal:        General: No tenderness. Normal range of motion.     Cervical back: Normal range of motion and neck supple.  Skin:    General: Skin is warm and dry.  Neurological:     Mental Status: She is alert and oriented to person, place, and time.     Cranial Nerves: No cranial nerve deficit.     Deep Tendon Reflexes: Reflexes are normal and symmetric.  Psychiatric:        Behavior: Behavior normal.        Thought Content: Thought content normal.        Judgment: Judgment normal.       BP 121/77   Pulse 76   Temp (!) 95.7 F (35.4 C) (Temporal)   Ht '5\' 7"'$  (1.702 m)   LMP 02/24/2013   SpO2 99%   BMI 46.99 kg/m      Assessment & Plan:  HADEN CAVENAUGH comes in today with chief complaint of Cough (WAS DX WITH COVID WEEKS AGO WANTS SOMETHING FOR COUGH )   Diagnosis and orders addressed:  1. History of COVID-19 - albuterol (VENTOLIN HFA) 108 (90 Base) MCG/ACT inhaler; Inhale 1-2 puffs into the  lungs every 6 (six) hours as needed for wheezing or shortness of breath.  Dispense: 1 each; Refill: 0 - promethazine-dextromethorphan (PROMETHAZINE-DM) 6.25-15 MG/5ML syrup; Take 5 mLs by mouth 3 (three) times daily as needed for cough.  Dispense: 473 mL; Refill: 0 - predniSONE (DELTASONE) 20 MG tablet; Take 2 tablets (40 mg total) by mouth daily with breakfast for 5 days.  Dispense: 10 tablet; Refill: 0  2. Upper airway cough syndrome - Take meds as prescribed - Use a cool mist humidifier  -Use saline nose sprays frequently -Force fluids -For any cough or congestion  Use plain Mucinex- regular strength or max strength is fine -For fever or aces or pains- take tylenol or ibuprofen. -Throat lozenges if help -Follow up if symptoms worsen or do not improve  - albuterol (VENTOLIN HFA) 108 (90 Base) MCG/ACT  inhaler; Inhale 1-2 puffs into the lungs every 6 (six) hours as needed for wheezing or shortness of breath.  Dispense: 1 each; Refill: 0 - promethazine-dextromethorphan (PROMETHAZINE-DM) 6.25-15 MG/5ML syrup; Take 5 mLs by mouth 3 (three) times daily as needed for cough.  Dispense: 473 mL; Refill: 0 - predniSONE (DELTASONE) 20 MG tablet; Take 2 tablets (40 mg total) by mouth daily with breakfast for 5 days.  Dispense: 10 tablet; Refill: 0   Evelina Dun, FNP

## 2022-07-07 NOTE — Patient Instructions (Signed)
Asthma, Adult ? ?Asthma is a long-term (chronic) condition that causes recurrent episodes in which the lower airways in the lungs become tight and narrow. The narrowing is caused by inflammation and tightening of the smooth muscle around the lower airways. ?Asthma episodes, also called asthma attacks or asthma flares, may cause coughing, making high-pitched whistling sounds when you breathe, most often when you breathe out (wheezing), shortness of breath, and chest pain. The airways may produce extra mucus caused by the inflammation and irritation. During an attack, it can be difficult to breathe. Asthma attacks can range from minor to life-threatening. ?Asthma cannot be cured, but medicines and lifestyle changes can help control it and treat acute attacks. It is important to keep your asthma well controlled so the condition does not interfere with your daily life. ?What are the causes? ?This condition is believed to be caused by inherited (genetic) and environmental factors, but its exact cause is not known. ?What can trigger an asthma attack? ?Many things can bring on an asthma attack or make symptoms worse. These triggers are different for every person. Common triggers include: ?Allergens and irritants like mold, dust, pet dander, cockroaches, pollen, air pollution, and chemical odors. ?Cigarette smoke. ?Weather changes and cold air. ?Stress and strong emotional responses such as crying or laughing hard. ?Certain medications such as aspirin or beta blockers. ?Infections and inflammatory conditions, such as the flu, a cold, pneumonia, or inflammation of the nasal membranes (rhinitis). ?Gastroesophageal reflux disease (GERD). ?What are the signs or symptoms? ?Symptoms may occur right after exposure to an asthma trigger or hours later and can vary by person. Common signs and symptoms include: ?Wheezing. ?Trouble breathing (shortness of breath). ?Excessive nighttime or early morning coughing. ?Chest  tightness. ?Tiredness (fatigue) with minimal activity. ?Difficulty talking in complete sentences. ?Poor exercise tolerance. ?How is this diagnosed? ?This condition is diagnosed based on: ?A physical exam and your medical history. ?Tests, which may include: ?Lung function studies to evaluate the flow of air in your lungs. ?Allergy tests. ?Imaging tests, such as X-rays. ?How is this treated? ?There is no cure, but symptoms can be controlled with proper treatment. Treatment usually involves: ?Identifying and avoiding your asthma triggers. ?Inhaled medicines. Two types are commonly used to treat asthma, depending on severity: ?Controller medicines. These help prevent asthma symptoms from occurring. They are taken every day. ?Fast-acting reliever or rescue medicines. These quickly relieve asthma symptoms. They are used as needed and provide short-term relief. ?Using other medicines, such as: ?Allergy medicines, such as antihistamines, if your asthma attacks are triggered by allergens. ?Immune medicines (immunomodulators). These are medicines that help control the immune system. ?Using supplemental oxygen. This is only needed during a severe episode. ?Creating an asthma action plan. An asthma action plan is a written plan for managing and treating your asthma attacks. This plan includes: ?A list of your asthma triggers and how to avoid them. ?Information about when medicines should be taken and when their dosage should be changed. ?Instructions about using a device called a peak flow meter. A peak flow meter measures how well the lungs are working and the severity of your asthma. It helps you monitor your condition. ?Follow these instructions at home: ?Take over-the-counter and prescription medicines only as told by your health care provider. ?Stay up to date on all vaccinations as recommended by your healthcare provider, including vaccines for the flu and pneumonia. ?Use a peak flow meter and keep track of your peak flow  readings. ?Understand and use your asthma   action plan to address any asthma flares. ?Do not smoke or allow anyone to smoke in your home. ?Contact a health care provider if: ?You have wheezing, shortness of breath, or a cough that is not responding to medicines. ?Your medicines are causing side effects, such as a rash, itching, swelling, or trouble breathing. ?You need to use a reliever medicine more than 2-3 times a week. ?Your peak flow reading is still at 50-79% of your personal best after following your action plan for 1 hour. ?You have a fever and shortness of breath. ?Get help right away if: ?You are getting worse and do not respond to treatment during an asthma attack. ?You are short of breath when at rest or when doing very little physical activity. ?You have difficulty eating, drinking, or talking. ?You have chest pain or tightness. ?You develop a fast heartbeat or palpitations. ?You have a bluish color to your lips or fingernails. ?You are light-headed or dizzy, or you faint. ?Your peak flow reading is less than 50% of your personal best. ?You feel too tired to breathe normally. ?These symptoms may be an emergency. Get help right away. Call 911. ?Do not wait to see if the symptoms will go away. ?Do not drive yourself to the hospital. ?Summary ?Asthma is a long-term (chronic) condition that causes recurrent episodes in which the airways become tight and narrow. Asthma episodes, also called asthma attacks or asthma flares, can cause coughing, wheezing, shortness of breath, and chest pain. ?Asthma cannot be cured, but medicines and lifestyle changes can help keep it well controlled and prevent asthma flares. ?Make sure you understand how to avoid triggers and how and when to use your medicines. ?Asthma attacks can range from minor to life-threatening. Get help right away if you have an asthma attack and do not respond to treatment with your usual rescue medicines. ?This information is not intended to replace  advice given to you by your health care provider. Make sure you discuss any questions you have with your health care provider. ?Document Revised: 03/06/2021 Document Reviewed: 02/25/2021 ?Elsevier Patient Education ? 2023 Elsevier Inc. ? ?

## 2022-07-14 DIAGNOSIS — K0381 Cracked tooth: Secondary | ICD-10-CM | POA: Diagnosis not present

## 2022-07-15 ENCOUNTER — Telehealth: Payer: Self-pay | Admitting: Family Medicine

## 2022-07-15 NOTE — Telephone Encounter (Signed)
I don't have any paperwork for her so I'm not really sure what the question is.  Yes, she is my patient. We did a video visit in January

## 2022-07-16 NOTE — Telephone Encounter (Signed)
Pt called back today & she wants nurse to call her back today bc she has to let them know tomorrow.

## 2022-08-04 ENCOUNTER — Encounter: Payer: Self-pay | Admitting: Family Medicine

## 2022-08-04 ENCOUNTER — Ambulatory Visit: Payer: Medicaid Other | Admitting: Family Medicine

## 2022-08-04 DIAGNOSIS — Z79899 Other long term (current) drug therapy: Secondary | ICD-10-CM | POA: Diagnosis not present

## 2022-08-04 DIAGNOSIS — G43009 Migraine without aura, not intractable, without status migrainosus: Secondary | ICD-10-CM

## 2022-08-04 DIAGNOSIS — I10 Essential (primary) hypertension: Secondary | ICD-10-CM

## 2022-08-04 DIAGNOSIS — M549 Dorsalgia, unspecified: Secondary | ICD-10-CM

## 2022-08-04 DIAGNOSIS — E119 Type 2 diabetes mellitus without complications: Secondary | ICD-10-CM

## 2022-08-04 DIAGNOSIS — F418 Other specified anxiety disorders: Secondary | ICD-10-CM | POA: Diagnosis not present

## 2022-08-04 DIAGNOSIS — Z9889 Other specified postprocedural states: Secondary | ICD-10-CM | POA: Diagnosis not present

## 2022-08-04 DIAGNOSIS — G894 Chronic pain syndrome: Secondary | ICD-10-CM | POA: Diagnosis not present

## 2022-08-04 DIAGNOSIS — Z1211 Encounter for screening for malignant neoplasm of colon: Secondary | ICD-10-CM | POA: Diagnosis not present

## 2022-08-04 DIAGNOSIS — R7303 Prediabetes: Secondary | ICD-10-CM | POA: Diagnosis not present

## 2022-08-04 DIAGNOSIS — K219 Gastro-esophageal reflux disease without esophagitis: Secondary | ICD-10-CM

## 2022-08-04 DIAGNOSIS — M5416 Radiculopathy, lumbar region: Secondary | ICD-10-CM | POA: Diagnosis not present

## 2022-08-04 DIAGNOSIS — M961 Postlaminectomy syndrome, not elsewhere classified: Secondary | ICD-10-CM

## 2022-08-04 LAB — BAYER DCA HB A1C WAIVED: HB A1C (BAYER DCA - WAIVED): 7.1 % — ABNORMAL HIGH (ref 4.8–5.6)

## 2022-08-04 MED ORDER — SUMATRIPTAN SUCCINATE 50 MG PO TABS: 50.0000 mg | ORAL_TABLET | Freq: Every day | ORAL | 99 refills | Status: DC | PRN

## 2022-08-04 MED ORDER — LANCET DEVICE MISC: 0 refills | Status: AC

## 2022-08-04 MED ORDER — METOPROLOL TARTRATE 50 MG PO TABS: ORAL_TABLET | ORAL | 3 refills | Status: DC

## 2022-08-04 MED ORDER — BLOOD GLUCOSE TEST VI STRP: ORAL_STRIP | 3 refills | Status: DC

## 2022-08-04 MED ORDER — PREGABALIN 50 MG PO CAPS: 50.0000 mg | ORAL_CAPSULE | Freq: Two times a day (BID) | ORAL | 1 refills | Status: DC

## 2022-08-04 MED ORDER — METHOCARBAMOL 750 MG PO TABS: 750.0000 mg | ORAL_TABLET | Freq: Three times a day (TID) | ORAL | 0 refills | Status: DC | PRN

## 2022-08-04 MED ORDER — OMEPRAZOLE 20 MG PO CPDR: 20.0000 mg | DELAYED_RELEASE_CAPSULE | Freq: Every day | ORAL | 3 refills | Status: DC

## 2022-08-04 MED ORDER — ACYCLOVIR 400 MG PO TABS: 400.0000 mg | ORAL_TABLET | Freq: Three times a day (TID) | ORAL | 0 refills | Status: DC

## 2022-08-04 MED ORDER — METFORMIN HCL 500 MG PO TABS: 500.0000 mg | ORAL_TABLET | Freq: Every day | ORAL | 0 refills | Status: DC

## 2022-08-04 MED ORDER — OZEMPIC (0.25 OR 0.5 MG/DOSE) 2 MG/3ML ~~LOC~~ SOPN: PEN_INJECTOR | SUBCUTANEOUS | 0 refills | Status: DC

## 2022-08-04 MED ORDER — BLOOD GLUCOSE MONITORING SUPPL DEVI: 0 refills | Status: AC

## 2022-08-04 MED ORDER — LANCETS MISC. MISC: 3 refills | Status: AC

## 2022-08-04 MED ORDER — CITALOPRAM HYDROBROMIDE 40 MG PO TABS: ORAL_TABLET | ORAL | 3 refills | Status: DC

## 2022-08-04 NOTE — Progress Notes (Addendum)
Subjective: CC: Morbid obesity associated with chronic low back pain PCP: Janora Norlander, DO [id number] Cheryl Randall is a 47 y.o. female presenting to clinic today for:  1.  Morbid obesity associated with chronic low back pain, failed low back surgery Patient is here because she is at the highest weight that she is ever been.  She has gained about 109 pounds over the last several years.  This causes increased stress on her low back, which she suffers from at baseline.  She has seen surgery and there are no other interventions that they can do at this point.  They are recommending treatment with medications only.  Currently prescribed Lyrica 3 times daily and Robaxin 3 times daily.  She voices quite a bit of hesitation re: ongoing use of these medications because she feels that they can impair her judgment.  She helps take care of her mother and disabled brother and just does not feel that she can afford to be affected by these types of medications even if they do improve her quality of life.  She is quite depressed with regards to her weight and admits that she does not feel beautiful.  She has been a borderline diabetic for quite some time.  She is not treated with any antidiabetes medications, anticholesterol medicines.  She does take Lopressor for her heart.  Family history is very strong for type 2 diabetes in both her mother and brother.   She was previously treated with phentermine, which is what she thinks caused her heart issues.   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   Deep incisional surgical site infection 07/01/2015   Diabetes mellitus without complication ([place])     borderline; diet controlled   Essential hypertension 07/26/2019   Family history of adverse reaction to anesthesia    Patients son has bad N/V   GAD (generalized anxiety disorder) 06/26/2017   Generalized edema 03/24/2017   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[place]) 08/21/2017   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   Migraine    Palpitations 07/26/2019   Panic attack 06/26/2017   PONV (postoperative nausea and vomiting)    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   Staphylococcus aureus bacteremia with sepsis ([place])    Wound infection 06/30/2015    Current Outpatient Medications:    acetaminophen (TYLENOL) 500 MG tablet, Take 500 mg by mouth every 6 (six) hours as needed., Disp: , Rfl:    albuterol (VENTOLIN HFA) 108 (90 Base) MCG/ACT inhaler, Inhale 1-2 puffs into the lungs every 6 (six) hours as needed for wheezing or shortness of breath., Disp: 1 each, Rfl: 0   cetirizine (ZYRTEC) 10 MG tablet, Take 1 tablet (10 mg total) by mouth daily., Disp: 30 tablet, Rfl: 11   lidocaine (ASPERCREME LIDOCAINE) 4 %, Place 1 patch onto the skin daily., Disp: , Rfl:    acyclovir (ZOVIRAX) 400 MG tablet, Take 1 tablet (400 mg total) by mouth 3 (three) times daily. For 5 days at first sign of cold sores flare, Disp: 30 tablet, Rfl: 0   citalopram (CELEXA) 40 MG tablet, TAKE 1 Tablet BY MOUTH ONCE DAILY FOR MOOD, Disp: 90  tablet, Rfl: 3   methocarbamol (ROBAXIN) 750 MG tablet, Take 1 tablet (750 mg total) by mouth every 8 (eight) hours as needed for muscle spasms., Disp: 90 tablet, Rfl: 0   metoprolol tartrate (LOPRESSOR) 50 MG tablet, TAKE 1 Tablet  BY MOUTH TWICE DAILY FOR HEART, Disp: 180 tablet, Rfl: 3   omeprazole (PRILOSEC) 20 MG capsule, Take 1 capsule (20 mg total) by mouth daily., Disp: 90 capsule, Rfl: 3   pregabalin (LYRICA) 50 MG capsule, Take 1 capsule (50 mg total) by mouth 2 (two) times daily. Place on file, Disp: 180 capsule, Rfl: 1   SUMAtriptan (IMITREX) 50 MG tablet, Take 1 tablet (50 mg  total) by mouth daily as needed for migraine. May repeat in 4 hours if headache persists or recurs., Disp: 6 tablet, Rfl: PRN Social History   Socioeconomic History   Marital status: Divorced    Spouse name: Not on file   Number of children: 1   Years of education: Not on file   Highest education level: Some college, no degree  Occupational History   Not on file  Tobacco Use   Smoking status: Former    Types: Cigarettes    Quit date: 06/02/2014    Years since quitting: 8.1   Smokeless tobacco: Never  Vaping Use   Vaping Use: Never used  Substance and Sexual Activity   Alcohol use: No   Drug use: Not Currently    Types: Marijuana    Comment: none since a teenager   Sexual activity: Not Currently    Birth control/protection: Surgical  Other Topics Concern   Not on file  Social History Narrative   Not on file   Social Determinants of Health   Financial Resource Strain: Not on file  Food Insecurity: No Food Insecurity (02/28/2022)   Hunger Vital Sign    Worried About Running Out of Food in the Last Year: Never true    Ran Out of Food in the Last Year: Never true  Transportation Needs: No Transportation Needs (02/28/2022)   PRAPARE - Hydrologist (Medical): No    Lack of Transportation (Non-Medical): No  Physical Activity: Not on file  Stress: Not on file  Social Connections: Not on file  Intimate Partner Violence: Not on file   Family History  Problem Relation Age of Onset   Heart disease Mother    Diabetes Mother    Cancer Father        bladder   Autism Brother    Diabetes Brother    Heart disease Brother    Diabetes Brother    Diabetes Brother     Objective: Office vital signs reviewed. BP 132/86   Pulse 80   Temp 98.6 F (37 C)   Ht '5\' 7"'$  (1.702 m)   Wt (!) 308 lb (139.7 kg)   LMP 02/24/2013   SpO2 96%   BMI 48.24 kg/m   Physical Examination:  General: Awake, alert, super morbidly obese, No acute distress HEENT: sclera  white, MMM.  No exophthalmos.  No goiter Cardio: regular rate and rhythm, S1S2 heard, no murmurs appreciated Pulm: clear to auscultation bilaterally, no wheezes, rhonchi or rales; normal work of breathing on room air Extremities: warm, well perfused, No edema, cyanosis or clubbing; +2 pulses bilaterally MSK: antalgic gait and station Psych: Tearful     08/04/2022    2:05 PM 07/07/2022    9:53 AM 05/22/2022   11:36 AM  Depression screen PHQ 2/9  Decreased Interest 0 0 0  Down, Depressed, Hopeless 0 0 0  PHQ - 2 Score 0 0 0  Altered sleeping 0 0   Tired, decreased energy 0 0   Change in appetite 0 0   Feeling bad or failure about yourself  0 0   Trouble concentrating 0 0   Moving slowly or fidgety/restless 0 0   Suicidal thoughts 0 0   PHQ-9 Score 0 0   Difficult doing work/chores Not difficult at all Not difficult at all       08/04/2022    2:04 PM 10/01/2020    9:51 AM 01/18/2020   10:22 AM 07/26/2019   11:19 AM  GAD 7 : Generalized Anxiety Score  Nervous, Anxious, on Edge 0 0 1 3  Control/stop worrying 0 0 0 0  Worry too much - different things 0 0 0 0  Trouble relaxing 0 0 1 3  Restless 0 0 0 0  Easily annoyed or irritable 0 0 0 0  Afraid - awful might happen 0 0 0 0  Total GAD 7 Score 0 0 2 6  Anxiety Difficulty Not difficult at all Not difficult at all  Somewhat difficult    Assessment/ Plan: 47 y.o. female   Morbid obesity ([place]) - Plan: Bayer DCA Hb A1c Waived, CMP14+EGFR, TSH, T4, Free, VITAMIN Cheryl 25 Hydroxy (Vit-Cheryl Deficiency, Fractures), Amb ref to Medical Nutrition Therapy-MNT  Lumbar radiculopathy - Plan: Drug Screen 10 W/Conf, Se, pregabalin (LYRICA) 50 MG capsule, methocarbamol (ROBAXIN) 750 MG tablet  Failed back surgical syndrome - Plan: pregabalin (LYRICA) 50 MG capsule  Chronic pain syndrome - Plan: Drug Screen 10 W/Conf, Se, pregabalin (LYRICA) 50 MG capsule, methocarbamol (ROBAXIN) 750 MG tablet  Back pain with history of spinal surgery - Plan: Drug Screen  10 W/Conf, Se, pregabalin (LYRICA) 50 MG capsule, methocarbamol (ROBAXIN) 750 MG tablet  Controlled substance agreement signed - Plan: Drug Screen 10 W/Conf, Se  Hypertension, essential - Plan: CMP14+EGFR, Amb ref to Medical Nutrition Therapy-MNT  Pre-diabetes - Plan: Bayer DCA Hb A1c Waived, Amb ref to Medical Nutrition Therapy-MNT  Screen for colon cancer - Plan: Ambulatory referral to Gastroenterology  Depression with anxiety - Plan: citalopram (CELEXA) 40 MG tablet  Gastroesophageal reflux disease without esophagitis - Plan: omeprazole (PRILOSEC) 20 MG capsule  Migraine without aura and without status migrainosus, not intractable - Plan: SUMAtriptan (IMITREX) 50 MG tablet  Patient continues to struggle with morbid obesity.  We identified soda intake as a possible cause and I have highly recommend that she start weaning.  She is only eating 1 meal per day so I am not sure why she is not experiencing caloric deficit, precipitating weight loss.  She certainly has impaired mobility secondary to chronic back pain status post failed surgery.  Lyrica likely contributing to weight issues as well as Celexa but unfortunately there is very little room for change in these medications.  I am going to look for metabolic etiology of weight.  I have referred her to a nutritionist as well.  I think seated exercises would be of benefit to her given her limited endurance mobility due to low back pain.  Controlled substance contract and urine drug screen were updated as per office policy for the Lyrica.  National narcotic database reviewed and there were no red flags.  PPI renewed for GERD as well as Imitrex for migraine headaches which may be exacerbated by weaning from caffeine.  I would like to see her back in  the next 3 to 4 months for full physical exam with fasting labs.  We discussed weight loss treatment options including GLP, GIP, Qsymia, Contrave.  We discussed that Medicaid and Medicare traditionally  have not cover these types of medications.  Orders Placed This Encounter  Procedures   Bayer DCA Hb A1c Waived   CMP14+EGFR   TSH   T4, Free   VITAMIN Cheryl 25 Hydroxy (Vit-Cheryl Deficiency, Fractures)   Drug Screen 10 W/Conf, Se   Ambulatory referral to Gastroenterology    Referral Priority:   Routine    Referral Type:   Consultation    Referral Reason:   Specialty Services Required    Number of Visits Requested:   1   Amb ref to Medical Nutrition Therapy-MNT    Referral Priority:   Routine    Referral Type:   Consultation    Referral Reason:   Specialty Services Required    Requested Specialty:   Nutrition    Number of Visits Requested:   1   Meds ordered this encounter  Medications   pregabalin (LYRICA) 50 MG capsule    Sig: Take 1 capsule (50 mg total) by mouth 2 (two) times daily. Place on file    Dispense:  180 capsule    Refill:  1    Fill one day early if pharmacy is closed on scheduled refill date. May substitute for generic if available.   acyclovir (ZOVIRAX) 400 MG tablet    Sig: Take 1 tablet (400 mg total) by mouth 3 (three) times daily. For 5 days at first sign of cold sores flare    Dispense:  30 tablet    Refill:  0   citalopram (CELEXA) 40 MG tablet    Sig: TAKE 1 Tablet BY MOUTH ONCE DAILY FOR MOOD    Dispense:  90 tablet    Refill:  3   methocarbamol (ROBAXIN) 750 MG tablet    Sig: Take 1 tablet (750 mg total) by mouth every 8 (eight) hours as needed for muscle spasms.    Dispense:  90 tablet    Refill:  0   metoprolol tartrate (LOPRESSOR) 50 MG tablet    Sig: TAKE 1 Tablet  BY MOUTH TWICE DAILY FOR HEART    Dispense:  180 tablet    Refill:  3   omeprazole (PRILOSEC) 20 MG capsule    Sig: Take 1 capsule (20 mg total) by mouth daily.    Dispense:  90 capsule    Refill:  3   SUMAtriptan (IMITREX) 50 MG tablet    Sig: Take 1 tablet (50 mg total) by mouth daily as needed for migraine. May repeat in 4 hours if headache persists or recurs.    Dispense:  6  tablet    Refill:  PRN   metFORMIN (GLUCOPHAGE) 500 MG tablet    Sig: Take 1 tablet (500 mg total) by mouth daily with breakfast.    Dispense:  90 tablet    Refill:  0   Semaglutide,0.25 or 0.'5MG'$ /DOS, (OZEMPIC, 0.25 OR 0.5 MG/DOSE,) 2 MG/3ML SOPN    Sig: Inject 0.25 mg into the skin every 7 (seven) days for 28 days, THEN 0.5 mg every 7 (seven) days.    Dispense:  9 mL    Refill:  0   Blood Glucose Monitoring Suppl DEVI    Sig: Check sugars up to 2 times daily. May substitute to any manufacturer covered by patient's insurance. E11.9    Dispense:  1 each  Refill:  0   Glucose Blood (BLOOD GLUCOSE TEST STRIPS) STRP    Sig: Check sugar up to 2 times daily. May substitute to any manufacturer covered by patient's insurance. E11.9    Dispense:  100 strip    Refill:  3   Lancet Device MISC    Sig: Check sugar up to 2 times daily. May substitute to any manufacturer covered by patient's insurance. E11.9    Dispense:  1 each    Refill:  0   Lancets Misc. MISC    Sig: Check sugar up to 2 times daily. May substitute to any manufacturer covered by patient's insurance.E11.9    Dispense:  100 each    Refill:  3     Cheryl Hojnacki Windell Moulding, DO [facility] [phone]  **A1c Demonstrates NEW ONSET Type 2 diabetes at 7.1.  Start Metformin '500mg'$  daily and ozempic 0.'25mg'$  weekly.  Reassess in 64m  DM supplies for sugar monitoring also sent.  Pt to come in tomorrow to review injection administration with triage nurse.

## 2022-08-04 NOTE — Addendum Note (Signed)
Addended by: Janora Norlander on: 08/04/2022 03:48 PM   Modules accepted: Orders

## 2022-08-05 ENCOUNTER — Encounter: Payer: Self-pay | Admitting: *Deleted

## 2022-08-05 ENCOUNTER — Other Ambulatory Visit: Payer: Self-pay | Admitting: Family Medicine

## 2022-08-05 DIAGNOSIS — E559 Vitamin D deficiency, unspecified: Secondary | ICD-10-CM

## 2022-08-05 MED ORDER — CHOLECALCIFEROL 1.25 MG (50000 UT) PO CAPS: 50000.0000 [IU] | ORAL_CAPSULE | ORAL | 0 refills | Status: DC

## 2022-08-06 ENCOUNTER — Telehealth: Payer: Self-pay | Admitting: Family Medicine

## 2022-08-07 ENCOUNTER — Other Ambulatory Visit (HOSPITAL_COMMUNITY): Payer: Self-pay

## 2022-08-07 LAB — DRUG SCREEN 10 W/CONF, SERUM
Amphetamines, IA: NEGATIVE ng/mL
Barbiturates, IA: NEGATIVE ug/mL
Benzodiazepines, IA: NEGATIVE ng/mL
Cocaine & Metabolite, IA: NEGATIVE ng/mL
Methadone, IA: NEGATIVE ng/mL
Opiates, IA: NEGATIVE ng/mL
Oxycodones, IA: NEGATIVE ng/mL
Phencyclidine, IA: NEGATIVE ng/mL
Propoxyphene, IA: NEGATIVE ng/mL
THC(Marijuana) Metabolite, IA: NEGATIVE ng/mL

## 2022-08-07 LAB — CMP14+EGFR
ALT: 20 IU/L (ref 0–32)
AST: 20 IU/L (ref 0–40)
Albumin/Globulin Ratio: 1.8 (ref 1.2–2.2)
Albumin: 4.1 g/dL (ref 3.9–4.9)
Alkaline Phosphatase: 83 IU/L (ref 44–121)
BUN/Creatinine Ratio: 20 (ref 9–23)
BUN: 12 mg/dL (ref 6–24)
Bilirubin Total: 0.3 mg/dL (ref 0.0–1.2)
CO2: 20 mmol/L (ref 20–29)
Calcium: 9.2 mg/dL (ref 8.7–10.2)
Chloride: 101 mmol/L (ref 96–106)
Creatinine, Ser: 0.59 mg/dL (ref 0.57–1.00)
Globulin, Total: 2.3 g/dL (ref 1.5–4.5)
Glucose: 163 mg/dL — ABNORMAL HIGH (ref 70–99)
Potassium: 4.2 mmol/L (ref 3.5–5.2)
Sodium: 135 mmol/L (ref 134–144)
Total Protein: 6.4 g/dL (ref 6.0–8.5)
eGFR: 112 mL/min/{1.73_m2} (ref >59)

## 2022-08-07 LAB — VITAMIN D 25 HYDROXY (VIT D DEFICIENCY, FRACTURES): Vit D, 25-Hydroxy: 15 ng/mL — ABNORMAL LOW (ref 30.0–100.0)

## 2022-08-07 LAB — T4, FREE: Free T4: 1.1 ng/dL (ref 0.82–1.77)

## 2022-08-07 LAB — TSH: TSH: 3.29 u[IU]/mL (ref 0.450–4.500)

## 2022-08-07 NOTE — Telephone Encounter (Signed)
Patient Advocate Encounter  Prior Authorization for Ozempic (0.25 or 0.5 MG/DOSE) '2MG'$ /3ML pen-injectors has been approved.    PA# [id number] Effective dates: 08/06/22 through 08/06/23

## 2022-08-12 ENCOUNTER — Telehealth: Payer: Self-pay | Admitting: Family Medicine

## 2022-08-12 NOTE — Telephone Encounter (Signed)
Ok to discontinue. Please document in allergy list as intolerance.  Continue Ozempic as prescribed

## 2022-08-13 ENCOUNTER — Encounter: Payer: Self-pay | Admitting: Nutrition

## 2022-08-13 ENCOUNTER — Encounter: Payer: Medicaid Other | Attending: Family Medicine | Admitting: Nutrition

## 2022-08-13 VITALS — Ht 67.0 in | Wt 302.0 lb

## 2022-08-13 DIAGNOSIS — I10 Essential (primary) hypertension: Secondary | ICD-10-CM | POA: Diagnosis not present

## 2022-08-13 DIAGNOSIS — Z713 Dietary counseling and surveillance: Secondary | ICD-10-CM | POA: Insufficient documentation

## 2022-08-13 DIAGNOSIS — R7303 Prediabetes: Secondary | ICD-10-CM | POA: Diagnosis not present

## 2022-08-13 DIAGNOSIS — E118 Type 2 diabetes mellitus with unspecified complications: Secondary | ICD-10-CM

## 2022-08-13 DIAGNOSIS — Z6841 Body Mass Index (BMI) 40.0 and over, adult: Secondary | ICD-10-CM

## 2022-08-13 NOTE — Progress Notes (Unsigned)
Medical Nutrition Therapy  Appointment Start time:  0930  Appointment End time:  1100 Primary concerns today: Obesity, DM Type 2  Referral diagnosis: E11.8,E66.01  Preferred learning style: See , visuals.   Learning readiness: Ready    NUTRITION ASSESSMENT  47 yr old wfemale here for new diagnosis of DM Type 2 and Obesity. PMH: Depression, HTN and anxiety. On celxa for anxiety for 5  years. Had back surgery for ruptured disk x 4, surgeries started in 2016. Was 208 lbs before back surgeries. Has gained about 100 lbs. She notes her biggest issues are sodas, sweets and processed foods. A1C was 5.9% in June 2023 and now is A1C 7.1%. Drinks 2 -20 oz bottles of soda per day. Has stopped drinkign sodas and lost 7 lbs in the last 2 weeks. Started Ozempic a week ago.  Currently applying for disability due to back issues. And surgeries. Anthropometrics  Wt Readings from Last 3 Encounters:  08/13/22 (!) 302 lb (137 kg)  08/04/22 (!) 308 lb (139.7 kg)  05/22/22 300 lb (136.1 kg)   Ht Readings from Last 3 Encounters:  08/13/22 '5\' 7"'$  (1.702 m)  08/04/22 '5\' 7"'$  (1.702 m)  07/07/22 '5\' 7"'$  (1.702 m)   Body mass index is 47.3 kg/m. '@BMIFA'$ @ Facility age limit for growth %iles is 20 years. Facility age limit for growth %iles is 20 years. \   Clinical Medical Hx: *** Medications: *** Labs: Ozempic Notable Signs/Symptoms: none, craves sugars  Lifestyle & Dietary Hx Lives with her son. Currently not working.Applied for disability.   Estimated daily fluid intake: 30-40 oz Supplements: none Sleep: 6-8 hrs Stress / self-care: none Current average weekly physical activity: ADL  24-Hr Dietary Recall First Meal:  9 am Fruit loops.2 cups, whole milk,  Snack:  Second Meal: skipped, water 20 oz Snack:  Third Meal:  8 pm [place] sandwich on white bread, lentils chips, water 20 oz Snack:  Beverages: Water  Estimated Energy Needs Calories: *** Carbohydrate: ***g Protein: ***g Fat:  ***g   NUTRITION DIAGNOSIS  {CHL AMB NUTRITIONAL DIAGNOSIS:[phone]}   NUTRITION INTERVENTION  Nutrition education (E-1) on the following topics:  ***  Handouts Provided Include  ***  Learning Style & Readiness for Change Teaching method utilized: Visual & Auditory  Demonstrated degree of understanding via: Teach Back  Barriers to learning/adherence to lifestyle change: ***  Goals Established by Pt ***   MONITORING & EVALUATION Dietary intake, weekly physical activity, and *** in ***.  Next Steps  Patient is to ***.

## 2022-08-13 NOTE — Patient Instructions (Signed)
Goals Focus on more whole plant based foods Eat three meals per day; don't skip meals Cut out sodas and only drink water-5-6 bottles per day. Check into the [facility] to see about water aerobics.

## 2022-08-14 ENCOUNTER — Telehealth: Payer: Self-pay | Admitting: Family Medicine

## 2022-08-14 ENCOUNTER — Encounter: Payer: Self-pay | Admitting: Nutrition

## 2022-08-15 NOTE — Telephone Encounter (Signed)
Ok to do. If there is an order to be placed, please have her send me the form and I will sign

## 2022-08-15 NOTE — Telephone Encounter (Signed)
Left vm for cb

## 2022-08-30 ENCOUNTER — Other Ambulatory Visit: Payer: Self-pay | Admitting: Family Medicine

## 2022-08-30 DIAGNOSIS — G894 Chronic pain syndrome: Secondary | ICD-10-CM

## 2022-08-30 DIAGNOSIS — Z9889 Other specified postprocedural states: Secondary | ICD-10-CM

## 2022-08-30 DIAGNOSIS — M5416 Radiculopathy, lumbar region: Secondary | ICD-10-CM

## 2022-09-01 NOTE — Telephone Encounter (Signed)
Last office visit 08/04/22 Last refill 08/04/22, #90, no refills

## 2022-09-03 ENCOUNTER — Telehealth: Payer: Self-pay | Admitting: Family Medicine

## 2022-09-17 ENCOUNTER — Encounter: Payer: Medicaid Other | Attending: Family Medicine | Admitting: Nutrition

## 2022-09-17 VITALS — Ht 67.0 in | Wt 296.4 lb

## 2022-09-17 DIAGNOSIS — I10 Essential (primary) hypertension: Secondary | ICD-10-CM | POA: Insufficient documentation

## 2022-09-17 DIAGNOSIS — E118 Type 2 diabetes mellitus with unspecified complications: Secondary | ICD-10-CM | POA: Insufficient documentation

## 2022-09-17 NOTE — Patient Instructions (Addendum)
Goals Keep up the great jobs. Increase protein and dried beans and peas Increase  to eating 5-6 servings of fruits and vegetables Try some chair exercises. Get A1C down to 6%

## 2022-09-17 NOTE — Progress Notes (Signed)
Medical Nutrition Therapy  Appointment Start time:  1330 Appointment End time:  1400 Primary concerns today: Obesity, DM Type 2  Referral diagnosis: E11.8,E66.01  Preferred learning style: See , visuals.   Learning readiness: Ready    NUTRITION ASSESSMENT Follow up CHanges made: watching what she eats. Watching portions. Choosing healthier foods. Not able to walka lot due to her leg and back hurt. Willing to try chair exercises. Still on Ozmepic. Lost another 6 lbs. Doesn't get hungry. She notes the Ozempic helps her reduce food cravings and not having thoughts about food between meals, Has cut out sodas and a lot of junk food. Drinking water now. FBS 110-120's. Hasn't been checking BS at night. Doesn't eat late anymore and is eating before 7 pm. Will get A1C rechecked at next PCP visit next month. Hasn't been able to get paperwork done for the [facility] yet. Working on it and will talk to [facility] today. Diet is still low in fresh fruits, vegetables and whole grains. Meals need to be better balanced of higher fiber foods and more whole foods.   Lab Results  Component Value Date   HGBA1C 7.1 (H) 08/04/2022     Anthropometrics  Wt Readings from Last 3 Encounters:  09/17/22 296 lb 6.4 oz (134.4 kg)  08/13/22 (!) 302 lb (137 kg)  08/04/22 (!) 308 lb (139.7 kg)   Ht Readings from Last 3 Encounters:  09/17/22  (1.702 m)  08/13/22  (1.702 m)  08/04/22  (1.702 m)   Body mass index is 46.42 kg/m. @ Facility age limit for growth %iles is 20 years. Facility age limit for growth %iles is 20 years.   Clinical Medical Hx: See chart Medications: Ozempic Labs:  Lab Results  Component Value Date   HGBA1C 7.1 (H) 08/04/2022      Latest Ref Rng & Units 08/04/2022    2:38 PM 11/18/2021   10:46 AM 08/15/2021    5:53 PM  CMP  Glucose 70 - 99 mg/dL 629  528  413   BUN 6 - 24 mg/dL Creatinine 0.57 - 1.00 mg/dL 2.44  0.10  2.72   Sodium 134 - 144 mmol/L 135   137  139   Potassium 3.5 - 5.2 mmol/L 4.2  4.3  3.5   Chloride 96 - 106 mmol/L 101  107  102   CO2 20 - 29 mmol/L Calcium 8.7 - 10.2 mg/dL 9.2  8.9  8.4   Total Protein 6.0 - 8.5 g/dL 6.4  7.0    Total Bilirubin 0.0 - 1.2 mg/dL 0.3  0.5    Alkaline Phos 44 - 121 IU/L 83  60    AST 0 - 40 IU/L 20  22    ALT 0 - 32 IU/L 20  25     Lipid Panel     Component Value Date/Time   CHOL 156 11/18/2021 1045   CHOL 165 07/26/2019 1119   TRIG 103 11/18/2021 1045   HDL 50 11/18/2021 1045   HDL 59 07/26/2019 1119   CHOLHDL 3.1 11/18/2021 1045   VLDL 21 11/18/2021 1045   LDLCALC 85 11/18/2021 1045   LDLCALC 90 07/26/2019 1119   LABVLDL 16 07/26/2019 1119    Notable Signs/Symptoms: none, craves sugars  Lifestyle & Dietary Hx Lives with her son. Currently not working.Applied for disability. Can't stand long on her feet to chronic back pain.  Estimated daily fluid  intake: 30-40 oz Supplements: none Sleep: 6-8 hrs Stress / self-care: none Current average weekly physical activity: ADL  24-Hr Dietary Recall First Meal:  Raisin bran or crispies.and whole milk 1 cup Apple, Second Meal: Mexican-french fries 15, apple water Snack: belvetta breakfast wafers 2, water Third Meal:  8 pm Cajun chicken sandwich on white bread, water  Estimated Energy Needs Calories: 1200 Carbohydrate: 135g Protein: 90g Fat: 33g   NUTRITION DIAGNOSIS  NB-1.1 Food and nutrition-related knowledge deficit As related to High sugar high calorie processed diet.  As evidenced by DM Type 2 and BMI 47,.   NUTRITION INTERVENTION  Nutrition education (E-1) on the following topics:  Nutrition and Diabetes education provided on My Plate, CHO counting, meal planning, portion sizes, timing of meals, avoiding snacks between meals unless having a low blood sugar, target ranges for A1C and blood sugars, signs/symptoms and treatment of hyper/hypoglycemia, monitoring blood sugars, taking medications as prescribed,  benefits of exercising 30 minutes per day and prevention of complications of DM.   Lifestyle Medicine  - Whole Food, Plant Predominant Nutrition is highly recommended: Eat Plenty of vegetables, Mushrooms, fruits, Legumes, Whole Grains, Nuts, seeds in lieu of processed meats, processed snacks/pastries red meat, poultry, eggs.    -It is better to avoid simple carbohydrates including: Cakes, Sweet Desserts, Ice Cream, Soda (diet and regular), Sweet Tea, Candies, Chips, Cookies, Store Bought Juices, Alcohol in Excess of  1-2 drinks a day, Lemonade,  Artificial Sweeteners, Doughnuts, Coffee Creamers, "Sugar-free" Products, etc, etc.  This is not a complete list.....  Exercise: If you are able: 30 -60 minutes a day ,4 days a week, or 150 minutes a week.  The longer the better.  Combine stretch, strength, and aerobic activities.  If you were told in the past that you have high risk for cardiovascular diseases, you may seek evaluation by your heart doctor prior to initiating moderate to intense exercise programs.    Handouts Provided Include  Lifestyle Medicine Know your numbers CVD risk and DM  Learning Style & Readiness for Change Teaching method utilized: Visual & Auditory  Demonstrated degree of understanding via: Teach Back  Barriers to learning/adherence to lifestyle change: back pain limiting walking.  Goals Established by Pt Goals Keep up the great jobs. Increase protein and dried beans and peas Increase  to eating 5-6 servings of fruits and vegetables Try some chair exercises. Get A1C down to 6%  MONITORING & EVALUATION Dietary intake, weekly physical activity, and bloods sugars in 1 month.  Next Steps  Patient is to work on eating more whole plant based foods and drinking only water.Marland Kitchen

## 2022-09-19 ENCOUNTER — Encounter: Payer: Self-pay | Admitting: Nutrition

## 2022-10-08 ENCOUNTER — Encounter: Payer: Self-pay | Admitting: Family Medicine

## 2022-10-08 ENCOUNTER — Ambulatory Visit: Payer: Medicaid Other | Admitting: Family Medicine

## 2022-10-16 ENCOUNTER — Ambulatory Visit (INDEPENDENT_AMBULATORY_CARE_PROVIDER_SITE_OTHER): Payer: Medicaid Other | Admitting: Family Medicine

## 2022-10-16 ENCOUNTER — Encounter: Payer: Self-pay | Admitting: Family Medicine

## 2022-10-16 ENCOUNTER — Other Ambulatory Visit: Payer: Self-pay | Admitting: Family Medicine

## 2022-10-16 VITALS — BP 104/79 | HR 77 | Temp 97.9°F | Ht 67.0 in | Wt 293.0 lb

## 2022-10-16 DIAGNOSIS — E119 Type 2 diabetes mellitus without complications: Secondary | ICD-10-CM

## 2022-10-16 DIAGNOSIS — B379 Candidiasis, unspecified: Secondary | ICD-10-CM | POA: Diagnosis not present

## 2022-10-16 DIAGNOSIS — N3000 Acute cystitis without hematuria: Secondary | ICD-10-CM

## 2022-10-16 DIAGNOSIS — R300 Dysuria: Secondary | ICD-10-CM | POA: Diagnosis not present

## 2022-10-16 DIAGNOSIS — T3695XA Adverse effect of unspecified systemic antibiotic, initial encounter: Secondary | ICD-10-CM | POA: Diagnosis not present

## 2022-10-16 LAB — URINALYSIS, ROUTINE W REFLEX MICROSCOPIC
Bilirubin, UA: NEGATIVE
Glucose, UA: NEGATIVE
Ketones, UA: NEGATIVE
Nitrite, UA: POSITIVE — AB
Specific Gravity, UA: >1.03 — ABNORMAL HIGH (ref 1.005–1.030)
Urobilinogen, Ur: 0.2 mg/dL (ref 0.2–1.0)
pH, UA: 6 (ref 5.0–7.5)

## 2022-10-16 LAB — MICROSCOPIC EXAMINATION
RBC, Urine: NONE SEEN /hpf (ref 0–2)
Renal Epithel, UA: NONE SEEN /hpf

## 2022-10-16 MED ORDER — FLUCONAZOLE 150 MG PO TABS: ORAL_TABLET | ORAL | 0 refills | Status: DC

## 2022-10-16 MED ORDER — CEPHALEXIN 500 MG PO CAPS: 500.0000 mg | ORAL_CAPSULE | Freq: Two times a day (BID) | ORAL | 0 refills | Status: DC

## 2022-10-16 NOTE — Progress Notes (Signed)
   Acute Office Visit  Subjective:     Patient ID: Cheryl Randall, female    DOB: [birth year], 47 y.o.   MRN: [id number]  Chief Complaint  Patient presents with   Dysuria    Dysuria  This is a new problem. Episode onset: 3 days. The problem occurs every urination. The problem has been gradually worsening. The quality of the pain is described as burning. There has been no fever. Associated symptoms include frequency and urgency. Pertinent negatives include no chills, discharge, flank pain, hematuria, hesitancy, nausea, possible pregnancy, sweats or vomiting. She has tried nothing for the symptoms.     Review of Systems  Constitutional:  Negative for chills.  Gastrointestinal:  Negative for nausea and vomiting.  Genitourinary:  Positive for dysuria, frequency and urgency. Negative for flank pain, hematuria and hesitancy.        Objective:    BP 104/79   Pulse 77   Temp 97.9 F (36.6 C) (Temporal)   Ht 5\' 7"  (1.702 m)   Wt 293 lb (132.9 kg)   LMP 02/24/2013   SpO2 94%   BMI 45.89 kg/m    Physical Exam Vitals and nursing note reviewed.  Constitutional:      General: She is not in acute distress.    Appearance: Normal appearance. She is not ill-appearing, toxic-appearing or diaphoretic.  Pulmonary:     Effort: Pulmonary effort is normal. No respiratory distress.  Abdominal:     Tenderness: There is abdominal tenderness in the suprapubic area. There is no right CVA tenderness, left CVA tenderness, guarding or rebound.  Musculoskeletal:     Right lower leg: No edema.     Left lower leg: No edema.  Skin:    General: Skin is warm and dry.  Neurological:     General: No focal deficit present.     Mental Status: She is alert and oriented to person, place, and time.  Psychiatric:        Mood and Affect: Mood normal.        Behavior: Behavior normal.    Urine dipstick shows positive for RBC's, positive for protein, positive for nitrates, and positive for leukocytes.   Micro exam: negative for RBC's, 11-30 WBC's per HPF, and many + bacteria.   No results found for any visits on 10/16/22.      Assessment & Plan:   Cheryl Randall was seen today for dysuria.  Diagnoses and all orders for this visit:  Acute cystitis without hematuria Keflex as below pending culture. Discussed symptomatic care and return precautions.  -     Urinalysis, Routine w reflex microscopic -     cephALEXin (KEFLEX) 500 MG capsule; Take 1 capsule (500 mg total) by mouth 2 (two) times daily. -     Urine Culture  Antibiotic-induced yeast infection -     fluconazole (DIFLUCAN) 150 MG tablet; Take one tablet by mouth. Repeat in 3 days if symptoms persist.  The patient indicates understanding of these issues and agrees with the plan.   Gabriel Earing, FNP

## 2022-10-19 LAB — URINE CULTURE

## 2022-10-30 ENCOUNTER — Telehealth (INDEPENDENT_AMBULATORY_CARE_PROVIDER_SITE_OTHER): Payer: Medicaid Other | Admitting: Family

## 2022-10-30 ENCOUNTER — Encounter: Payer: Self-pay | Admitting: Family

## 2022-10-30 DIAGNOSIS — A084 Viral intestinal infection, unspecified: Secondary | ICD-10-CM | POA: Diagnosis not present

## 2022-10-30 MED ORDER — ONDANSETRON 4 MG PO TBDP: 4.0000 mg | ORAL_TABLET | Freq: Three times a day (TID) | ORAL | 0 refills | Status: DC | PRN

## 2022-10-30 NOTE — Progress Notes (Signed)
Virtual Visit Consent   Cheryl Randall, you are scheduled for a virtual visit with a [facility] provider today. Just as with appointments in the office, your consent must be obtained to participate. Your consent will be active for this visit and any virtual visit you may have with one of our providers in the next 365 days. If you have a MyChart account, a copy of this consent can be sent to you electronically.  As this is a virtual visit, video technology does not allow for your provider to perform a traditional examination. This may limit your provider's ability to fully assess your condition. If your provider identifies any concerns that need to be evaluated in person or the need to arrange testing (such as labs, EKG, etc.), we will make arrangements to do so. Although advances in technology are sophisticated, we cannot ensure that it will always work on either your end or our end. If the connection with a video visit is poor, the visit may have to be switched to a telephone visit. With either a video or telephone visit, we are not always able to ensure that we have a secure connection.  By engaging in this virtual visit, you consent to the provision of healthcare and authorize for your insurance to be billed (if applicable) for the services provided during this visit. Depending on your insurance coverage, you may receive a charge related to this service.  I need to obtain your verbal consent now. Are you willing to proceed with your visit today? Cheryl Randall has provided verbal consent on 10/30/2022 for a virtual visit (video or telephone). Jannifer Rodney, FNP  Date: 10/30/2022 3:21 PM  Virtual Visit via Video Note   I, Jannifer Rodney, connected with  Cheryl Randall  ([id number], Aug 21, 1975) on 10/30/22 at  6:00 PM EDT by a video-enabled telemedicine application and verified that I am speaking with the correct person using two identifiers.  Location: Patient: Virtual Visit Location Patient:  Home Provider: Virtual Visit Location Provider: Office/Clinic   I discussed the limitations of evaluation and management by telemedicine and the availability of in person appointments. The patient expressed understanding and agreed to proceed.    History of Present Illness: Cheryl Randall is a 47 y.o. who identifies as a female who was assigned female at birth, and is being seen today for abdominal pain, nausea & V, and diarrhea that started two days ago.  HPI: Diarrhea  This is a new problem. The problem occurs 2 to 4 times per day. The problem has been unchanged. The stool consistency is described as Watery. Associated symptoms include chills, headaches and vomiting. She has tried increased fluids for the symptoms. The treatment provided mild relief.  Emesis  This is a new problem. The current episode started in the past 7 days. The problem occurs 2 to 4 times per day. The emesis has an appearance of stomach contents. There has been no fever. Associated symptoms include chills, diarrhea and headaches. She has tried bed rest for the symptoms. The treatment provided mild relief.    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   Chronic pain syndrome 11/25/2016   DDD (degenerative disc disease), lumbosacral 11/24/2016   Deep incisional surgical site infection 07/01/2015   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    Allergies:  Allergies  Allergen Reactions   Codeine Itching and Rash   Latex Itching and  Rash   Norco [Hydrocodone-Acetaminophen] Itching and Rash   Tape Itching and Rash   Medications:  Current Outpatient Medications:    ondansetron (ZOFRAN-ODT) 4 MG disintegrating tablet, Take 1 tablet (4 mg total) by mouth every 8 (eight) hours as needed for nausea or vomiting., Disp: 20 tablet, Rfl: 0   acetaminophen (TYLENOL) 500 MG tablet, Take 500 mg by mouth every 6 (six) hours as needed., Disp: , Rfl:    acyclovir (ZOVIRAX) 400 MG tablet, Take 1 tablet (400 mg total) by mouth 3 (three) times daily. For 5 days at first sign of cold sores flare, Disp: 30 tablet, Rfl: 0   albuterol (VENTOLIN HFA) 108 (90 Base) MCG/ACT inhaler, Inhale 1-2 puffs into the lungs every 6 (six) hours as needed for wheezing or shortness of breath., Disp: 1 each, Rfl: 0   Blood Glucose Monitoring Suppl DEVI, Check sugars up to 2 times daily. May substitute to any manufacturer covered by patient's insurance. E11.9, Disp: 1 each, Rfl: 0   cetirizine (ZYRTEC) 10 MG tablet, Take 1 tablet (10 mg total) by mouth daily., Disp: 30 tablet, Rfl: 11   Cholecalciferol 1.25 MG (50000 UT) capsule, Take 1 capsule (50,000 Units total) by mouth every 7 (seven) days. X12 weeks. Then start OTC VIt D 400 IU daily., Disp: 12 capsule, Rfl: 0   citalopram (CELEXA) 40 MG tablet, TAKE 1 Tablet BY MOUTH ONCE DAILY FOR MOOD, Disp: 90 tablet, Rfl: 3   Glucose Blood (BLOOD GLUCOSE TEST STRIPS) STRP, Check sugar up to 2 times daily. May substitute to any manufacturer covered by patient's insurance. E11.9, Disp: 100 strip, Rfl: 3   Lancet Device MISC, Check sugar up to 2 times daily. May substitute to any manufacturer covered by patient's insurance. E11.9, Disp: 1 each, Rfl: 0   Lancets Misc. MISC, Check sugar up to 2 times daily. May substitute to any manufacturer covered by patient's insurance.E11.9, Disp: 100 each, Rfl: 3   lidocaine (ASPERCREME LIDOCAINE) 4 %, Place 1 patch onto the skin daily., Disp: , Rfl:    metFORMIN (GLUCOPHAGE) 500 MG  tablet, Take 1 tablet (500 mg total) by mouth daily with breakfast., Disp: 90 tablet, Rfl: 0   methocarbamol (ROBAXIN) 750 MG tablet, TAKE 1 TABLET BY MOUTH EVERY 8 HOURS AS NEEDED FOR MUSCLE SPASMS, Disp: 90 tablet, Rfl: 0   metoprolol tartrate (LOPRESSOR) 50 MG tablet, TAKE 1 Tablet  BY MOUTH TWICE DAILY FOR HEART, Disp: 180 tablet, Rfl: 3   omeprazole (PRILOSEC) 20 MG capsule, Take 1 capsule (20 mg total) by mouth daily., Disp: 90 capsule, Rfl: 3   pregabalin (LYRICA) 50 MG capsule, Take 1 capsule (50 mg total) by mouth 2 (two) times daily. Place on file, Disp: 180 capsule, Rfl: 1   Semaglutide,0.25 or 0.5MG /DOS, (OZEMPIC, 0.25 OR 0.5 MG/DOSE,) 2 MG/3ML SOPN, Inject 0.5 mg into the skin once a week., Disp: 9 mL, Rfl: 0   SUMAtriptan (IMITREX) 50 MG tablet, Take 1 tablet (50 mg total) by mouth daily as needed for migraine. May repeat in 4 hours if headache persists or recurs., Disp: 6 tablet, Rfl: PRN  Observations/Objective: Patient is well-developed, well-nourished in no acute distress.  Resting comfortably  at home.  Head is normocephalic, atraumatic.  No labored breathing.  Speech is clear and coherent with logical content.  Patient is alert and oriented at baseline.  No abdominal pain when pushing in four quadrants  Assessment and Plan: 1. Viral gastroenteritis - ondansetron (ZOFRAN-ODT) 4 MG disintegrating tablet; Take 1 tablet (4 mg total) by mouth every 8 (eight) hours as needed for nausea or vomiting.  Dispense: 20 tablet; Refill: 0  Start zofran every 8 hours for next 1-2 days Force fluids If abdominal restarts or continues go to ED [facility] diet Follow up if symptoms worsen or do not improve   Follow Up Instructions: I discussed the assessment and treatment plan with the patient. The patient was provided an opportunity to ask questions and all were answered. The patient agreed with the plan and demonstrated an understanding of the instructions.  A copy of instructions were sent  to the patient via MyChart unless otherwise noted below.     The patient was advised to call back or seek an in-person evaluation if the symptoms worsen or if the condition fails to improve as anticipated.  Time:  I spent 12 minutes with the patient via telehealth technology discussing the above problems/concerns.    Jannifer Rodney, FNP

## 2022-10-31 ENCOUNTER — Other Ambulatory Visit: Payer: Self-pay

## 2022-10-31 ENCOUNTER — Emergency Department (HOSPITAL_COMMUNITY): Payer: Medicaid Other

## 2022-10-31 ENCOUNTER — Encounter (HOSPITAL_COMMUNITY): Payer: Self-pay | Admitting: Emergency Medicine

## 2022-10-31 ENCOUNTER — Emergency Department (HOSPITAL_COMMUNITY)
Admission: EM | Admit: 2022-10-31 | Discharge: 2022-10-31 | Disposition: A | Discharge status: Home / Self Care | Payer: Medicaid Other | Attending: Emergency Medicine | Admitting: Emergency Medicine

## 2022-10-31 DIAGNOSIS — R112 Nausea with vomiting, unspecified: Secondary | ICD-10-CM | POA: Diagnosis present

## 2022-10-31 DIAGNOSIS — R197 Diarrhea, unspecified: Secondary | ICD-10-CM | POA: Diagnosis not present

## 2022-10-31 DIAGNOSIS — K219 Gastro-esophageal reflux disease without esophagitis: Secondary | ICD-10-CM

## 2022-10-31 DIAGNOSIS — I10 Essential (primary) hypertension: Secondary | ICD-10-CM | POA: Insufficient documentation

## 2022-10-31 DIAGNOSIS — Z7984 Long term (current) use of oral hypoglycemic drugs: Secondary | ICD-10-CM | POA: Insufficient documentation

## 2022-10-31 DIAGNOSIS — Z79899 Other long term (current) drug therapy: Secondary | ICD-10-CM | POA: Insufficient documentation

## 2022-10-31 DIAGNOSIS — Z91040 Latex allergy status: Secondary | ICD-10-CM | POA: Diagnosis not present

## 2022-10-31 DIAGNOSIS — E119 Type 2 diabetes mellitus without complications: Secondary | ICD-10-CM | POA: Insufficient documentation

## 2022-10-31 LAB — CBC
HCT: 42.8 % (ref 36.0–46.0)
Hemoglobin: 13.6 g/dL (ref 12.0–15.0)
MCH: 28 pg (ref 26.0–34.0)
MCHC: 31.8 g/dL (ref 30.0–36.0)
MCV: 88.2 fL (ref 80.0–100.0)
Platelets: 225 10*3/uL (ref 150–400)
RBC: 4.85 MIL/uL (ref 3.87–5.11)
RDW: 12.7 % (ref 11.5–15.5)
WBC: 5.3 10*3/uL (ref 4.0–10.5)
nRBC: 0 % (ref 0.0–0.2)

## 2022-10-31 LAB — LIPASE, BLOOD: Lipase: 31 U/L (ref 11–51)

## 2022-10-31 LAB — COMPREHENSIVE METABOLIC PANEL
ALT: 18 U/L (ref 0–44)
AST: 21 U/L (ref 15–41)
Albumin: 3.7 g/dL (ref 3.5–5.0)
Alkaline Phosphatase: 48 U/L (ref 38–126)
Anion gap: 8 (ref 5–15)
BUN: 10 mg/dL (ref 6–20)
CO2: 24 mmol/L (ref 22–32)
Calcium: 8.7 mg/dL — ABNORMAL LOW (ref 8.9–10.3)
Chloride: 103 mmol/L (ref 98–111)
Creatinine, Ser: 0.82 mg/dL (ref 0.44–1.00)
GFR, Estimated: >60 mL/min (ref >60)
Glucose, Bld: 110 mg/dL — ABNORMAL HIGH (ref 70–99)
Potassium: 4 mmol/L (ref 3.5–5.1)
Sodium: 135 mmol/L (ref 135–145)
Total Bilirubin: 0.5 mg/dL (ref 0.3–1.2)
Total Protein: 7 g/dL (ref 6.5–8.1)

## 2022-10-31 LAB — URINALYSIS, ROUTINE W REFLEX MICROSCOPIC
Bilirubin Urine: NEGATIVE
Glucose, UA: NEGATIVE mg/dL
Hgb urine dipstick: NEGATIVE
Ketones, ur: NEGATIVE mg/dL
Leukocytes,Ua: NEGATIVE
Nitrite: NEGATIVE
Protein, ur: NEGATIVE mg/dL
Specific Gravity, Urine: 1.013 (ref 1.005–1.030)
pH: 6 (ref 5.0–8.0)

## 2022-10-31 MED ORDER — FENTANYL CITRATE PF 50 MCG/ML IJ SOSY
50.0000 ug | PREFILLED_SYRINGE | Freq: Once | INTRAMUSCULAR | Status: AC
  Administered 2022-10-31: 50 ug via INTRAVENOUS
  Filled 2022-10-31: qty 1

## 2022-10-31 MED ORDER — SODIUM CHLORIDE 0.9 % IV BOLUS
1000.0000 mL | Freq: Once | INTRAVENOUS | Status: AC
  Administered 2022-10-31: 1000 mL via INTRAVENOUS

## 2022-10-31 MED ORDER — PANTOPRAZOLE SODIUM 40 MG IV SOLR
40.0000 mg | Freq: Once | INTRAVENOUS | Status: AC
  Administered 2022-10-31: 40 mg via INTRAVENOUS
  Filled 2022-10-31: qty 10

## 2022-10-31 MED ORDER — PANTOPRAZOLE SODIUM 40 MG PO TBEC: 40.0000 mg | DELAYED_RELEASE_TABLET | Freq: Every day | ORAL | 0 refills | Status: DC

## 2022-10-31 MED ORDER — ONDANSETRON HCL 4 MG/2ML IJ SOLN
4.0000 mg | Freq: Once | INTRAMUSCULAR | Status: AC
  Administered 2022-10-31: 4 mg via INTRAVENOUS
  Filled 2022-10-31: qty 2

## 2022-10-31 NOTE — ED Triage Notes (Signed)
Pt reporting n/v/d with epigastric pain and gas x 3 days. She thinks she may have a stomach flu. Denies additional symptoms. Estimates 2 episodes of emesis and 2 of diarrhea in the last 24 hours after pt was unable to keep anything down for the previous 2 days. Denies additional symptoms.

## 2022-10-31 NOTE — Discharge Instructions (Signed)
Bland diet for several days, avoid spicy greasy foods and alcohol.  Stop your Prilosec and start the pantoprazole and take as directed for 2 weeks.  Please follow-up with your primary care provider for recheck.  Return to the emergency department for any new or worsening symptoms.

## 2022-10-31 NOTE — ED Provider Notes (Signed)
[place] EMERGENCY DEPARTMENT AT [facility] Provider Note   CSN: [id number] Arrival date & time: 10/31/22  1333     History {Add pertinent medical, surgical, social history, OB history to HPI:1} Chief Complaint  Patient presents with   Emesis   Diarrhea   Abdominal Pain    Cheryl Randall is a 47 y.o. female.   Emesis Associated symptoms: abdominal pain and diarrhea   Associated symptoms: no chills, no fever and no sore throat   Diarrhea Associated symptoms: abdominal pain and vomiting   Associated symptoms: no chills and no fever   Abdominal Pain Associated symptoms: diarrhea, nausea and vomiting   Associated symptoms: no chest pain, no chills, no dysuria, no fever, no shortness of breath and no sore throat         Cheryl Randall is a 47 y.o. female past medical history of type 2 diabetes, anxiety, hypertension, chronic pain syndrome, who presents to the Emergency Department complaining of nausea, dry heaving, loose stools and epigastric pain x 4 days.  States that she went to a event last weekend and some of the people there had recently had stomach flu.  She endorses waves of sharp to dull pain of her upper abdomen.  Several loose brown stools, nonbloody or black.  No hematemesis.  Decreased appetite along with "sour burps" and excessive gas.  She takes over-the-counter Prilosec and tried Tums both without relief.  No fever, chills, chest pain, shortness of breath, flank pain or dysuria.  History of prior cholecystectomy.   Home Medications Prior to Admission medications   Medication Sig Start Date End Date Taking? Authorizing Provider  acetaminophen (TYLENOL) 500 MG tablet Take 500 mg by mouth every 6 (six) hours as needed.    [provider]  acyclovir (ZOVIRAX) 400 MG tablet Take 1 tablet (400 mg total) by mouth 3 (three) times daily. For 5 days at first sign of cold sores flare 08/04/22   Delynn Flavin M, DO  albuterol (VENTOLIN HFA) 108 (90  Base) MCG/ACT inhaler Inhale 1-2 puffs into the lungs every 6 (six) hours as needed for wheezing or shortness of breath. 07/07/22   Jannifer Rodney A, FNP  Blood Glucose Monitoring Suppl DEVI Check sugars up to 2 times daily. May substitute to any manufacturer covered by patient's insurance. E11.9 08/04/22   Raliegh Ip, DO  cetirizine (ZYRTEC) 10 MG tablet Take 1 tablet (10 mg total) by mouth daily. 06/19/22   Raliegh Ip, DO  Cholecalciferol 1.25 MG (50000 UT) capsule Take 1 capsule (50,000 Units total) by mouth every 7 (seven) days. X12 weeks. Then start OTC VIt D 400 IU daily. 08/05/22   Raliegh Ip, DO  citalopram (CELEXA) 40 MG tablet TAKE 1 Tablet BY MOUTH ONCE DAILY FOR MOOD 08/04/22   Delynn Flavin M, DO  Glucose Blood (BLOOD GLUCOSE TEST STRIPS) STRP Check sugar up to 2 times daily. May substitute to any manufacturer covered by patient's insurance. E11.9 08/04/22   Raliegh Ip, DO  Lancet Device MISC Check sugar up to 2 times daily. May substitute to any manufacturer covered by patient's insurance. E11.9 08/04/22   Raliegh Ip, DO  Lancets Misc. MISC Check sugar up to 2 times daily. May substitute to any manufacturer covered by patient's insurance.E11.9 08/04/22   Delynn Flavin M, DO  lidocaine (ASPERCREME LIDOCAINE) 4 % Place 1 patch onto the skin daily.    [provider]  metFORMIN (GLUCOPHAGE) 500 MG tablet Take 1  tablet (500 mg total) by mouth daily with breakfast. 08/04/22   Delynn Flavin M, DO  methocarbamol (ROBAXIN) 750 MG tablet TAKE 1 TABLET BY MOUTH EVERY 8 HOURS AS NEEDED FOR MUSCLE SPASMS 09/01/22   Delynn Flavin M, DO  metoprolol tartrate (LOPRESSOR) 50 MG tablet TAKE 1 Tablet  BY MOUTH TWICE DAILY FOR HEART 08/04/22   Delynn Flavin M, DO  omeprazole (PRILOSEC) 20 MG capsule Take 1 capsule (20 mg total) by mouth daily. 08/04/22   Raliegh Ip, DO  ondansetron (ZOFRAN-ODT) 4 MG disintegrating tablet Take 1 tablet (4 mg total) by  mouth every 8 (eight) hours as needed for nausea or vomiting. 10/30/22   Junie Spencer, FNP  pregabalin (LYRICA) 50 MG capsule Take 1 capsule (50 mg total) by mouth 2 (two) times daily. Place on file 08/04/22   Raliegh Ip, DO  Semaglutide,0.25 or 0.5MG /DOS, (OZEMPIC, 0.25 OR 0.5 MG/DOSE,) 2 MG/3ML SOPN Inject 0.5 mg into the skin once a week. 10/16/22   Raliegh Ip, DO  SUMAtriptan (IMITREX) 50 MG tablet Take 1 tablet (50 mg total) by mouth daily as needed for migraine. May repeat in 4 hours if headache persists or recurs. 08/04/22   Raliegh Ip, DO      Allergies    Codeine, Latex, Norco [hydrocodone-acetaminophen], and Tape    Review of Systems   Review of Systems  Constitutional:  Positive for appetite change. Negative for chills and fever.  HENT:  Negative for sore throat.   Respiratory:  Negative for shortness of breath.   Cardiovascular:  Negative for chest pain.  Gastrointestinal:  Positive for abdominal pain, diarrhea, nausea and vomiting. Negative for abdominal distention.  Genitourinary:  Negative for difficulty urinating, dysuria and flank pain.  Musculoskeletal:  Negative for back pain.  Skin:  Negative for rash.  Neurological:  Negative for weakness.    Physical Exam Updated Vital Signs BP 118/81   Pulse 76   Temp 98.2 F (36.8 C)   Resp 18   Ht 5\' 7"  (1.702 m)   Wt 131.5 kg   LMP 02/24/2013   SpO2 99%   BMI 45.42 kg/m  Physical Exam Vitals and nursing note reviewed.  Constitutional:      General: She is not in acute distress.    Appearance: She is well-developed. She is not ill-appearing or toxic-appearing.  HENT:     Mouth/Throat:     Mouth: Mucous membranes are moist.  Cardiovascular:     Rate and Rhythm: Normal rate and regular rhythm.     Pulses: Normal pulses.  Pulmonary:     Effort: Pulmonary effort is normal.  Abdominal:     General: There is no distension.     Palpations: Abdomen is soft. There is no mass.     Tenderness:  There is abdominal tenderness. There is no guarding or rebound.     Comments: Mild epigastric tenderness with palpation.  No guarding or rebound tenderness.  Abdomen is soft  Musculoskeletal:     Right lower leg: No edema.     Left lower leg: No edema.  Skin:    General: Skin is warm.     Capillary Refill: Capillary refill takes less than 2 seconds.  Neurological:     General: No focal deficit present.     Mental Status: She is alert.     Motor: No weakness.     ED Results / Procedures / Treatments   Labs (all labs ordered are listed, but  only abnormal results are displayed) Labs Reviewed  COMPREHENSIVE METABOLIC PANEL - Abnormal; Notable for the following components:      Result Value   Glucose, Bld 110 (*)    Calcium 8.7 (*)    All other components within normal limits  LIPASE, BLOOD  CBC  URINALYSIS, ROUTINE W REFLEX MICROSCOPIC    EKG None  Radiology No results found.  Procedures Procedures  {Document cardiac monitor, telemetry assessment procedure when appropriate:1}  Medications Ordered in ED Medications  sodium chloride 0.9 % bolus 1,000 mL (has no administration in time range)  ondansetron (ZOFRAN) injection 4 mg (has no administration in time range)  pantoprazole (PROTONIX) injection 40 mg (has no administration in time range)    ED Course/ Medical Decision Making/ A&P   {   Click here for ABCD2, HEART and other calculatorsREFRESH Note before signing :1}                          Medical Decision Making Patient here with reported "waves" of dull to sharp abdominal pain worse with dry heaving.  Loose stools as well.  Began after eating at a event last weekend notes several people there recently had stomach flu.  She denies any fever, chest pain, shortness of breath no dysuria symptoms.  PCP called in Zofran 2 days ago that she has been taking without relief.  Tried over-the-counter Tums as well without improvement  Differential would include but not  limited to acute abdominal process, pancreatitis, viral process, GERD/esophagitis  Amount and/or Complexity of Data Reviewed Labs: ordered.  Risk Prescription drug management.     {Document critical care time when appropriate:1} {Document review of labs and clinical decision tools ie heart score, Chads2Vasc2 etc:1}  {Document your independent review of radiology images, and any outside records:1} {Document your discussion with family members, caretakers, and with consultants:1} {Document social determinants of health affecting pt's care:1} {Document your decision making why or why not admission, treatments were needed:1} Final Clinical Impression(s) / ED Diagnoses Final diagnoses:  None    Rx / DC Orders ED Discharge Orders     None

## 2022-11-07 ENCOUNTER — Encounter: Payer: Self-pay | Admitting: Family Medicine

## 2022-11-07 ENCOUNTER — Ambulatory Visit (INDEPENDENT_AMBULATORY_CARE_PROVIDER_SITE_OTHER): Payer: Medicaid Other | Admitting: Family Medicine

## 2022-11-07 VITALS — BP 126/86 | HR 66 | Temp 98.5°F | Ht 67.0 in | Wt 289.0 lb

## 2022-11-07 DIAGNOSIS — E1169 Type 2 diabetes mellitus with other specified complication: Secondary | ICD-10-CM | POA: Diagnosis not present

## 2022-11-07 DIAGNOSIS — E1159 Type 2 diabetes mellitus with other circulatory complications: Secondary | ICD-10-CM

## 2022-11-07 DIAGNOSIS — R300 Dysuria: Secondary | ICD-10-CM | POA: Diagnosis not present

## 2022-11-07 DIAGNOSIS — K760 Fatty (change of) liver, not elsewhere classified: Secondary | ICD-10-CM

## 2022-11-07 DIAGNOSIS — N3000 Acute cystitis without hematuria: Secondary | ICD-10-CM | POA: Diagnosis not present

## 2022-11-07 DIAGNOSIS — K219 Gastro-esophageal reflux disease without esophagitis: Secondary | ICD-10-CM | POA: Diagnosis not present

## 2022-11-07 DIAGNOSIS — I152 Hypertension secondary to endocrine disorders: Secondary | ICD-10-CM | POA: Diagnosis not present

## 2022-11-07 DIAGNOSIS — Z7984 Long term (current) use of oral hypoglycemic drugs: Secondary | ICD-10-CM | POA: Diagnosis not present

## 2022-11-07 LAB — MICROSCOPIC EXAMINATION
Epithelial Cells (non renal): NONE SEEN /hpf (ref 0–10)
RBC, Urine: >30 /hpf — AB (ref 0–2)
Renal Epithel, UA: NONE SEEN /hpf
WBC, UA: >30 /hpf — AB (ref 0–5)

## 2022-11-07 LAB — URINALYSIS, ROUTINE W REFLEX MICROSCOPIC
Bilirubin, UA: NEGATIVE
Glucose, UA: NEGATIVE
Ketones, UA: NEGATIVE
Nitrite, UA: NEGATIVE
Specific Gravity, UA: 1.025 (ref 1.005–1.030)
Urobilinogen, Ur: 0.2 mg/dL (ref 0.2–1.0)
pH, UA: 7 (ref 5.0–7.5)

## 2022-11-07 LAB — BAYER DCA HB A1C WAIVED: HB A1C (BAYER DCA - WAIVED): 5.6 % (ref 4.8–5.6)

## 2022-11-07 MED ORDER — ROSUVASTATIN CALCIUM 10 MG PO TABS
10.0000 mg | ORAL_TABLET | Freq: Every day | ORAL | 3 refills | Status: DC
Start: 2022-11-07 — End: 2023-06-15

## 2022-11-07 MED ORDER — SEMAGLUTIDE (1 MG/DOSE) 4 MG/3ML ~~LOC~~ SOPN
1.0000 mg | PEN_INJECTOR | SUBCUTANEOUS | 3 refills | Status: DC
Start: 2022-11-07 — End: 2022-12-29

## 2022-11-07 MED ORDER — PANTOPRAZOLE SODIUM 40 MG PO TBEC: 40.0000 mg | DELAYED_RELEASE_TABLET | Freq: Every day | ORAL | 3 refills | Status: DC

## 2022-11-07 MED ORDER — CEPHALEXIN 500 MG PO CAPS
500.0000 mg | ORAL_CAPSULE | Freq: Two times a day (BID) | ORAL | 0 refills | Status: AC
Start: 2022-11-07 — End: 2022-11-14

## 2022-11-07 NOTE — Progress Notes (Signed)
Subjective: CC:Dm PCP: Raliegh Ip, DO NFA:OZHYQ D Hori is a 48 y.o. female presenting to clinic today for:  1. Type 2 Diabetes with hypertension and fatty liver/ UTI symptoms:  She is compliant with medications.  She admits that she had recent GI distress and this is felt to be viral in nature though they did identify fatty liver on her exam and she is interested in further evaluating this.  She has had about a 19 pound weight loss so far and has noticed improvement in blood sugars.  She has had no hypoglycemic episodes and home blood sugars are typically running in the 100s.  Last eye exam: Up-to-date Last foot exam: Up-to-date Last A1c:  Lab Results  Component Value Date   HGBA1C 7.1 (H) 08/04/2022   Nephropathy screen indicated?:  Needs Last flu, zoster and/or pneumovax:  Immunization History  Administered Date(s) Administered   Influenza,inj,Quad PF,6+ Mos 04/01/2021, 03/12/2022   Influenza-Unspecified 03/10/2017   PFIZER(Purple Top)SARS-COV-2 Vaccination 09/08/2019, 10/15/2019    ROS: Denies dizziness, LOC, polydipsia.  She does report urinary frequency, dysuria.  No hematuria, fevers.   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    Migraine    Palpitations 07/26/2019   Panic attack 06/26/2017   PONV (postoperative nausea and vomiting)    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 For 5 days at first sign of cold sores flare, Disp: 30 tablet, Rfl: 0   albuterol (VENTOLIN HFA) 108 (90 Base) MCG/ACT inhaler, Inhale 1-2 puffs into the lungs every 6 (six) hours as needed for wheezing or shortness of breath., Disp: 1 each, Rfl: 0   Blood Glucose Monitoring Suppl DEVI, Check sugars up to 2 times daily. May substitute to any manufacturer covered by patient's insurance. E11.9, Disp: 1 each, Rfl: 0   cetirizine (ZYRTEC) 10 MG tablet, Take 1 tablet (10 mg total) by mouth daily., Disp: 30 tablet, Rfl: 11   Cholecalciferol 1.25 MG (50000 UT) capsule, Take 1 capsule (50,000 Units total) by mouth every 7 (seven) days. X12 weeks. Then start OTC VIt D 400 IU daily., Disp: 12 capsule, Rfl: 0   citalopram (CELEXA) 40 MG tablet, TAKE 1 Tablet BY MOUTH ONCE DAILY FOR MOOD, Disp: 90 tablet, Rfl: 3   Glucose Blood (BLOOD GLUCOSE TEST STRIPS) STRP, Check sugar up to 2 times daily. May substitute to any manufacturer covered by patient's insurance. E11.9,  Disp: 100 strip, Rfl: 3   Lancet Device MISC, Check sugar up to 2 times daily. May substitute to any manufacturer covered by patient's insurance. E11.9, Disp: 1 each, Rfl: 0   Lancets Misc. MISC, Check sugar up to 2 times daily. May substitute to any manufacturer covered by patient's insurance.E11.9, Disp: 100 each, Rfl: 3   lidocaine (ASPERCREME LIDOCAINE) 4 %, Place 1 patch onto the skin daily., Disp: , Rfl:    metFORMIN (GLUCOPHAGE) 500 MG tablet, Take 1 tablet (500 mg total) by  mouth daily with breakfast., Disp: 90 tablet, Rfl: 0   methocarbamol (ROBAXIN) 750 MG tablet, TAKE 1 TABLET BY MOUTH EVERY 8 HOURS AS NEEDED FOR MUSCLE SPASMS, Disp: 90 tablet, Rfl: 0   metoprolol tartrate (LOPRESSOR) 50 MG tablet, TAKE 1 Tablet  BY MOUTH TWICE DAILY FOR HEART, Disp: 180 tablet, Rfl: 3   ondansetron (ZOFRAN-ODT) 4 MG disintegrating tablet, Take 1 tablet (4 mg total) by mouth every 8 (eight) hours as needed for nausea or vomiting., Disp: 20 tablet, Rfl: 0   pantoprazole (PROTONIX) 40 MG tablet, Take 1 tablet (40 mg total) by mouth daily., Disp: 14 tablet, Rfl: 0   pregabalin (LYRICA) 50 MG capsule, Take 1 capsule (50 mg total) by mouth 2 (two) times daily. Place on file, Disp: 180 capsule, Rfl: 1   Semaglutide,0.25 or 0.5MG /DOS, (OZEMPIC, 0.25 OR 0.5 MG/DOSE,) 2 MG/3ML SOPN, Inject 0.5 mg into the skin once a week., Disp: 9 mL, Rfl: 0   SUMAtriptan (IMITREX) 50 MG tablet, Take 1 tablet (50 mg total) by mouth daily as needed for migraine. May repeat in 4 hours if headache persists or recurs., Disp: 6 tablet, Rfl: PRN Social History   Socioeconomic History   Marital status: Divorced    Spouse name: Not on file   Number of children: 1   Years of education: Not on file   Highest education level: Some college, no degree  Occupational History   Not on file  Tobacco Use   Smoking status: Former    Types: Cigarettes    Quit date: 06/02/2014    Years since quitting: 8.4   Smokeless tobacco: Never  Vaping Use   Vaping Use: Never used  Substance and Sexual Activity   Alcohol use: No   Drug use: Not Currently    Types: Marijuana    Comment: none since a teenager   Sexual activity: Not Currently    Birth control/protection: Surgical  Other Topics Concern   Not on file  Social History Narrative   Not on file   Social Determinants of Health   Financial Resource Strain: Not on file  Food Insecurity: No Food Insecurity (02/28/2022)   Hunger Vital Sign    Worried About Running  Out of Food in the Last Year: Never true    Ran Out of Food in the Last Year: Never true  Transportation Needs: No Transportation Needs (02/28/2022)   PRAPARE - Administrator, Civil Service (Medical): No    Lack of Transportation (Non-Medical): No  Physical Activity: Not on file  Stress: Not on file  Social Connections: Not on file  Intimate Partner Violence: Not on file   Family History  Problem Relation Age of Onset   Heart disease Mother    Diabetes Mother    Cancer Father        bladder   Autism Brother    Diabetes Brother    Heart disease Brother    Diabetes Brother  Diabetes Brother     Objective: Office vital signs reviewed. BP 126/86   Pulse 66   Temp 98.5 F (36.9 C)   Ht 5\' 7"  (1.702 m)   Wt 289 lb (131.1 kg)   LMP 02/24/2013   SpO2 94%   BMI 45.26 kg/m   Physical Examination:  General: Awake, alert, well nourished, No acute distress HEENT: sclera white, MMM Cardio: regular rate and rhythm, S1S2 heard, no murmurs appreciated Pulm: clear to auscultation bilaterally, no wheezes, rhonchi or rales; normal work of breathing on room air GI: obese, soft, non-tender, non-distended, bowel sounds present x4, no hepatomegaly, no splenomegaly, no masses GU: Mild suprapubic tenderness to palpation  Lab Results  Component Value Date   HGBA1C 5.6 11/07/2022   US Abdomen Complete  Result Date: 10/31/2022 CLINICAL DATA:  Abdominal pain for 3 days.  Prior cholecystectomy. EXAM: ABDOMEN ULTRASOUND COMPLETE COMPARISON:  Renal stone CT 01/13/2016 FINDINGS: Gallbladder: Previous cholecystectomy. Common bile duct: Diameter: 9 mm. Within normal limits for the post cholecystectomy state. Liver: Diffusely echogenic hepatic parenchyma consistent with fatty liver infiltration. With this level of echogenicity evaluation for underlying mass lesion is limited and if needed follow-up contrast CT or MRI as clinically directed. Slightly nodular contour as well. Portal vein is  patent on color Doppler imaging with normal direction of blood flow towards the liver. IVC: No abnormality visualized. Pancreas: Visualized portion unremarkable. Spleen: 11.6 cm in length.  Preserved echotexture. Right Kidney: Length: 10.5 cm. No collecting system dilatation or perinephric fluid. 7 mm echogenic area identified in the upper pole but no shadowing. This could be interface artifact rather than stone. Left Kidney: Length: 10.4 cm. No collecting system dilatation or perinephric fluid. Abdominal aorta: No aneurysm visualized. Other findings: Study significantly limited by overlapping bowel gas and soft tissue. Patient also had difficulty tolerating the procedure as per the sonographer. Additional cross-sectional imaging workup as clinically appropriate IMPRESSION: Limited study as above. Previous cholecystectomy. Common duct at 9 mm but within normal limits for the post cholecystectomy state. Heterogeneous echogenic liver consistent with fatty infiltration. Electronically Signed   By: Karen Kays M.D.   On: 10/31/2022 17:36    Assessment/ Plan: 47 y.o. female   Type 2 diabetes mellitus with other specified complication, without long-term current use of insulin (HCC) - Plan: Microalbumin / creatinine urine ratio, Bayer DCA Hb A1c Waived, Semaglutide, 1 MG/DOSE, 4 MG/3ML SOPN  Hypertension associated with diabetes (HCC)  Morbid obesity (HCC)  Acute cystitis without hematuria - Plan: Urinalysis, Routine w reflex microscopic, Urine Culture, cephALEXin (KEFLEX) 500 MG capsule  Fatty liver - Plan: Ambulatory referral to Gastroenterology, rosuvastatin (CRESTOR) 10 MG tablet, Semaglutide, 1 MG/DOSE, 4 MG/3ML SOPN  Gastroesophageal reflux disease without esophagitis - Plan: pantoprazole (PROTONIX) 40 MG tablet  Sugar now under excellent control with A1c dropping down to 5.6.  She has had an excellent response to Ozempic.  Given the fatty liver findings, will advance Ozempic to try and achieve  better clinical outcomes.  I have ordered 1 mg subcutaneously each week.  I will plan to see her back in 3 to 6 months, sooner if concerns arise  Blood pressure well-controlled.  No changes  Keflex sent in for UTI.  Sent for culture  Crestor also sent in for fatty liver.  Protonix reordered as this was helpful after ER visit  No orders of the defined types were placed in this encounter.  No orders of the defined types were placed in this encounter.  Raliegh Ip, DO [facility] [phone]

## 2022-11-07 NOTE — Patient Instructions (Addendum)
08/05/22   Thank you for referring Cheryl Randall ([birth year]) to our office for a screening colonoscopy   We have a mailed a colonoscopy questionnaire to your patient. Once we receive the questionnaire back from the patient we will get them scheduled and fax you with appointment date.   If patient does not respond within 6 months we will discard the referral and let you know patient didn't respond.   If you  have questions don't hesitate to call   Thank you,   Melonie Florida at [employer] for success with ozempic (and by success, how not to be super sick on your stomach): Eat small meals AVOID heavy foods (fried/ high in carbs like bread, pasta, rice) AVOID carbonated beverages (soda/ beer, as these can increase bloating) DOUBLE your water intake (will help you avoid constipation/ dehydration)  Ozempic CAN cause: Nausea Abdominal pain Increased acid reflux (sometimes presents as "sour burps") Constipation OR Diarrhea Fatigue (especially when you first start it)

## 2022-11-08 LAB — MICROALBUMIN / CREATININE URINE RATIO
Creatinine, Urine: 145.4 mg/dL
Microalb/Creat Ratio: 198 mg/g creat — ABNORMAL HIGH (ref 0–29)
Microalbumin, Urine: 288.4 ug/mL

## 2022-11-10 ENCOUNTER — Telehealth: Payer: Self-pay | Admitting: Family Medicine

## 2022-11-10 NOTE — Telephone Encounter (Signed)
Patient said the pharmacy told her that she had blood pressure medication but this was not discussed with her. Would like to speak to nurse about it.

## 2022-11-10 NOTE — Telephone Encounter (Signed)
Patient calling back because she had not heard back. The medication that was called in was rosuvastatin (CRESTOR) 10 MG tablet, explained to the patient that per the notes the medication was called in for cholesterol. She would like to know why this medication was called in because when she spoke to the nurse about her lab results, she was told that everything was good and they did not mention her being started on any kind of new medications. Please call back and advise.

## 2022-11-10 NOTE — Telephone Encounter (Signed)
Called patient mother answer the phone because [place]. PEr DPR okay to speak with patient per Dr. Reece Agar last note she sent in Crestor for Crestor also sent in for fatty liver. Mother aware and verbalized understanding will call us back need anything else

## 2022-11-11 LAB — URINE CULTURE

## 2022-11-12 ENCOUNTER — Ambulatory Visit: Payer: Medicaid Other | Admitting: Family Medicine

## 2022-11-18 ENCOUNTER — Telehealth: Payer: Self-pay | Admitting: Family Medicine

## 2022-11-18 ENCOUNTER — Other Ambulatory Visit: Payer: Self-pay | Admitting: Family Medicine

## 2022-11-18 DIAGNOSIS — N3000 Acute cystitis without hematuria: Secondary | ICD-10-CM

## 2022-11-18 MED ORDER — NITROFURANTOIN MONOHYD MACRO 100 MG PO CAPS
100.0000 mg | ORAL_CAPSULE | Freq: Two times a day (BID) | ORAL | 0 refills | Status: AC
Start: 2022-11-18 — End: 2022-11-25

## 2022-11-18 NOTE — Telephone Encounter (Signed)
  Incoming Patient Call  11/18/2022  What symptoms do you have? Pain when she urinates  How long have you been sick? A couple of days  Have you been seen for this problem? Yes a few weeks ago, pt was seen and antibiotic was prescribed was told to call back if she still had symptoms and another antibiotic would be prescribed. Pt has been keeping a UTI since being on ozempic  If your provider decides to give you a prescription, which pharmacy would you like for it to be sent to? [facility]  Patient informed that this information will be sent to the clinical staff for review and that they should receive a follow up call.

## 2022-11-18 NOTE — Telephone Encounter (Signed)
Rx sent. Follow up in office if does not resolve.

## 2022-11-18 NOTE — Telephone Encounter (Signed)
Patient will call if symptoms do not resolve

## 2022-11-19 NOTE — Progress Notes (Unsigned)
GI Office Note    Referring Provider: Raliegh Ip, DO Primary Care Physician:  Raliegh Ip, DO  Primary Gastroenterologist: Hennie Duos. Marletta Lor, DO  Chief Complaint   Chief Complaint  Patient presents with   fatty liver    History of Present Illness   Cheryl Randall is a 47 y.o. female presenting today at the request of Gottschalk, Ashly M, DO for fatty liver.   ED visit 10/31/2022 for epigastric pain, nausea, and dry heaving.  Having sour burps, excessive gas and has tried over-the-counter Prilosec and Tums without good relief.  Was treated with IV fluids and IV Zofran with relief.  Advise she may benefit from EGD if symptoms are not improved with PPI.  Abdominal ultrasound and labs as below.  Abdominal US 10/31/22: -cholecystectomy, CBD 9mm (normal for absence of gallbladder) -echogenic liver (fatty infiltration) [With this level of echogenicity evaluation for underlying mass lesion is limited] -normal liver doppler -slightly nodular contour -consider CT or MRI for further evaluation to rule out mass/malignancy  Labs 10/31/22: normal LFTs, Lipase wnl. CBC wnl.   Today:  Diagnosed with diabetes a few months ago and was started on metformin initially and then transition to Ozempic.   Has been nauseas all the time with the Ozempic (since on the higher dose of 7) and was given Zofran which is not really taking the edge off. Today is not nauseas. Due to take shot today. Ginger ale is the only thing that is helpful for her stomach. No vomiting. No dysphagia.   Is on pantoprazole. Has a lot of acid reflux. Had it prior to going on the Ozempic and used to take omeprazole otc. States she went to the hospital. Does have some regurgitation of acid contents at times with burps. And states they are foul smelling at times.   Her brother had fatty liver which progressed to cirrhosis.   Was at 308 initially. Has lost 20 lbs in 3 months. Had gained about 110 lbs over a couple  years.   Has had 4 back surgeries so her mobility is limited.   Has never had a colonoscopy. No FH of colon cancer or colon polyps.   Normal bowel habits vary - not able to eat salad on ozempic (makes her more nauseas). No melena or brbpr.   Current Outpatient Medications  Medication Sig Dispense Refill   acetaminophen (TYLENOL) 500 MG tablet Take 500 mg by mouth every 6 (six) hours as needed.     acyclovir (ZOVIRAX) 400 MG tablet Take 1 tablet (400 mg total) by mouth 3 (three) times daily. For 5 days at first sign of cold sores flare 30 tablet 0   albuterol (VENTOLIN HFA) 108 (90 Base) MCG/ACT inhaler Inhale 1-2 puffs into the lungs every 6 (six) hours as needed for wheezing or shortness of breath. 1 each 0   Blood Glucose Monitoring Suppl DEVI Check sugars up to 2 times daily. May substitute to any manufacturer covered by patient's insurance. E11.9 1 each 0   citalopram (CELEXA) 40 MG tablet TAKE 1 Tablet BY MOUTH ONCE DAILY FOR MOOD 90 tablet 3   Glucose Blood (BLOOD GLUCOSE TEST STRIPS) STRP Check sugar up to 2 times daily. May substitute to any manufacturer covered by patient's insurance. E11.9 100 strip 3   Lancet Device MISC Check sugar up to 2 times daily. May substitute to any manufacturer covered by patient's insurance. E11.9 1 each 0   Lancets Misc. MISC Check sugar up to  2 times daily. May substitute to any manufacturer covered by patient's insurance.E11.9 100 each 3   lidocaine (ASPERCREME LIDOCAINE) 4 % Place 1 patch onto the skin daily.     methocarbamol (ROBAXIN) 750 MG tablet TAKE 1 TABLET BY MOUTH EVERY 8 HOURS AS NEEDED FOR MUSCLE SPASMS 90 tablet 0   metoprolol tartrate (LOPRESSOR) 50 MG tablet TAKE 1 Tablet  BY MOUTH TWICE DAILY FOR HEART 180 tablet 3   nitrofurantoin, macrocrystal-monohydrate, (MACROBID) 100 MG capsule Take 1 capsule (100 mg total) by mouth 2 (two) times daily for 7 days. 14 capsule 0   ondansetron (ZOFRAN-ODT) 4 MG disintegrating tablet Take 1 tablet (4  mg total) by mouth every 8 (eight) hours as needed for nausea or vomiting. 20 tablet 0   pantoprazole (PROTONIX) 40 MG tablet Take 1 tablet (40 mg total) by mouth daily. 90 tablet 3   pregabalin (LYRICA) 50 MG capsule Take 1 capsule (50 mg total) by mouth 2 (two) times daily. Place on file 180 capsule 1   rosuvastatin (CRESTOR) 10 MG tablet Take 1 tablet (10 mg total) by mouth daily. For cholesterol/ fatty liver 90 tablet 3   Semaglutide, 1 MG/DOSE, 4 MG/3ML SOPN Inject 1 mg as directed once a week. 9 mL 3   SUMAtriptan (IMITREX) 50 MG tablet Take 1 tablet (50 mg total) by mouth daily as needed for migraine. May repeat in 4 hours if headache persists or recurs. 6 tablet PRN   metFORMIN (GLUCOPHAGE) 500 MG tablet Take 1 tablet (500 mg total) by mouth daily with breakfast. (Patient not taking: Reported on 11/20/2022) 90 tablet 0   No current facility-administered medications for this visit.   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   Migraine    Palpitations 07/26/2019   Panic attack 06/26/2017   PONV (postoperative nausea and vomiting)    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   Staphylococcus aureus bacteremia with sepsis (HCC)    Wound infection 06/30/2015    Past Surgical History:  Procedure Laterality Date   ABDOMINAL HYSTERECTOMY     BACK SURGERY     X 2   BREAST SURGERY Bilateral    reduction   CHOLECYSTECTOMY     LUMBAR  LAMINECTOMY/DECOMPRESSION MICRODISCECTOMY N/A 06/30/2015   Procedure: wound exploration irrigation and debridement ,explantation of bone growth stimulator, placement of wound vac;  Surgeon: Loura Halt Ditty, MD;  Location: [facility];  Service: Neurosurgery;  Laterality: N/A;    Family History  Problem Relation Age of Onset   Heart disease Mother    Diabetes Mother    Cancer Father        bladder   Autism Brother    Diabetes Brother    Heart disease Brother    Diabetes Brother    Diabetes Brother     Allergies as of 11/20/2022 - Review Complete 11/20/2022  Allergen Reaction Noted   Codeine Itching and Rash 06/30/2015   Latex Itching  and Rash 06/30/2015   Norco [hydrocodone-acetaminophen] Itching and Rash 06/30/2015   Tape Itching and Rash 06/30/2015    Social History   Socioeconomic History   Marital status: Divorced    Spouse name: Not on file   Number of children: 1   Years of education: Not on file   Highest education level: Some college, no degree  Occupational History   Not on file  Tobacco Use   Smoking status: Former    Types: Cigarettes    Quit date: 06/02/2014    Years since quitting: 8.4   Smokeless tobacco: Never  Vaping Use   Vaping Use: Never used  Substance and Sexual Activity   Alcohol use: No   Drug use: Not Currently    Types: Marijuana    Comment: none since a teenager   Sexual activity: Not Currently    Birth control/protection: Surgical  Other Topics Concern   Not on file  Social History Narrative   Not on file   Social Determinants of Health   Financial Resource Strain: Not on file  Food Insecurity: No Food Insecurity (02/28/2022)   Hunger Vital Sign    Worried About Running Out of Food in the Last Year: Never true    Ran Out of Food in the Last Year: Never true  Transportation Needs: No Transportation Needs (02/28/2022)   PRAPARE - Administrator, Civil Service (Medical): No    Lack of Transportation (Non-Medical): No   Physical Activity: Not on file  Stress: Not on file  Social Connections: Not on file  Intimate Partner Violence: Not on file     Review of Systems   Gen: Denies any fever, chills, fatigue, weight loss, lack of appetite.  CV: Denies chest pain, heart palpitations, peripheral edema, syncope.  Resp: Denies shortness of breath at rest or with exertion. Denies wheezing or cough.  GI: see HPI GU : Denies urinary burning, urinary frequency, urinary hesitancy MS: + back pain. Denies muscle weakness, cramps, or limitation of movement.  Derm: Denies rash, itching, dry skin Psych: Denies depression, anxiety, memory loss, and confusion Heme: Denies bruising, bleeding, and enlarged lymph nodes.   Physical Exam   BP 110/68 (BP Location: Right Arm, Patient Position: Sitting, Cuff Size: Large) Comment (Cuff Size): thigh cuff  Pulse 83   Temp (!) 97.5 F (36.4 C) (Oral)   Ht 5\' 7"  (1.702 m)   Wt 287 lb 3.2 oz (130.3 kg)   LMP 02/24/2013   SpO2 95%   BMI 44.98 kg/m   General:   Alert and oriented. Pleasant and cooperative. Well-nourished and well-developed.  Head:  Normocephalic and atraumatic. Eyes:  Without icterus, sclera clear and conjunctiva pink.  Ears:  Normal auditory acuity. Mouth:  No deformity or lesions, oral mucosa pink.  Lungs:  Clear to auscultation bilaterally. No wheezes, rales, or rhonchi. No distress.  Heart:  S1, S2 present without murmurs appreciated.  Abdomen:  +BS, soft, non-tender and non-distended. No HSM noted - difficult given body habitus. No guarding or rebound. No masses appreciated.  Rectal:  Deferred  Msk:  Symmetrical without gross deformities. Normal posture. Extremities:  Without edema. Neurologic:  Alert and  oriented x4;  grossly normal neurologically. Skin:  Intact without significant lesions or rashes. Psych:  Alert and cooperative. Normal mood and affect.  Assessment   Cheryl Randall is a 47 y.o. female with a history of chronic back pain,  HTN, anxiety, IBS, and newly diagnosed diabetes presenting today for evaluation  of fatty liver and reflux.  Hepatis steatosis on imaging: Family history of fatty liver disease in her brother who ended up developing cirrhosis.  Abdominal ultrasound noting fatty liver, possibly some nodular contour.  May need to perform CT scan to further evaluate her liver after her upper endoscopy and colonoscopy.  Discussed fatty liver diet today.  Recent labs with normal LFTs.  Will plan to recheck these in 6 months.  She is actively working on weight loss.  GERD, nausea: Has been having frequent belching which she describes as foul-smelling, occasional regurgitation of acid with sour taste in her mouth.  Denies any vomiting or dysphagia.  Initially tried over-the-counter omeprazole and Tums without any relief of symptoms.  Has been on pantoprazole 40 mg once daily and still continues to have some intermittent symptoms.  Nausea is likely secondary to Ozempic given after a few days after her shot she has improvement of symptoms.  Today she is due for her next shot and does not have any nausea.  Advised that she needs discussed this with her primary care given she tolerated lower doses of Ozempic without side effects.  We will trial brief increase in PPI to twice daily.  Advised she may use Zofran up to 3 times a day as needed for nausea.  Screening colon cancer: Has never had colon cancer screening.  Denies any family history of colon cancer or colon polyps.  No alarm symptoms at this time.  Will proceed with scheduling first-ever screening colonoscopy.  PLAN   Proceed with upper endoscopy and colonoscopy with propofol by Dr. Marletta Lor in near future: the risks, benefits, and alternatives have been discussed with the patient in detail. The patient states understanding and desires to proceed. ASA 3 (BMI) Hold Ozempic Increase PPI to BID Zofran as needed Recommend 1-2# weight loss per week until ideal body weight through  exercise & diet. Low fat/cholesterol diet.   Avoid sweets, sodas, fruit juices, sweetened beverages like tea, etc. Gradually increase exercise from 15 min daily up to 1 hr per day 5 days/week. Limit alcohol use. HFP in 6 months.  Follow up in 4 months   Brooke Bonito, MSN, FNP-BC, AGACNP-BC [facility]

## 2022-11-19 NOTE — H&P (View-Only) (Signed)
GI Office Note    Referring Provider: Raliegh Ip, DO Primary Care Physician:  Raliegh Ip, DO  Primary Gastroenterologist: Hennie Duos. Marletta Lor, DO  Chief Complaint   Chief Complaint  Patient presents with   fatty liver    History of Present Illness   Cheryl Randall is a 47 y.o. female presenting today at the request of Gottschalk, Ashly M, DO for fatty liver.   ED visit 10/31/2022 for epigastric pain, nausea, and dry heaving.  Having sour burps, excessive gas and has tried over-the-counter Prilosec and Tums without good relief.  Was treated with IV fluids and IV Zofran with relief.  Advise she may benefit from EGD if symptoms are not improved with PPI.  Abdominal ultrasound and labs as below.  Abdominal US 10/31/22: -cholecystectomy, CBD 9mm (normal for absence of gallbladder) -echogenic liver (fatty infiltration) [With this level of echogenicity evaluation for underlying mass lesion is limited] -normal liver doppler -slightly nodular contour -consider CT or MRI for further evaluation to rule out mass/malignancy  Labs 10/31/22: normal LFTs, Lipase wnl. CBC wnl.   Today:  Diagnosed with diabetes a few months ago and was started on metformin initially and then transition to Ozempic.   Has been nauseas all the time with the Ozempic (since on the higher dose of 7) and was given Zofran which is not really taking the edge off. Today is not nauseas. Due to take shot today. Ginger ale is the only thing that is helpful for her stomach. No vomiting. No dysphagia.   Is on pantoprazole. Has a lot of acid reflux. Had it prior to going on the Ozempic and used to take omeprazole otc. States she went to the hospital. Does have some regurgitation of acid contents at times with burps. And states they are foul smelling at times.   Her brother had fatty liver which progressed to cirrhosis.   Was at 308 initially. Has lost 20 lbs in 3 months. Had gained about 110 lbs over a couple  years.   Has had 4 back surgeries so her mobility is limited.   Has never had a colonoscopy. No FH of colon cancer or colon polyps.   Normal bowel habits vary - not able to eat salad on ozempic (makes her more nauseas). No melena or brbpr.   Current Outpatient Medications  Medication Sig Dispense Refill   acetaminophen (TYLENOL) 500 MG tablet Take 500 mg by mouth every 6 (six) hours as needed.     acyclovir (ZOVIRAX) 400 MG tablet Take 1 tablet (400 mg total) by mouth 3 (three) times daily. For 5 days at first sign of cold sores flare 30 tablet 0   albuterol (VENTOLIN HFA) 108 (90 Base) MCG/ACT inhaler Inhale 1-2 puffs into the lungs every 6 (six) hours as needed for wheezing or shortness of breath. 1 each 0   Blood Glucose Monitoring Suppl DEVI Check sugars up to 2 times daily. May substitute to any manufacturer covered by patient's insurance. E11.9 1 each 0   citalopram (CELEXA) 40 MG tablet TAKE 1 Tablet BY MOUTH ONCE DAILY FOR MOOD 90 tablet 3   Glucose Blood (BLOOD GLUCOSE TEST STRIPS) STRP Check sugar up to 2 times daily. May substitute to any manufacturer covered by patient's insurance. E11.9 100 strip 3   Lancet Device MISC Check sugar up to 2 times daily. May substitute to any manufacturer covered by patient's insurance. E11.9 1 each 0   Lancets Misc. MISC Check sugar up to  2 times daily. May substitute to any manufacturer covered by patient's insurance.E11.9 100 each 3   lidocaine (ASPERCREME LIDOCAINE) 4 % Place 1 patch onto the skin daily.     methocarbamol (ROBAXIN) 750 MG tablet TAKE 1 TABLET BY MOUTH EVERY 8 HOURS AS NEEDED FOR MUSCLE SPASMS 90 tablet 0   metoprolol tartrate (LOPRESSOR) 50 MG tablet TAKE 1 Tablet  BY MOUTH TWICE DAILY FOR HEART 180 tablet 3   nitrofurantoin, macrocrystal-monohydrate, (MACROBID) 100 MG capsule Take 1 capsule (100 mg total) by mouth 2 (two) times daily for 7 days. 14 capsule 0   ondansetron (ZOFRAN-ODT) 4 MG disintegrating tablet Take 1 tablet (4  mg total) by mouth every 8 (eight) hours as needed for nausea or vomiting. 20 tablet 0   pantoprazole (PROTONIX) 40 MG tablet Take 1 tablet (40 mg total) by mouth daily. 90 tablet 3   pregabalin (LYRICA) 50 MG capsule Take 1 capsule (50 mg total) by mouth 2 (two) times daily. Place on file 180 capsule 1   rosuvastatin (CRESTOR) 10 MG tablet Take 1 tablet (10 mg total) by mouth daily. For cholesterol/ fatty liver 90 tablet 3   Semaglutide, 1 MG/DOSE, 4 MG/3ML SOPN Inject 1 mg as directed once a week. 9 mL 3   SUMAtriptan (IMITREX) 50 MG tablet Take 1 tablet (50 mg total) by mouth daily as needed for migraine. May repeat in 4 hours if headache persists or recurs. 6 tablet PRN   metFORMIN (GLUCOPHAGE) 500 MG tablet Take 1 tablet (500 mg total) by mouth daily with breakfast. (Patient not taking: Reported on 11/20/2022) 90 tablet 0   No current facility-administered medications for this visit.   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   Migraine    Palpitations 07/26/2019   Panic attack 06/26/2017   PONV (postoperative nausea and vomiting)    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   Staphylococcus aureus bacteremia with sepsis (HCC)    Wound infection 06/30/2015    Past Surgical History:  Procedure Laterality Date   ABDOMINAL HYSTERECTOMY     BACK SURGERY     X 2   BREAST SURGERY Bilateral    reduction   CHOLECYSTECTOMY     LUMBAR  LAMINECTOMY/DECOMPRESSION MICRODISCECTOMY N/A 06/30/2015   Procedure: wound exploration irrigation and debridement ,explantation of bone growth stimulator, placement of wound vac;  Surgeon: Loura Halt Ditty, MD;  Location: [facility];  Service: Neurosurgery;  Laterality: N/A;    Family History  Problem Relation Age of Onset   Heart disease Mother    Diabetes Mother    Cancer Father        bladder   Autism Brother    Diabetes Brother    Heart disease Brother    Diabetes Brother    Diabetes Brother     Allergies as of 11/20/2022 - Review Complete 11/20/2022  Allergen Reaction Noted   Codeine Itching and Rash 06/30/2015   Latex Itching  and Rash 06/30/2015   Norco [hydrocodone-acetaminophen] Itching and Rash 06/30/2015   Tape Itching and Rash 06/30/2015    Social History   Socioeconomic History   Marital status: Divorced    Spouse name: Not on file   Number of children: 1   Years of education: Not on file   Highest education level: Some college, no degree  Occupational History   Not on file  Tobacco Use   Smoking status: Former    Types: Cigarettes    Quit date: 06/02/2014    Years since quitting: 8.4   Smokeless tobacco: Never  Vaping Use   Vaping Use: Never used  Substance and Sexual Activity   Alcohol use: No   Drug use: Not Currently    Types: Marijuana    Comment: none since a teenager   Sexual activity: Not Currently    Birth control/protection: Surgical  Other Topics Concern   Not on file  Social History Narrative   Not on file   Social Determinants of Health   Financial Resource Strain: Not on file  Food Insecurity: No Food Insecurity (02/28/2022)   Hunger Vital Sign    Worried About Running Out of Food in the Last Year: Never true    Ran Out of Food in the Last Year: Never true  Transportation Needs: No Transportation Needs (02/28/2022)   PRAPARE - Administrator, Civil Service (Medical): No    Lack of Transportation (Non-Medical): No   Physical Activity: Not on file  Stress: Not on file  Social Connections: Not on file  Intimate Partner Violence: Not on file     Review of Systems   Gen: Denies any fever, chills, fatigue, weight loss, lack of appetite.  CV: Denies chest pain, heart palpitations, peripheral edema, syncope.  Resp: Denies shortness of breath at rest or with exertion. Denies wheezing or cough.  GI: see HPI GU : Denies urinary burning, urinary frequency, urinary hesitancy MS: + back pain. Denies muscle weakness, cramps, or limitation of movement.  Derm: Denies rash, itching, dry skin Psych: Denies depression, anxiety, memory loss, and confusion Heme: Denies bruising, bleeding, and enlarged lymph nodes.   Physical Exam   BP 110/68 (BP Location: Right Arm, Patient Position: Sitting, Cuff Size: Large) Comment (Cuff Size): thigh cuff  Pulse 83   Temp (!) 97.5 F (36.4 C) (Oral)   Ht 5\' 7"  (1.702 m)   Wt 287 lb 3.2 oz (130.3 kg)   LMP 02/24/2013   SpO2 95%   BMI 44.98 kg/m   General:   Alert and oriented. Pleasant and cooperative. Well-nourished and well-developed.  Head:  Normocephalic and atraumatic. Eyes:  Without icterus, sclera clear and conjunctiva pink.  Ears:  Normal auditory acuity. Mouth:  No deformity or lesions, oral mucosa pink.  Lungs:  Clear to auscultation bilaterally. No wheezes, rales, or rhonchi. No distress.  Heart:  S1, S2 present without murmurs appreciated.  Abdomen:  +BS, soft, non-tender and non-distended. No HSM noted - difficult given body habitus. No guarding or rebound. No masses appreciated.  Rectal:  Deferred  Msk:  Symmetrical without gross deformities. Normal posture. Extremities:  Without edema. Neurologic:  Alert and  oriented x4;  grossly normal neurologically. Skin:  Intact without significant lesions or rashes. Psych:  Alert and cooperative. Normal mood and affect.  Assessment   TIRSA SABIR is a 47 y.o. female with a history of chronic back pain,  HTN, anxiety, IBS, and newly diagnosed diabetes presenting today for evaluation  of fatty liver and reflux.  Hepatis steatosis on imaging: Family history of fatty liver disease in her brother who ended up developing cirrhosis.  Abdominal ultrasound noting fatty liver, possibly some nodular contour.  May need to perform CT scan to further evaluate her liver after her upper endoscopy and colonoscopy.  Discussed fatty liver diet today.  Recent labs with normal LFTs.  Will plan to recheck these in 6 months.  She is actively working on weight loss.  GERD, nausea: Has been having frequent belching which she describes as foul-smelling, occasional regurgitation of acid with sour taste in her mouth.  Denies any vomiting or dysphagia.  Initially tried over-the-counter omeprazole and Tums without any relief of symptoms.  Has been on pantoprazole 40 mg once daily and still continues to have some intermittent symptoms.  Nausea is likely secondary to Ozempic given after a few days after her shot she has improvement of symptoms.  Today she is due for her next shot and does not have any nausea.  Advised that she needs discussed this with her primary care given she tolerated lower doses of Ozempic without side effects.  We will trial brief increase in PPI to twice daily.  Advised she may use Zofran up to 3 times a day as needed for nausea.  Screening colon cancer: Has never had colon cancer screening.  Denies any family history of colon cancer or colon polyps.  No alarm symptoms at this time.  Will proceed with scheduling first-ever screening colonoscopy.  PLAN   Proceed with upper endoscopy and colonoscopy with propofol by Dr. Marletta Lor in near future: the risks, benefits, and alternatives have been discussed with the patient in detail. The patient states understanding and desires to proceed. ASA 3 (BMI) Hold Ozempic Increase PPI to BID Zofran as needed Recommend 1-2# weight loss per week until ideal body weight through  exercise & diet. Low fat/cholesterol diet.   Avoid sweets, sodas, fruit juices, sweetened beverages like tea, etc. Gradually increase exercise from 15 min daily up to 1 hr per day 5 days/week. Limit alcohol use. HFP in 6 months.  Follow up in 4 months   Brooke Bonito, MSN, FNP-BC, AGACNP-BC [facility]

## 2022-11-20 ENCOUNTER — Encounter: Payer: Self-pay | Admitting: *Deleted

## 2022-11-20 ENCOUNTER — Other Ambulatory Visit: Payer: Self-pay | Admitting: *Deleted

## 2022-11-20 ENCOUNTER — Encounter: Payer: Self-pay | Admitting: Gastroenterology

## 2022-11-20 ENCOUNTER — Telehealth: Payer: Self-pay | Admitting: *Deleted

## 2022-11-20 ENCOUNTER — Ambulatory Visit: Payer: Medicaid Other | Admitting: Gastroenterology

## 2022-11-20 VITALS — BP 110/68 | HR 83 | Temp 97.5°F | Ht 67.0 in | Wt 287.2 lb

## 2022-11-20 DIAGNOSIS — K760 Fatty (change of) liver, not elsewhere classified: Secondary | ICD-10-CM

## 2022-11-20 DIAGNOSIS — K219 Gastro-esophageal reflux disease without esophagitis: Secondary | ICD-10-CM | POA: Diagnosis not present

## 2022-11-20 DIAGNOSIS — Z1211 Encounter for screening for malignant neoplasm of colon: Secondary | ICD-10-CM

## 2022-11-20 MED ORDER — PEG 3350-KCL-NA BICARB-NACL 420 G PO SOLR: 4000.0000 mL | Freq: Once | ORAL | 0 refills | Status: AC

## 2022-11-20 MED ORDER — PANTOPRAZOLE SODIUM 40 MG PO TBEC
40.0000 mg | DELAYED_RELEASE_TABLET | Freq: Two times a day (BID) | ORAL | 3 refills | Status: DC
Start: 2022-11-20 — End: 2023-09-14

## 2022-11-20 NOTE — Patient Instructions (Addendum)
Increase your pantoprazole to twice daily, 30 minutes prior to breakfast and dinner. Can use Zofran up to 3 times daily as needed.   We are scheduling for a colonoscopy and upper endoscopy in the near future with Dr. Marletta Lor. You will need to hold your Ozempic for at least one week.   Instructions for fatty liver: Recommend 1-2# weight loss per week until ideal body weight through exercise & diet. Low fat/cholesterol diet.   Avoid sweets, sodas, fruit juices, sweetened beverages like tea, etc. Gradually increase exercise from 15 min daily up to 1 hr per day 5 days/week. Limit alcohol use.  We will place you on recall to have your liver enzymes rechecked in 6 months.  Congratulations on your weight loss so far!  I would recommend you talking with your PCP regarding maintaining on a lower dose of Ozempic to avoid the chronic nausea.  We will plan to follow up in the office in 4 months to see how your reflux is doing after your have your procedures.   It was a pleasure to see you today. I want to create trusting relationships with patients. If you receive a survey regarding your visit,  I greatly appreciate you taking time to fill this out on paper or through your MyChart. I value your feedback.  Brooke Bonito, MSN, FNP-BC, AGACNP-BC [facility]

## 2022-11-20 NOTE — Telephone Encounter (Signed)
[employer] PA: APPROVED Authorization #: [id number] DOS:Dec 15, 2022 - Mar 15, 2023

## 2022-11-25 ENCOUNTER — Other Ambulatory Visit: Payer: Self-pay | Admitting: Family Medicine

## 2022-11-25 ENCOUNTER — Encounter: Payer: Self-pay | Admitting: Gastroenterology

## 2022-11-25 DIAGNOSIS — Z9889 Other specified postprocedural states: Secondary | ICD-10-CM

## 2022-11-25 DIAGNOSIS — M5416 Radiculopathy, lumbar region: Secondary | ICD-10-CM

## 2022-11-25 DIAGNOSIS — G894 Chronic pain syndrome: Secondary | ICD-10-CM

## 2022-12-05 ENCOUNTER — Encounter: Payer: Self-pay | Admitting: Family

## 2022-12-05 ENCOUNTER — Telehealth (INDEPENDENT_AMBULATORY_CARE_PROVIDER_SITE_OTHER): Payer: Medicaid Other | Admitting: Family

## 2022-12-05 ENCOUNTER — Telehealth: Payer: Self-pay | Admitting: Family Medicine

## 2022-12-05 DIAGNOSIS — R399 Unspecified symptoms and signs involving the genitourinary system: Secondary | ICD-10-CM | POA: Diagnosis not present

## 2022-12-05 DIAGNOSIS — N3000 Acute cystitis without hematuria: Secondary | ICD-10-CM | POA: Diagnosis not present

## 2022-12-05 LAB — URINALYSIS, COMPLETE
Bilirubin, UA: NEGATIVE
Nitrite, UA: POSITIVE — AB
RBC, UA: NEGATIVE
Specific Gravity, UA: >1.03 — ABNORMAL HIGH (ref 1.005–1.030)
Urobilinogen, Ur: 1 mg/dL (ref 0.2–1.0)
pH, UA: 5 (ref 5.0–7.5)

## 2022-12-05 LAB — MICROSCOPIC EXAMINATION
RBC, Urine: NONE SEEN /hpf (ref 0–2)
Renal Epithel, UA: NONE SEEN /hpf

## 2022-12-05 MED ORDER — CEPHALEXIN 500 MG PO CAPS
500.0000 mg | ORAL_CAPSULE | Freq: Two times a day (BID) | ORAL | 0 refills | Status: DC
Start: 2022-12-05 — End: 2023-04-27

## 2022-12-05 NOTE — Telephone Encounter (Signed)
Making appt to be seen

## 2022-12-05 NOTE — Progress Notes (Signed)
Virtual Visit Consent   Cheryl Randall, you are scheduled for a virtual visit with a [facility] provider today. Just as with appointments in the office, your consent must be obtained to participate. Your consent will be active for this visit and any virtual visit you may have with one of our providers in the next 365 days. If you have a MyChart account, a copy of this consent can be sent to you electronically.  As this is a virtual visit, video technology does not allow for your provider to perform a traditional examination. This may limit your provider's ability to fully assess your condition. If your provider identifies any concerns that need to be evaluated in person or the need to arrange testing (such as labs, EKG, etc.), we will make arrangements to do so. Although advances in technology are sophisticated, we cannot ensure that it will always work on either your end or our end. If the connection with a video visit is poor, the visit may have to be switched to a telephone visit. With either a video or telephone visit, we are not always able to ensure that we have a secure connection.  By engaging in this virtual visit, you consent to the provision of healthcare and authorize for your insurance to be billed (if applicable) for the services provided during this visit. Depending on your insurance coverage, you may receive a charge related to this service.  I need to obtain your verbal consent now. Are you willing to proceed with your visit today? Cheryl Randall has provided verbal consent on 12/05/2022 for a virtual visit (video or telephone). Jannifer Rodney, FNP  Date: 12/05/2022 2:18 PM  Virtual Visit via Video Note   I, Jannifer Rodney, connected with  Cheryl Randall  ([id number], [birth year]) on 12/05/22 at  4:45 PM EDT by a video-enabled telemedicine application and verified that I am speaking with the correct person using two identifiers.  Location: Patient: Virtual Visit Location Patient:  Home Provider: Virtual Visit Location Provider: Home Office   I discussed the limitations of evaluation and management by telemedicine and the availability of in person appointments. The patient expressed understanding and agreed to proceed.    History of Present Illness: Cheryl Randall is a 47 y.o. who identifies as a female who was assigned female at birth, and is being seen today for dysuria.  HPI: Dysuria  This is a new problem. The current episode started 1 to 4 weeks ago. The problem occurs every urination. The problem has been unchanged. The quality of the pain is described as burning. The pain is at a severity of 5/10. The pain is mild. Associated symptoms include flank pain, frequency, hesitancy and urgency. Pertinent negatives include no hematuria, nausea or vomiting. She has tried NSAIDs and increased fluids for the symptoms. The treatment provided mild relief.    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   Panic attack 06/26/2017   GAD (generalized anxiety disorder) 06/26/2017   Generalized edema 03/24/2017   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   Chronic pain syndrome 11/25/2016   DDD (degenerative disc disease), lumbosacral 11/24/2016   Deep incisional surgical site  infection 07/01/2015   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    Allergies:  Allergies  Allergen Reactions   Codeine Itching and Rash   Latex Itching and Rash   Norco [Hydrocodone-Acetaminophen] Itching and Rash   Tape Itching and Rash   Medications:  Current Outpatient Medications:    acetaminophen (TYLENOL) 500 MG tablet, Take 500 mg by mouth every 6 (six)  hours as needed., Disp: , Rfl:    acyclovir (ZOVIRAX) 400 MG tablet, Take 1 tablet (400 mg total) by mouth 3 (three) times daily. For 5 days at first sign of cold sores flare, Disp: 30 tablet, Rfl: 0   albuterol (VENTOLIN HFA) 108 (90 Base) MCG/ACT inhaler, Inhale 1-2 puffs into the lungs every 6 (six) hours as needed for wheezing or shortness of breath., Disp: 1 each, Rfl: 0   Blood Glucose Monitoring Suppl DEVI, Check sugars up to 2 times daily. May substitute to any manufacturer covered by patient's insurance. E11.9, Disp: 1 each, Rfl: 0   citalopram (CELEXA) 40 MG tablet, TAKE 1 Tablet BY MOUTH ONCE DAILY FOR MOOD, Disp: 90 tablet, Rfl: 3   Glucose Blood (BLOOD GLUCOSE TEST STRIPS) STRP, Check sugar up to 2 times daily. May substitute to any manufacturer covered by patient's insurance. E11.9, Disp: 100 strip, Rfl: 3   Lancet Device MISC, Check sugar up to 2 times daily. May substitute to any manufacturer covered by patient's insurance. E11.9, Disp: 1 each, Rfl: 0   Lancets Misc. MISC, Check sugar up to 2 times daily. May substitute to any manufacturer covered by patient's insurance.E11.9, Disp: 100 each, Rfl: 3   lidocaine (ASPERCREME LIDOCAINE) 4 %, Place 1 patch onto the skin daily., Disp: , Rfl:    methocarbamol (ROBAXIN) 750 MG tablet, TAKE 1 TABLET BY MOUTH EVERY 8 HOURS AS NEEDED FOR MUSCLE SPASMS, Disp: 90 tablet, Rfl: 1   metoprolol tartrate (LOPRESSOR) 50 MG tablet, TAKE 1 Tablet  BY MOUTH TWICE DAILY FOR HEART, Disp: 180 tablet, Rfl: 3   ondansetron (ZOFRAN-ODT) 4 MG disintegrating tablet, Take 1 tablet (4 mg total) by mouth every 8 (eight) hours as needed for nausea or vomiting., Disp: 20 tablet, Rfl: 0   pantoprazole (PROTONIX) 40 MG tablet, Take 1 tablet (40 mg total) by mouth 2 (two) times daily before a meal., Disp: 90 tablet, Rfl: 3   pregabalin (LYRICA) 50 MG capsule, Take 1 capsule (50 mg total) by mouth 2 (two) times daily. Place on file, Disp: 180 capsule, Rfl: 1   rosuvastatin  (CRESTOR) 10 MG tablet, Take 1 tablet (10 mg total) by mouth daily. For cholesterol/ fatty liver, Disp: 90 tablet, Rfl: 3   Semaglutide, 1 MG/DOSE, 4 MG/3ML SOPN, Inject 1 mg as directed once a week., Disp: 9 mL, Rfl: 3   SUMAtriptan (IMITREX) 50 MG tablet, Take 1 tablet (50 mg total) by mouth daily as needed for migraine. May repeat in 4 hours if headache persists or recurs., Disp: 6 tablet, Rfl: PRN  Observations/Objective: Patient is well-developed, well-nourished in no acute distress.  Resting comfortably  at home.  Head is normocephalic, atraumatic.  No labored breathing.  Speech is clear and coherent with logical content.  Patient is alert and oriented at baseline.    Assessment and Plan: 1. Acute cystitis without hematuria - Urinalysis, Complete - Urine Culture  Start keflex Urine culture pending  Discussed she needs to follow up with PCP for recurrent UTI  AZO as needed   Follow Up Instructions: I discussed the assessment and treatment  plan with the patient. The patient was provided an opportunity to ask questions and all were answered. The patient agreed with the plan and demonstrated an understanding of the instructions.  A copy of instructions were sent to the patient via MyChart unless otherwise noted below.     The patient was advised to call back or seek an in-person evaluation if the symptoms worsen or if the condition fails to improve as anticipated.  Time:  I spent 8 minutes with the patient via telehealth technology discussing the above problems/concerns.    Jannifer Rodney, FNP

## 2022-12-07 LAB — URINE CULTURE

## 2022-12-09 LAB — URINE CULTURE

## 2022-12-09 NOTE — Patient Instructions (Signed)
Cheryl Randall  12/09/2022     @PREFPERIOPPHARMACY @   Your procedure is scheduled on  12/15/2022.   Report to [name] at  0830  A.M.   Call this number if you have problems the morning of surgery:  [phone]  If you experience any cold or flu symptoms such as cough, fever, chills, shortness of breath, etc. between now and your scheduled surgery, please notify us at the above number.   Remember:  Follow the diet and prep instructions given to you by the office.      Your last dose of semiglutide should have been on 12/07/2022.      DO NOT take any medications for diabetes the morning of your procedure.     Take these medicines the morning of surgery with A SIP OF WATER     celexa, robaxin(if needed), metoprolol, zofran (if needed), pantoprazole, lyrica imitrex.     Do not wear jewelry, make-up or nail polish, including gel polish,  artificial nails, or any other type of covering on natural nails (fingers and  toes).  Do not wear lotions, powders, or perfumes, or deodorant.  Do not shave 48 hours prior to surgery.  Men may shave face and neck.  Do not bring valuables to the hospital.  [facility] is not responsible for any belongings or valuables.  Contacts, dentures or bridgework may not be worn into surgery.  Leave your suitcase in the car.  After surgery it may be brought to your room.  For patients admitted to the hospital, discharge time will be determined by your treatment team.  Patients discharged the day of surgery will not be allowed to drive home and must have someone with them for 24 hours.    Special instructions:   DO NOT smoke tobacco or vape for 24 hours before your procedure.  Please read over the following fact sheets that you were given. Anesthesia Post-op Instructions and Care and Recovery After Surgery      Upper Endoscopy, Adult, Care After After the procedure, it is common to have a sore throat. It is also common to have: Mild  stomach pain or discomfort. Bloating. Nausea. Follow these instructions at home: The instructions below may help you care for yourself at home. Your health care provider may give you more instructions. If you have questions, ask your health care provider. If you were given a sedative during the procedure, it can affect you for several hours. Do not drive or operate machinery until your health care provider says that it is safe. If you will be going home right after the procedure, plan to have a responsible adult: Take you home from the hospital or clinic. You will not be allowed to drive. Care for you for the time you are told. Follow instructions from your health care provider about what you may eat and drink. Return to your normal activities as told by your health care provider. Ask your health care provider what activities are safe for you. Take over-the-counter and prescription medicines only as told by your health care provider. Contact a health care provider if you: Have a sore throat that lasts longer than one day. Have trouble swallowing. Have a fever. Get help right away if you: Vomit blood or your vomit looks like coffee grounds. Have bloody, black, or tarry stools. Have a very bad sore throat or you cannot swallow. Have difficulty breathing or very bad pain in your chest or  abdomen. These symptoms may be an emergency. Get help right away. Call 911. Do not wait to see if the symptoms will go away. Do not drive yourself to the hospital. Summary After the procedure, it is common to have a sore throat, mild stomach discomfort, bloating, and nausea. If you were given a sedative during the procedure, it can affect you for several hours. Do not drive until your health care provider says that it is safe. Follow instructions from your health care provider about what you may eat and drink. Return to your normal activities as told by your health care provider. This information is not  intended to replace advice given to you by your health care provider. Make sure you discuss any questions you have with your health care provider. Document Revised: 08/28/2021 Document Reviewed: 08/28/2021 Elsevier Patient Education  2024 [employer] Colonoscopy, Adult, Care After The following information offers guidance on how to care for yourself after your procedure. Your health care provider may also give you more specific instructions. If you have problems or questions, contact your health care provider. What can I expect after the procedure? After the procedure, it is common to have: A small amount of blood in your stool for 24 hours after the procedure. Some gas. Mild cramping or bloating of your abdomen. Follow these instructions at home: Eating and drinking  Drink enough fluid to keep your urine pale yellow. Follow instructions from your health care provider about eating or drinking restrictions. Resume your normal diet as told by your health care provider. Avoid heavy or fried foods that are hard to digest. Activity Rest as told by your health care provider. Avoid sitting for a long time without moving. Get up to take short walks every 1-2 hours. This is important to improve blood flow and breathing. Ask for help if you feel weak or unsteady. Return to your normal activities as told by your health care provider. Ask your health care provider what activities are safe for you. Managing cramping and bloating  Try walking around when you have cramps or feel bloated. If directed, apply heat to your abdomen as told by your health care provider. Use the heat source that your health care provider recommends, such as a moist heat pack or a heating pad. Place a towel between your skin and the heat source. Leave the heat on for 20-30 minutes. Remove the heat if your skin turns bright red. This is especially important if you are unable to feel pain, heat, or cold. You have a greater risk  of getting burned. General instructions If you were given a sedative during the procedure, it can affect you for several hours. Do not drive or operate machinery until your health care provider says that it is safe. For the first 24 hours after the procedure: Do not sign important documents. Do not drink alcohol. Do your regular daily activities at a slower pace than normal. Eat soft foods that are easy to digest. Take over-the-counter and prescription medicines only as told by your health care provider. Keep all follow-up visits. This is important. Contact a health care provider if: You have blood in your stool 2-3 days after the procedure. Get help right away if: You have more than a small spotting of blood in your stool. You have large blood clots in your stool. You have swelling of your abdomen. You have nausea or vomiting. You have a fever. You have increasing pain in your abdomen that is not relieved with  medicine. These symptoms may be an emergency. Get help right away. Call 911. Do not wait to see if the symptoms will go away. Do not drive yourself to the hospital. Summary After the procedure, it is common to have a small amount of blood in your stool. You may also have mild cramping and bloating of your abdomen. If you were given a sedative during the procedure, it can affect you for several hours. Do not drive or operate machinery until your health care provider says that it is safe. Get help right away if you have a lot of blood in your stool, nausea or vomiting, a fever, or increased pain in your abdomen. This information is not intended to replace advice given to you by your health care provider. Make sure you discuss any questions you have with your health care provider. Document Revised: 07/01/2022 Document Reviewed: 01/09/2021 Elsevier Patient Education  2024 Else[employer] Inc. Monitored Anesthesia Care, Care After The following information offers guidance on how to care for  yourself after your procedure. Your health care provider may also give you more specific instructions. If you have problems or questions, contact your health care provider. What can I expect after the procedure? After the procedure, it is common to have: Tiredness. Little or no memory about what happened during or after the procedure. Impaired judgment when it comes to making decisions. Nausea or vomiting. Some trouble with balance. Follow these instructions at home: For the time period you were told by your health care provider:  Rest. Do not participate in activities where you could fall or become injured. Do not drive or use machinery. Do not drink alcohol. Do not take sleeping pills or medicines that cause drowsiness. Do not make important decisions or sign legal documents. Do not take care of children on your own. Medicines Take over-the-counter and prescription medicines only as told by your health care provider. If you were prescribed antibiotics, take them as told by your health care provider. Do not stop using the antibiotic even if you start to feel better. Eating and drinking Follow instructions from your health care provider about what you may eat and drink. Drink enough fluid to keep your urine pale yellow. If you vomit: Drink clear fluids slowly and in small amounts as you are able. Clear fluids include water, ice chips, low-calorie sports drinks, and fruit juice that has water added to it (diluted fruit juice). Eat light and bland foods in small amounts as you are able. These foods include bananas, applesauce, rice, lean meats, toast, and crackers. General instructions  Have a responsible adult stay with you for the time you are told. It is important to have someone help care for you until you are awake and alert. If you have sleep apnea, surgery and some medicines can increase your risk for breathing problems. Follow instructions from your health care provider about  wearing your sleep device: When you are sleeping. This includes during daytime naps. While taking prescription pain medicines, sleeping medicines, or medicines that make you drowsy. Do not use any products that contain nicotine or tobacco. These products include cigarettes, chewing tobacco, and vaping devices, such as e-cigarettes. If you need help quitting, ask your health care provider. Contact a health care provider if: You feel nauseous or vomit every time you eat or drink. You feel light-headed. You are still sleepy or having trouble with balance after 24 hours. You get a rash. You have a fever. You have redness or swelling around the  IV site. Get help right away if: You have trouble breathing. You have new confusion after you get home. These symptoms may be an emergency. Get help right away. Call 911. Do not wait to see if the symptoms will go away. Do not drive yourself to the hospital. This information is not intended to replace advice given to you by your health care provider. Make sure you discuss any questions you have with your health care provider. Document Revised: 10/14/2021 Document Reviewed: 10/14/2021 Elsevier Patient Education  2024 [employer].

## 2022-12-11 ENCOUNTER — Encounter (HOSPITAL_COMMUNITY): Payer: Self-pay

## 2022-12-11 ENCOUNTER — Encounter (HOSPITAL_COMMUNITY)
Admission: RE | Admit: 2022-12-11 | Discharge: 2022-12-11 | Disposition: A | Discharge status: Home / Self Care | Payer: Medicaid Other | Source: Ambulatory Visit | Attending: Internal Medicine | Admitting: Internal Medicine

## 2022-12-11 ENCOUNTER — Other Ambulatory Visit: Payer: Self-pay | Admitting: Family

## 2022-12-11 VITALS — BP 110/68 | HR 83 | Temp 97.5°F | Resp 18 | Ht 67.0 in | Wt 287.2 lb

## 2022-12-11 DIAGNOSIS — Z01810 Encounter for preprocedural cardiovascular examination: Secondary | ICD-10-CM | POA: Diagnosis not present

## 2022-12-11 DIAGNOSIS — N390 Urinary tract infection, site not specified: Secondary | ICD-10-CM

## 2022-12-11 DIAGNOSIS — I10 Essential (primary) hypertension: Secondary | ICD-10-CM | POA: Insufficient documentation

## 2022-12-11 HISTORY — DX: Cardiac arrhythmia, unspecified: I49.9

## 2022-12-11 HISTORY — DX: Gastro-esophageal reflux disease without esophagitis: K21.9

## 2022-12-15 ENCOUNTER — Ambulatory Visit (HOSPITAL_COMMUNITY): Payer: Medicaid Other | Admitting: Anesthesiology

## 2022-12-15 ENCOUNTER — Encounter (HOSPITAL_COMMUNITY)
Admission: RE | Disposition: A | Discharge status: Home / Self Care | Payer: Self-pay | Source: Home / Self Care | Attending: Internal Medicine

## 2022-12-15 ENCOUNTER — Ambulatory Visit (HOSPITAL_COMMUNITY)
Admission: RE | Admit: 2022-12-15 | Discharge: 2022-12-15 | Disposition: A | Discharge status: Home / Self Care | Payer: Medicaid Other | Attending: Internal Medicine | Admitting: Internal Medicine

## 2022-12-15 ENCOUNTER — Encounter (HOSPITAL_COMMUNITY): Payer: Self-pay

## 2022-12-15 DIAGNOSIS — Z87891 Personal history of nicotine dependence: Secondary | ICD-10-CM | POA: Insufficient documentation

## 2022-12-15 DIAGNOSIS — K648 Other hemorrhoids: Secondary | ICD-10-CM | POA: Diagnosis not present

## 2022-12-15 DIAGNOSIS — K2970 Gastritis, unspecified, without bleeding: Secondary | ICD-10-CM

## 2022-12-15 DIAGNOSIS — K760 Fatty (change of) liver, not elsewhere classified: Secondary | ICD-10-CM | POA: Diagnosis present

## 2022-12-15 DIAGNOSIS — Z7985 Long-term (current) use of injectable non-insulin antidiabetic drugs: Secondary | ICD-10-CM | POA: Diagnosis not present

## 2022-12-15 DIAGNOSIS — Z1211 Encounter for screening for malignant neoplasm of colon: Secondary | ICD-10-CM | POA: Diagnosis not present

## 2022-12-15 DIAGNOSIS — K2950 Unspecified chronic gastritis without bleeding: Secondary | ICD-10-CM

## 2022-12-15 DIAGNOSIS — R110 Nausea: Secondary | ICD-10-CM

## 2022-12-15 DIAGNOSIS — K219 Gastro-esophageal reflux disease without esophagitis: Secondary | ICD-10-CM | POA: Diagnosis not present

## 2022-12-15 DIAGNOSIS — F411 Generalized anxiety disorder: Secondary | ICD-10-CM | POA: Diagnosis not present

## 2022-12-15 DIAGNOSIS — K580 Irritable bowel syndrome with diarrhea: Secondary | ICD-10-CM | POA: Diagnosis not present

## 2022-12-15 DIAGNOSIS — Z79899 Other long term (current) drug therapy: Secondary | ICD-10-CM | POA: Insufficient documentation

## 2022-12-15 DIAGNOSIS — K319 Disease of stomach and duodenum, unspecified: Secondary | ICD-10-CM | POA: Insufficient documentation

## 2022-12-15 DIAGNOSIS — G894 Chronic pain syndrome: Secondary | ICD-10-CM | POA: Insufficient documentation

## 2022-12-15 DIAGNOSIS — Z8379 Family history of other diseases of the digestive system: Secondary | ICD-10-CM | POA: Diagnosis not present

## 2022-12-15 DIAGNOSIS — E119 Type 2 diabetes mellitus without complications: Secondary | ICD-10-CM | POA: Diagnosis not present

## 2022-12-15 DIAGNOSIS — Z9049 Acquired absence of other specified parts of digestive tract: Secondary | ICD-10-CM | POA: Diagnosis not present

## 2022-12-15 DIAGNOSIS — I10 Essential (primary) hypertension: Secondary | ICD-10-CM | POA: Diagnosis not present

## 2022-12-15 HISTORY — PX: COLONOSCOPY WITH PROPOFOL: SHX5780

## 2022-12-15 HISTORY — PX: BIOPSY: SHX5522

## 2022-12-15 HISTORY — PX: ESOPHAGOGASTRODUODENOSCOPY (EGD) WITH PROPOFOL: SHX5813

## 2022-12-15 LAB — GLUCOSE, CAPILLARY: Glucose-Capillary: 83 mg/dL (ref 70–99)

## 2022-12-15 SURGERY — COLONOSCOPY WITH PROPOFOL
Anesthesia: General

## 2022-12-15 MED ORDER — LACTATED RINGERS IV SOLN: INTRAVENOUS | Status: DC

## 2022-12-15 MED ORDER — PROPOFOL 10 MG/ML IV BOLUS
INTRAVENOUS | Status: DC | PRN
Start: 2022-12-15 — End: 2022-12-15
  Administered 2022-12-15: 60 mg via INTRAVENOUS
  Administered 2022-12-15: 20 mg via INTRAVENOUS

## 2022-12-15 MED ORDER — PHENYLEPHRINE HCL (PRESSORS) 10 MG/ML IV SOLN
INTRAVENOUS | Status: DC | PRN
  Administered 2022-12-15: 80 ug via INTRAVENOUS
  Administered 2022-12-15: 160 ug via INTRAVENOUS

## 2022-12-15 MED ORDER — GLYCOPYRROLATE PF 0.2 MG/ML IJ SOSY
PREFILLED_SYRINGE | INTRAMUSCULAR | Status: DC | PRN
  Administered 2022-12-15: .2 mg via INTRAVENOUS

## 2022-12-15 MED ORDER — LIDOCAINE HCL (CARDIAC) PF 100 MG/5ML IV SOSY
PREFILLED_SYRINGE | INTRAVENOUS | Status: DC | PRN
  Administered 2022-12-15: 100 mg via INTRATRACHEAL

## 2022-12-15 MED ORDER — PROPOFOL 500 MG/50ML IV EMUL
INTRAVENOUS | Status: DC | PRN
  Administered 2022-12-15: 150 ug/kg/min via INTRAVENOUS

## 2022-12-15 NOTE — Interval H&P Note (Signed)
History and Physical Interval Note:  12/15/2022 10:52 AM  Cheryl Randall  has presented today for surgery, with the diagnosis of screening colon cancer,GERD,nausea.  The various methods of treatment have been discussed with the patient and family. After consideration of risks, benefits and other options for treatment, the patient has consented to  Procedure(s) with comments: COLONOSCOPY WITH PROPOFOL (N/A) - 10:30 am, asa 3 ESOPHAGOGASTRODUODENOSCOPY (EGD) WITH PROPOFOL (N/A) as a surgical intervention.  The patient's history has been reviewed, patient examined, no change in status, stable for surgery.  I have reviewed the patient's chart and labs.  Questions were answered to the patient's satisfaction.     Lanelle Bal

## 2022-12-15 NOTE — Op Note (Signed)
[facility] Patient Name: Cheryl Randall Procedure Date: 12/15/2022 11:14 AM MRN: [id number] Date of Birth: 09/09/1975 Attending MD: Hennie Duos. Marletta Lor , [place], [id number] CSN: [id number] Age: 47 Admit Type: Outpatient Procedure:                Colonoscopy Indications:              Screening for colorectal malignant neoplasm Providers:                Hennie Duos. Marletta Lor, DO, Angelica Ran, Kristine L.                            Jessee Avers, Technician Referring MD:              Medicines:                See the Anesthesia note for documentation of the                            administered medications Complications:            No immediate complications. Estimated Blood Loss:     Estimated blood loss: none. Procedure:                Pre-Anesthesia Assessment:                           - The anesthesia plan was to use monitored                            anesthesia care (MAC).                           After obtaining informed consent, the colonoscope                            was passed under direct vision. Throughout the                            procedure, the patient's blood pressure, pulse, and                            oxygen saturations were monitored continuously. The                            [id number]-[id number] ([id number]) scope was introduced through                            the anus and advanced to the the terminal ileum,                            with identification of the appendiceal orifice and                            IC valve. The colonoscopy was performed without                            difficulty. The patient tolerated  the procedure                            well. The quality of the bowel preparation was                            evaluated using the BBPS [facility] Bowel Preparation                            Scale) with scores of: Right Colon = 3, Transverse                            Colon = 3 and Left Colon = 3 (entire mucosa seen                            well with  no residual staining, small fragments of                            stool or opaque liquid). The total BBPS score                            equals 9. Scope In: 11:18:14 AM Scope Out: 11:26:02 AM Scope Withdrawal Time: 0 hours 5 minutes 55 seconds  Total Procedure Duration: 0 hours 7 minutes 48 seconds  Findings:      Non-bleeding internal hemorrhoids were found during endoscopy.      The terminal ileum appeared normal.      The exam was otherwise without abnormality. Impression:               - Non-bleeding internal hemorrhoids.                           - The examined portion of the ileum was normal.                           - The examination was otherwise normal.                           - No specimens collected. Moderate Sedation:      Per Anesthesia Care Recommendation:           - Patient has a contact number available for                            emergencies. The signs and symptoms of potential                            delayed complications were discussed with the                            patient. Return to normal activities tomorrow.                            Written discharge instructions were provided to the  patient.                           - Resume previous diet.                           - Continue present medications.                           - Repeat colonoscopy in 10 years for screening                            purposes.                           - Return to GI clinic in 3 months. Procedure Code(s):        --- Professional ---                           N8295, Colorectal cancer screening; colonoscopy on                            individual not meeting criteria for high risk Diagnosis Code(s):        --- Professional ---                           Z12.11, Encounter for screening for malignant                            neoplasm of colon                           K64.8, Other hemorrhoids CPT copyright 2022 American Medical  Association. All rights reserved. The codes documented in this report are preliminary and upon coder review may  be revised to meet current compliance requirements. Hennie Duos. Marletta Lor, DO Hennie Duos. Marletta Lor, DO 12/15/2022 11:29:27 AM This report has been signed electronically. Number of Addenda: 0

## 2022-12-15 NOTE — Op Note (Signed)
[facility] Patient Name: Cheryl Randall Procedure Date: 12/15/2022 10:55 AM MRN: [id number] Date of Birth: 08-16-1975 Attending MD: Hennie Duos. Marletta Lor , [place], [id number] CSN: [id number] Age: 47 Admit Type: Outpatient Procedure:                Upper GI endoscopy Indications:              Epigastric abdominal pain, Heartburn, Nausea Providers:                Hennie Duos. Marletta Lor, DO, Angelica Ran, Kristine L.                            Jessee Avers, Technician Referring MD:              Medicines:                See the Anesthesia note for documentation of the                            administered medications Complications:            No immediate complications. Estimated Blood Loss:     Estimated blood loss was minimal. Procedure:                Pre-Anesthesia Assessment:                           - The anesthesia plan was to use monitored                            anesthesia care (MAC).                           After obtaining informed consent, the endoscope was                            passed under direct vision. Throughout the                            procedure, the patient's blood pressure, pulse, and                            oxygen saturations were monitored continuously. The                            [id number]-[id number] ([id number]) scope was introduced through the                            mouth, and advanced to the second part of duodenum.                            The upper GI endoscopy was accomplished without                            difficulty. The patient tolerated the procedure                            well.  Scope In: 11:09:39 AM Scope Out: 11:12:15 AM Total Procedure Duration: 0 hours 2 minutes 36 seconds  Findings:      The Z-line was regular and was found 40 cm from the incisors.      Patchy mild inflammation characterized by erythema was found in the       entire examined stomach. Biopsies were taken with a cold forceps for       Helicobacter pylori testing.       The duodenal bulb, first portion of the duodenum and second portion of       the duodenum were normal. Impression:               - Z-line regular, 40 cm from the incisors.                           - Gastritis. Biopsied.                           - Normal duodenal bulb, first portion of the                            duodenum and second portion of the duodenum. Moderate Sedation:      Per Anesthesia Care Recommendation:           - Patient has a contact number available for                            emergencies. The signs and symptoms of potential                            delayed complications were discussed with the                            patient. Return to normal activities tomorrow.                            Written discharge instructions were provided to the                            patient.                           - Resume previous diet.                           - Continue present medications.                           - Await pathology results.                           - Use a proton pump inhibitor PO BID.                           - No ibuprofen, naproxen, or other non-steroidal  anti-inflammatory drugs.                           - Return to GI clinic in 3 months. Procedure Code(s):        --- Professional ---                           [phone], Esophagogastroduodenoscopy, flexible,                            transoral; with biopsy, single or multiple Diagnosis Code(s):        --- Professional ---                           K29.70, Gastritis, unspecified, without bleeding                           R10.13, Epigastric pain                           R12, Heartburn                           R11.0, Nausea CPT copyright 2022 American Medical Association. All rights reserved. The codes documented in this report are preliminary and upon coder review may  be revised to meet current compliance requirements. Hennie Duos. Marletta Lor, DO Hennie Duos. Marletta Lor,  DO 12/15/2022 11:15:19 AM This report has been signed electronically. Number of Addenda: 0

## 2022-12-15 NOTE — Anesthesia Postprocedure Evaluation (Signed)
Anesthesia Post Note  Patient: Cheryl Randall  Procedure(s) Performed: COLONOSCOPY WITH PROPOFOL ESOPHAGOGASTRODUODENOSCOPY (EGD) WITH PROPOFOL BIOPSY  Patient location during evaluation: Phase II Anesthesia Type: General Level of consciousness: awake and alert and oriented Pain management: pain level controlled Vital Signs Assessment: post-procedure vital signs reviewed and stable Respiratory status: spontaneous breathing, nonlabored ventilation and respiratory function stable Cardiovascular status: blood pressure returned to baseline and stable Postop Assessment: no apparent nausea or vomiting Anesthetic complications: no  No notable events documented.   Last Vitals:  Vitals:   12/15/22 0932 12/15/22 1132  BP: 119/76 (!) 79/52  Pulse: 67 82  Resp: 18 14  Temp: 36.4 C (!) 36.4 C  SpO2: 100% 97%    Last Pain:  Vitals:   12/15/22 1132  TempSrc: Axillary  PainSc: Asleep                 Avyn Aden C Lalo Tromp

## 2022-12-15 NOTE — Anesthesia Preprocedure Evaluation (Signed)
Anesthesia Evaluation  Patient identified by MRN, date of birth, ID band Patient awake    Reviewed: Allergy & Precautions, H&P , NPO status , Patient's Chart, lab work & pertinent test results, reviewed documented beta blocker date and time   History of Anesthesia Complications (+) PONV, Family history of anesthesia reaction and history of anesthetic complications  Airway Mallampati: II  TM Distance: >3 FB Neck ROM: Full    Dental no notable dental hx. (+) Dental Advisory Given   Pulmonary neg pulmonary ROS, former smoker   Pulmonary exam normal breath sounds clear to auscultation       Cardiovascular Exercise Tolerance: Good hypertension, Pt. on medications and Pt. on home beta blockers Normal cardiovascular exam+ dysrhythmias  Rhythm:Regular Rate:Normal  Wendall Stade, MD 09/28/2019  8:05 AM EDT   Normal exercise tolerance test although she didn't go very far.    11-Dec-2022 08:10:43 [facility]-AP-300 ROUTINE RECORD 1975/06/23 (46 yr) Female Caucasian Vent. rate 80 BPM PR interval 176 ms QRS duration 64 ms QT/QTcB 376/433 ms P-R-T axes 44 86 24 Normal sinus rhythm Low voltage QRS Borderline ECG When compared with ECG of 10-Aug-2021 13:14, No significant change was found Confirmed by Julien Nordmann [phone]) on 12/11/2022 4:56:51 PM   Neuro/Psych  Headaches PSYCHIATRIC DISORDERS Anxiety        GI/Hepatic Neg liver ROS,GERD  Medicated and Controlled,,  Endo/Other  diabetes (last dose GLP1 drugs- 10 days ago), Well Controlled, Type 2, Oral Hypoglycemic Agents  Morbid obesity  Renal/GU negative Renal ROS  negative genitourinary   Musculoskeletal  (+) Arthritis , Osteoarthritis,    Abdominal   Peds negative pediatric ROS (+)  Hematology negative hematology ROS (+)   Anesthesia Other Findings   Reproductive/Obstetrics negative OB ROS                              Anesthesia Physical Anesthesia Plan  ASA: 3  Anesthesia Plan: General   Post-op Pain Management: Minimal or no pain anticipated   Induction: Intravenous  PONV Risk Score and Plan: 1 and Propofol infusion  Airway Management Planned: Nasal Cannula and Natural Airway  Additional Equipment:   Intra-op Plan:   Post-operative Plan:   Informed Consent: I have reviewed the patients History and Physical, chart, labs and discussed the procedure including the risks, benefits and alternatives for the proposed anesthesia with the patient or authorized representative who has indicated his/her understanding and acceptance.     Dental advisory given  Plan Discussed with: CRNA and Surgeon  Anesthesia Plan Comments:         Anesthesia Quick Evaluation

## 2022-12-15 NOTE — Transfer of Care (Signed)
Immediate Anesthesia Transfer of Care Note  Patient: Cheryl Randall  Procedure(s) Performed: COLONOSCOPY WITH PROPOFOL ESOPHAGOGASTRODUODENOSCOPY (EGD) WITH PROPOFOL BIOPSY  Patient Location: PACU  Anesthesia Type:General  Level of Consciousness: sedated  Airway & Oxygen Therapy: Patient Spontanous Breathing  Post-op Assessment: Report given to RN, Post -op Vital signs reviewed and stable, and Patient moving all extremities X 4  Post vital signs: Reviewed and stable  Last Vitals:  Vitals Value Taken Time  BP 79/52 12/15/22 1132  Temp 36.4 C 12/15/22 1132  Pulse 82 12/15/22 1132  Resp 14 12/15/22 1132  SpO2 97 % 12/15/22 1132    Last Pain:  Vitals:   12/15/22 1132  TempSrc: Axillary  PainSc: Asleep         Complications: No notable events documented.

## 2022-12-15 NOTE — Discharge Instructions (Addendum)
EGD Discharge instructions Please read the instructions outlined below and refer to this sheet in the next few weeks. These discharge instructions provide you with general information on caring for yourself after you leave the hospital. Your doctor may also give you specific instructions. While your treatment has been planned according to the most current medical practices available, unavoidable complications occasionally occur. If you have any problems or questions after discharge, please call your doctor. ACTIVITY You may resume your regular activity but move at a slower pace for the next 24 hours.  Take frequent rest periods for the next 24 hours.  Walking will help expel (get rid of) the air and reduce the bloated feeling in your abdomen.  No driving for 24 hours (because of the anesthesia (medicine) used during the test).  You may shower.  Do not sign any important legal documents or operate any machinery for 24 hours (because of the anesthesia used during the test).  NUTRITION Drink plenty of fluids.  You may resume your normal diet.  Begin with a light meal and progress to your normal diet.  Avoid alcoholic beverages for 24 hours or as instructed by your caregiver.  MEDICATIONS You may resume your normal medications unless your caregiver tells you otherwise.  WHAT YOU CAN EXPECT TODAY You may experience abdominal discomfort such as a feeling of fullness or "gas" pains.  FOLLOW-UP Your doctor will discuss the results of your test with you.  SEEK IMMEDIATE MEDICAL ATTENTION IF ANY OF THE FOLLOWING OCCUR: Excessive nausea (feeling sick to your stomach) and/or vomiting.  Severe abdominal pain and distention (swelling).  Trouble swallowing.  Temperature over 101 F (37.8 C).  Rectal bleeding or vomiting of blood.      Colonoscopy Discharge Instructions  Read the instructions outlined below and refer to this sheet in the next few weeks. These discharge instructions provide you  with general information on caring for yourself after you leave the hospital. Your doctor may also give you specific instructions. While your treatment has been planned according to the most current medical practices available, unavoidable complications occasionally occur.   ACTIVITY You may resume your regular activity, but move at a slower pace for the next 24 hours.  Take frequent rest periods for the next 24 hours.  Walking will help get rid of the air and reduce the bloated feeling in your belly (abdomen).  No driving for 24 hours (because of the medicine (anesthesia) used during the test).   Do not sign any important legal documents or operate any machinery for 24 hours (because of the anesthesia used during the test).  NUTRITION Drink plenty of fluids.  You may resume your normal diet as instructed by your doctor.  Begin with a light meal and progress to your normal diet. Heavy or fried foods are harder to digest and may make you feel sick to your stomach (nauseated).  Avoid alcoholic beverages for 24 hours or as instructed.  MEDICATIONS You may resume your normal medications unless your doctor tells you otherwise.  WHAT YOU CAN EXPECT TODAY Some feelings of bloating in the abdomen.  Passage of more gas than usual.  Spotting of blood in your stool or on the toilet paper.  IF YOU HAD POLYPS REMOVED DURING THE COLONOSCOPY: No aspirin products for 7 days or as instructed.  No alcohol for 7 days or as instructed.  Eat a soft diet for the next 24 hours.  FINDING OUT THE RESULTS OF YOUR TEST Not all test results  are available during your visit. If your test results are not back during the visit, make an appointment with your caregiver to find out the results. Do not assume everything is normal if you have not heard from your caregiver or the medical facility. It is important for you to follow up on all of your test results.  SEEK IMMEDIATE MEDICAL ATTENTION IF: You have more than a  spotting of blood in your stool.  Your belly is swollen (abdominal distention).  You are nauseated or vomiting.  You have a temperature over 101.  You have abdominal pain or discomfort that is severe or gets worse throughout the day.   Your EGD revealed mild amount inflammation in your stomach.  I took biopsies of this to rule out infection with a bacteria called H. pylori.  Await pathology results, my office will contact you.  Esophagus and small bowel appeared normal  Continue on pantoprazole 40 mg twice daily.   Your colonoscopy was relatively unremarkable.  I did not find any polyps or evidence of colon cancer.  I recommend repeating colonoscopy in 10 years for colon cancer screening purposes.     Follow-up with GI in 3 months    I hope you have a great rest of your week!  Hennie Duos. Marletta Lor, D.O. Gastroenterology and Hepatology [facility]

## 2022-12-18 LAB — SURGICAL PATHOLOGY

## 2022-12-19 ENCOUNTER — Other Ambulatory Visit: Payer: Self-pay | Admitting: Pharmacist

## 2022-12-19 DIAGNOSIS — E1169 Type 2 diabetes mellitus with other specified complication: Secondary | ICD-10-CM

## 2022-12-19 NOTE — Progress Notes (Unsigned)
12/19/2022 Name: Cheryl Randall MRN: [id number] DOB: Oct 06, 1975  No chief complaint on file.  Discussed patient progress and she is interested in potentially switching form Ozempic to [facility] Would like additional weight loss efforts/appetite suppression Patient stable and reports 40lb+ weight loss since starting a GLP  {Visit Type:26650}   Subjective:  Care Team: Primary Care Provider: Raliegh Ip, DO ; Next Scheduled Visit: *** {careteamprovider:27366}  Medication Access/Adherence  Current Pharmacy:  [facility], [place] - [address] [place] [address] Phone: [phone] Fax: [phone]  [facility], [place] - [address], [place] [address] [place] [address] Phone: [phone] Fax: [phone]   Patient reports affordability concerns with their medications: {YES/NO:21197} Patient reports access/transportation concerns to their pharmacy: {YES/NO:21197} Patient reports adherence concerns with their medications:  {YES/NO:21197} ***   {Pharmacy S/O Choices:26420}   Objective:  Lab Results  Component Value Date   HGBA1C 5.6 11/07/2022    Lab Results  Component Value Date   CREATININE 0.82 10/31/2022   BUN 10 10/31/2022   NA 135 10/31/2022   K 4.0 10/31/2022   CL 103 10/31/2022   CO2 24 10/31/2022    Lab Results  Component Value Date   CHOL 156 11/18/2021   HDL 50 11/18/2021   LDLCALC 85 11/18/2021   TRIG 103 11/18/2021   CHOLHDL 3.1 11/18/2021    Medications Reviewed Today     Reviewed by Carron Brazen, RN (Registered Nurse) on 12/15/22 at 1029  Med List Status: Complete   Medication Order Taking? Sig Documenting Provider Last Dose Status Informant  acyclovir (ZOVIRAX) 400 MG tablet [id number] No Take 1 tablet (400 mg total) by mouth 3 (three) times daily. For 5 days at first sign of cold sores flare Delynn Flavin M, DO More than a month  Active Self  albuterol (VENTOLIN HFA) 108 (90 Base) MCG/ACT inhaler [id number] Yes Inhale 1-2 puffs into the lungs every 6 (six) hours as needed for wheezing or shortness of breath. Junie Spencer, FNP Past Week Active Self  Blood Glucose Monitoring Suppl DEVI [id number] Yes Check sugars up to 2 times daily. May substitute to any manufacturer covered by patient's insurance. E11.9 Raliegh Ip, DO 12/14/2022 Active Self  cephALEXin (KEFLEX) 500 MG capsule [id number] Yes Take 1 capsule (500 mg total) by mouth 2 (two) times daily.  Patient taking differently: Take 1,000 mg by mouth daily.   Junie Spencer, FNP 12/14/2022 Active Self  citalopram (CELEXA) 40 MG tablet [id number] Yes TAKE 1 Tablet BY MOUTH ONCE DAILY FOR MOOD Raliegh Ip, DO 12/14/2022 2200 Active Self  Glucose Blood (BLOOD GLUCOSE TEST STRIPS) STRP [id number] Yes Check sugar up to 2 times daily. May substitute to any manufacturer covered by patient's insurance. E11.9 Raliegh Ip, DO 12/14/2022 Active Self  Lancet Device MISC [id number] Yes Check sugar up to 2 times daily. May substitute to any manufacturer covered by patient's insurance. E11.9 Raliegh Ip, DO 12/14/2022 Active Self  Lancets Misc. MISC [id number]  Check sugar up to 2 times daily. May substitute to any manufacturer covered by patient's insurance.E11.9 Delynn Flavin M, DO  Active Self  lidocaine (ASPERCREME LIDOCAINE) 4 % [id number] Yes Place 1 patch onto the skin daily as needed (pain). [provider] 12/14/2022 Active Self  methocarbamol (ROBAXIN) 750 MG tablet [id number] Yes TAKE 1 TABLET BY MOUTH EVERY 8 HOURS AS NEEDED FOR MUSCLE SPASMS  Delynn Flavin M, DO Past Week Active Self  metoprolol tartrate (LOPRESSOR) 50 MG tablet [id number] Yes TAKE 1 Tablet  BY MOUTH TWICE DAILY FOR HEART Delynn Flavin M, DO 12/14/2022 2200 Active Self  ondansetron (ZOFRAN-ODT) 4 MG disintegrating tablet [id number] Yes Take 1 tablet (4 mg total) by mouth  every 8 (eight) hours as needed for nausea or vomiting. Junie Spencer, FNP Past Week Active Self  pantoprazole (PROTONIX) 40 MG tablet [id number] Yes Take 1 tablet (40 mg total) by mouth 2 (two) times daily before a meal. Aida Raider, NP 12/14/2022 2200 Active Self  pregabalin (LYRICA) 50 MG capsule [id number] Yes Take 1 capsule (50 mg total) by mouth 2 (two) times daily. Place on file  Patient taking differently: Take 50 mg by mouth 2 (two) times daily as needed (pain). Place on file   Delynn Flavin M, DO Past Week Active Self  rosuvastatin (CRESTOR) 10 MG tablet [id number] Yes Take 1 tablet (10 mg total) by mouth daily. For cholesterol/ fatty liver Delynn Flavin M, DO Past Week Active Self  Semaglutide, 1 MG/DOSE, 4 MG/3ML SOPN [id number] Yes Inject 1 mg as directed once a week. Delynn Flavin M, DO Past Week Active Self  SUMAtriptan (IMITREX) 50 MG tablet [id number] Yes Take 1 tablet (50 mg total) by mouth daily as needed for migraine. May repeat in 4 hours if headache persists or recurs. Raliegh Ip, DO Past Week Active Self              Assessment/Plan:   {Pharmacy A/P Choices:26421}  Follow Up Plan: [id number] placed; TBD  Kieth Brightly, PharmD, BCACP Clinical Pharmacist, [facility]

## 2022-12-22 ENCOUNTER — Encounter (HOSPITAL_COMMUNITY): Payer: Self-pay | Admitting: Internal Medicine

## 2022-12-29 ENCOUNTER — Other Ambulatory Visit (INDEPENDENT_AMBULATORY_CARE_PROVIDER_SITE_OTHER): Payer: Medicaid Other | Admitting: Pharmacist

## 2022-12-29 DIAGNOSIS — Z7985 Long-term (current) use of injectable non-insulin antidiabetic drugs: Secondary | ICD-10-CM | POA: Diagnosis not present

## 2022-12-29 DIAGNOSIS — E119 Type 2 diabetes mellitus without complications: Secondary | ICD-10-CM

## 2022-12-29 MED ORDER — TIRZEPATIDE 7.5 MG/0.5ML ~~LOC~~ SOAJ
7.5000 mg | SUBCUTANEOUS | 1 refills | Status: DC
Start: 2022-12-29 — End: 2023-01-29

## 2022-12-29 NOTE — Progress Notes (Signed)
   12/29/2022 Name: Cheryl Randall MRN: [id number] DOB: May 09, 1976  Chief Complaint  Patient presents with   Diabetes    Cheryl Randall is a 47 y.o. year old female who was referred for medication management by their primary care provider, Delynn Flavin M, DO. They presented for a face to face visit today.  Patient is doing well on Ozempic for her type 2 diabetes, however she I interested in [employer] to boost her progress and potentially have less side effects.  Patient denies personal or family history of medullary thyroid carcinoma (MTC) or in patients with Multiple Endocrine Neoplasia syndrome type 2 (MEN 2)   They were referred to the pharmacist by their PCP for assistance in managing diabetes    Subjective:  Care Team: Primary Care Provider: Raliegh Ip, DO   Medication Access/Adherence  Current Pharmacy:  [facility] [place] - [address] [place] [address] Phone: [phone] Fax: [phone]  [facility] [place], [place] - [address], [place] [address] [place] [address] Phone: [phone] Fax: [phone]   Diabetes:  Current medications: Ozempic 1mg  Medications tried in the past: metformin  Current glucose readings: FBG<130  Patient denies hypoglycemic s/sx including dizziness, shakiness, sweating. Patient denies hyperglycemic symptoms including polyuria, polydipsia, polyphagia, nocturia, neuropathy, blurred vision.  Current meal patterns:  The patient is asked to make an attempt to improve diet and exercise patterns to aid in medical management of this problem. Discussed meal planning options and Plate method for healthy eating Avoid sugary drinks and desserts Incorporate balanced protein, non starchy veggies, 1 serving of carbohydrate with each meal Increase water intake Increase physical activity as able  Current physical activity: encouraged   Current  medication access support: medicaid   Objective:  Lab Results  Component Value Date   HGBA1C 5.6 11/07/2022    Lab Results  Component Value Date   CREATININE 0.82 10/31/2022   BUN 10 10/31/2022   NA 135 10/31/2022   K 4.0 10/31/2022   CL 103 10/31/2022   CO2 24 10/31/2022    Lab Results  Component Value Date   CHOL 156 11/18/2021   HDL 50 11/18/2021   LDLCALC 85 11/18/2021   TRIG 103 11/18/2021   CHOLHDL 3.1 11/18/2021    Medications Reviewed Today   Medications were not reviewed in this encounter    Assessment/Plan:   Diabetes: - Currently controlled--patient would like additional weight loss support by transitioning to [facility] - Reviewed long term cardiovascular and renal outcomes of uncontrolled blood sugar - Reviewed goal A1c, goal fasting, and goal 2 hour post prandial glucose - Reviewed dietary modifications including FOLLOWING A HEART HEALTHY DIET/HEALTHY PLATE METHOD - Reviewed lifestyle modifications including: - Recommend to : START Mounjaro 7.5mg  sq weekly; titrate every monthly as tolerated STOP Ozempic patient denies personal or family history of medullary thyroid carcinoma (MTC) or in patients with Multiple Endocrine Neoplasia syndrome type 2 (MEN 2) - Recommend to check glucose daily (fasting) or if symptomatic;l  Follow Up Plan: 2 months with PharmD    Kieth Brightly, PharmD, BCACP Clinical Pharmacist, [facility]

## 2023-01-02 NOTE — Progress Notes (Deleted)
Name: Cheryl Randall DOB: 01-17-1976 MRN: [id number]  History of Present Illness: Cheryl Randall is a 47 y.o. female who presents today as a new patient at [facility] [place]. All available relevant medical records have been reviewed.   She reports chief complaint of recurrent UTls.   ***relapsing UTI  Urine culture results in past 12 months: - 10/16/2022: Positive for E. Coli  - 11/07/2022: Positive for E. Coli  - 12/05/2022: Positive for E. Coli   Recent relevant imaging: - 10/31/2022: Abdominal ultrasound showed a "7 mm echogenic area identified in the upper pole but no shadowing. This could be interface artifact rather than stone."  ***RUS & KUB for stone eval?   Urinary Symptoms: She reports *** UTl's in the last year. When present, UTI symptoms include ***dysuria, ***increased urinary urgency, ***frequency, ***. She reports that UTI symptoms do ***not seem to correlate with intercourse. She {Actions; denies-reports:120008} acute UTI symptoms today.  At baseline: She {Actions; denies-reports:120008} urinary urgency, frequency, dysuria, gross hematuria, hesitancy, straining to void, or sensations of incomplete emptying. She reports voiding *** times per day and *** times at night. She {Actions; denies-reports:120008} pushing on a bulge in order to empty her bladder.  She {Actions; denies-reports:120008} urge incontinence. She {Actions; denies-reports:120008} stress incontinence with ***cough/***laugh/***sneeze/***heavy lifting/***exercise. She {Actions; denies-reports:120008} enuresis. She leaks *** times per ***. Wears *** ***pads / ***diapers per day. She states the ***SUI / ***UUI is predominant. This has been going on for {NUMBERS 1-12:18279} {days/wks/mos/yrs:310907} and {ACTION; IS/IS WUJ:81191478} significantly bothersome. In terms of treatment, She has tried ***.  She {Actions; denies-reports:120008} caffeine intake.  She {Actions; denies-reports:120008}  history of pyelonephritis.  She {Actions; denies-reports:120008} history of kidney stones.  Vaginal / prolapse Symptoms: She {Actions; denies-reports:120008} vaginal bulge sensation.  She {Actions; denies-reports:120008} seeing a vaginal bulge. This bulge is bothersome and is the size of ***. It was first noticed ***.  She {Actions; denies-reports:120008} vaginal pain, bleeding, or discharge.  She {Actions; denies-reports:120008} use of topical vaginal estrogen cream.  Bowel Symptoms: She has a continent bowel movement *** time(s) per ***. Typical Bristol stool scale of continent bowel episodes is Type ***. She {Actions; denies-reports:120008} Fl episodes. Fecal incontinence started *** and occurs *** times per week. Typical Bristol stool scale of incontinent bowel episodes is Type ***. She {Actions; denies-reports:120008} straining and {Actions; denies-reports:120008} splinting to defecate. She {Actions; denies-reports:120008} rectal bleeding. She {Actions; denies-reports:120008} feeling like she fully empties her rectum with defecation. She {Actions; denies-reports:120008} urgency with defecation. She {Actions; denies-reports:120008} difficulty wiping clean. She {Actions; denies-reports:120008} taking laxatives, stool softeners, or fiber supplements. Last colonoscopy was***.  Past OB/GYN History: OB History     Gravida  1   Para  1   Term  1   Preterm      AB      Living  1      SAB      IAB      Ectopic      Multiple      Live Births             She {Actions; denies-reports:120008} being sexually active.  She {Actions; denies-reports:120008} dyspareunia. ***Dyspareunia is located ***at the introitus / ***deep inside the vagina. She has *** child(ren) who were delivered ***vaginally ***via c-section. She {Actions; denies-reports:120008} significant tearing with that ***delivery / those ***deliveries. She is {DESC; PRE/POST:32304} menopausal.  Last pap smear was  ***. She {Actions; denies-reports:120008} history of *** hysterectomy.  Fall Screening: Do you usually have a  device to assist in your mobility? {yes/no:20286} ***cane / ***walker / ***wheelchair   Medications: Current Outpatient Medications  Medication Sig Dispense Refill   acyclovir (ZOVIRAX) 400 MG tablet Take 1 tablet (400 mg total) by mouth 3 (three) times daily. For 5 days at first sign of cold sores flare 30 tablet 0   albuterol (VENTOLIN HFA) 108 (90 Base) MCG/ACT inhaler Inhale 1-2 puffs into the lungs every 6 (six) hours as needed for wheezing or shortness of breath. 1 each 0   Blood Glucose Monitoring Suppl DEVI Check sugars up to 2 times daily. May substitute to any manufacturer covered by patient's insurance. E11.9 1 each 0   cephALEXin (KEFLEX) 500 MG capsule Take 1 capsule (500 mg total) by mouth 2 (two) times daily. (Patient taking differently: Take 1,000 mg by mouth daily.) 14 capsule 0   citalopram (CELEXA) 40 MG tablet TAKE 1 Tablet BY MOUTH ONCE DAILY FOR MOOD 90 tablet 3   Glucose Blood (BLOOD GLUCOSE TEST STRIPS) STRP Check sugar up to 2 times daily. May substitute to any manufacturer covered by patient's insurance. E11.9 100 strip 3   Lancet Device MISC Check sugar up to 2 times daily. May substitute to any manufacturer covered by patient's insurance. E11.9 1 each 0   Lancets Misc. MISC Check sugar up to 2 times daily. May substitute to any manufacturer covered by patient's insurance.E11.9 100 each 3   lidocaine (ASPERCREME LIDOCAINE) 4 % Place 1 patch onto the skin daily as needed (pain).     methocarbamol (ROBAXIN) 750 MG tablet TAKE 1 TABLET BY MOUTH EVERY 8 HOURS AS NEEDED FOR MUSCLE SPASMS 90 tablet 1   metoprolol tartrate (LOPRESSOR) 50 MG tablet TAKE 1 Tablet  BY MOUTH TWICE DAILY FOR HEART 180 tablet 3   ondansetron (ZOFRAN-ODT) 4 MG disintegrating tablet Take 1 tablet (4 mg total) by mouth every 8 (eight) hours as needed for nausea or vomiting. 20 tablet 0    pantoprazole (PROTONIX) 40 MG tablet Take 1 tablet (40 mg total) by mouth 2 (two) times daily before a meal. 90 tablet 3   pregabalin (LYRICA) 50 MG capsule Take 1 capsule (50 mg total) by mouth 2 (two) times daily. Place on file (Patient taking differently: Take 50 mg by mouth 2 (two) times daily as needed (pain). Place on file) 180 capsule 1   rosuvastatin (CRESTOR) 10 MG tablet Take 1 tablet (10 mg total) by mouth daily. For cholesterol/ fatty liver 90 tablet 3   SUMAtriptan (IMITREX) 50 MG tablet Take 1 tablet (50 mg total) by mouth daily as needed for migraine. May repeat in 4 hours if headache persists or recurs. 6 tablet PRN   tirzepatide (MOUNJARO) 7.5 MG/0.5ML Pen Inject 7.5 mg into the skin once a week. DX: E11.65 6 mL 1   No current facility-administered medications for this visit.    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   Deep incisional surgical site infection 07/01/2015   Diabetes mellitus without complication (HCC)    borderline; diet controlled   Dysrhythmia    Essential hypertension 07/26/2019   Family history of adverse reaction to anesthesia    Patients son has  bad N/V   GAD (generalized anxiety disorder) 06/26/2017   Generalized edema 03/24/2017   GERD (gastroesophageal reflux disease)    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   Migraine    Palpitations 07/26/2019   Panic attack 06/26/2017   PONV (postoperative nausea and vomiting)    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   Staphylococcus aureus bacteremia with sepsis (HCC)    Wound infection 06/30/2015   Past Surgical History:   Procedure Laterality Date   ABDOMINAL HYSTERECTOMY     20s   BACK SURGERY     x4   BIOPSY  12/15/2022   Procedure: BIOPSY;  Surgeon: Lanelle Bal, DO;  [facility]: [facility];  Service: Endoscopy;;   BREAST SURGERY Bilateral    reduction   CHOLECYSTECTOMY     COLONOSCOPY WITH PROPOFOL N/A 12/15/2022   Procedure: COLONOSCOPY WITH PROPOFOL;  Surgeon: Lanelle Bal, DO;  Location: [facility];  Service: Endoscopy;  Laterality: N/A;  10:30 am, asa 3   ESOPHAGOGASTRODUODENOSCOPY (EGD) WITH PROPOFOL N/A 12/15/2022   Procedure: ESOPHAGOGASTRODUODENOSCOPY (EGD) WITH PROPOFOL;  Surgeon: Lanelle Bal, DO;  Location: [facility];  Service: Endoscopy;  Laterality: N/A;   LUMBAR LAMINECTOMY/DECOMPRESSION MICRODISCECTOMY N/A 06/30/2015   Procedure: wound exploration irrigation and debridement ,explantation of bone growth stimulator, placement of wound vac;  Surgeon: Loura Halt Ditty, MD;  Location: [facility];  Service: Neurosurgery;  Laterality: N/A;   Family History  Problem Relation Age of Onset   Heart disease Mother    Diabetes Mother    Cancer Father        bladder   Autism Brother    Diabetes Brother    Heart disease Brother    Diabetes Brother    Diabetes Brother    Breast cancer Maternal Aunt        double mastectomy   Social History   Socioeconomic History   Marital status: Divorced    Spouse name: Not on file   Number of children: 1   Years of education: Not on file   Highest education level: Some college, no degree  Occupational History   Not on file  Tobacco Use   Smoking status: Former    Current packs/day: 0.00    Types: Cigarettes    Quit date: 06/02/2014    Years since quitting: 8.5   Smokeless tobacco: Never  Vaping Use   Vaping status: Never Used  Substance and Sexual Activity   Alcohol use: No   Drug use: Not Currently    Types: Marijuana    Comment: none since a teenager   Sexual activity: Not Currently    Birth control/protection:  Surgical  Other Topics Concern   Not on file  Social History Narrative   Not on file   Social Determinants of Health   Financial Resource Strain: Not on file  Food Insecurity: No Food Insecurity (02/28/2022)   Hunger Vital Sign    Worried About Running Out of Food in the Last Year: Never true    Ran Out of Food in the Last Year: Never true  Transportation Needs: No Transportation Needs (02/28/2022)   PRAPARE - Administrator, Civil Service (Medical): No    Lack of Transportation (Non-Medical): No  Physical Activity: Not on file  Stress: Not on file  Social Connections: Not on file  Intimate Partner Violence: Not on file    SUBJECTIVE  Review of Systems Constitutional: Patient ***  denies any unintentional weight loss or change in strength lntegumentary: Patient ***denies any rashes or pruritus Eyes: Patient denies ***dry eyes ENT: Patient ***denies dry mouth Cardiovascular: Patient ***denies chest pain or syncope Respiratory: Patient ***denies shortness of breath Gastrointestinal: Patient ***denies nausea, vomiting, constipation, or diarrhea Musculoskeletal: Patient ***denies muscle cramps or weakness Neurologic: Patient ***denies convulsions or seizures Psychiatric: Patient ***denies memory problems Allergic/Immunologic: Patient ***denies recent allergic reaction(s) Hematologic/Lymphatic: Patient denies bleeding tendencies Endocrine: Patient ***denies heat/cold intolerance  GU: As per HPI.  OBJECTIVE There were no vitals filed for this visit. There is no height or weight on file to calculate BMI.  Physical Examination Constitutional: ***No obvious distress; patient is ***non-toxic appearing  Cardiovascular: ***No visible lower extremity edema.  Respiratory: The patient does ***not have audible wheezing/stridor; respirations do ***not appear labored  Gastrointestinal: Abdomen ***non-distended Musculoskeletal: ***Normal ROM of UEs  Skin: ***No obvious  rashes/open sores  Neurologic: CN 2-12 grossly ***intact Psychiatric: Answered questions ***appropriately with ***normal affect  Hematologic/Lymphatic/Immunologic: ***No obvious bruises or sites of spontaneous bleeding  UA: {Desc; negative/positive:13464} for *** WBC/hpf, *** RBC/hpf, bacteria (***) PVR: *** ml  ASSESSMENT No diagnosis found.  ***Recurrent UTls / ***History of UTls with insufficient urine culture data to formally validate recurrent UTI diagnosis: *** 2. ***Vaginal atrophy: ***  Will plan for follow up in *** months or sooner if needed. Pt verbalized understanding and agreement. All questions were answered.  PLAN Advised the following: 1. *** 2. ***No follow-ups on file.  No orders of the defined types were placed in this encounter.   It has been explained that the patient is to follow regularly with their PCP in addition to all other providers involved in their care and to follow instructions provided by these respective offices. Patient advised to contact urology clinic if any urologic-pertaining questions, concerns, new symptoms or problems arise in the interim period.  There are no Patient Instructions on file for this visit.  Electronically signed by:  Donnita Falls, MSN, FNP-C, CUNP 01/02/2023 12:59 PM

## 2023-01-05 ENCOUNTER — Encounter: Payer: Self-pay | Admitting: Family Medicine

## 2023-01-05 ENCOUNTER — Ambulatory Visit: Payer: Self-pay | Admitting: Urology

## 2023-01-05 ENCOUNTER — Ambulatory Visit: Payer: Medicaid Other | Admitting: Family Medicine

## 2023-01-05 VITALS — BP 119/80 | HR 78 | Temp 98.6°F | Ht 67.0 in | Wt 283.0 lb

## 2023-01-05 DIAGNOSIS — R300 Dysuria: Secondary | ICD-10-CM | POA: Diagnosis not present

## 2023-01-05 DIAGNOSIS — N3000 Acute cystitis without hematuria: Secondary | ICD-10-CM

## 2023-01-05 DIAGNOSIS — N390 Urinary tract infection, site not specified: Secondary | ICD-10-CM

## 2023-01-05 LAB — URINALYSIS, ROUTINE W REFLEX MICROSCOPIC
Bilirubin, UA: NEGATIVE
Glucose, UA: NEGATIVE
Ketones, UA: NEGATIVE
Nitrite, UA: NEGATIVE
RBC, UA: NEGATIVE
Specific Gravity, UA: 1.025 (ref 1.005–1.030)
Urobilinogen, Ur: 0.2 mg/dL (ref 0.2–1.0)
pH, UA: 6.5 (ref 5.0–7.5)

## 2023-01-05 LAB — MICROSCOPIC EXAMINATION
Epithelial Cells (non renal): NONE SEEN /hpf (ref 0–10)
RBC, Urine: NONE SEEN /hpf (ref 0–2)
Renal Epithel, UA: NONE SEEN /hpf
WBC, UA: >30 /hpf — AB (ref 0–5)

## 2023-01-05 MED ORDER — FLUCONAZOLE 150 MG PO TABS
150.0000 mg | ORAL_TABLET | Freq: Once | ORAL | 0 refills | Status: AC
Start: 2023-01-05 — End: 2023-01-05

## 2023-01-05 MED ORDER — NITROFURANTOIN MONOHYD MACRO 100 MG PO CAPS
100.0000 mg | ORAL_CAPSULE | Freq: Two times a day (BID) | ORAL | 0 refills | Status: AC
Start: 2023-01-05 — End: 2023-01-10

## 2023-01-05 NOTE — Progress Notes (Signed)
Subjective: CC: UTI PCP: Raliegh Ip, DO UJW:[id number] Cheryl Randall is a 47 y.o. female presenting to clinic today for:  1.  UTI Patient reports that she has had several day history of urinary frequency, urgency and feeling pain at the end of a stream.  She does feel that she is able to evacuate her bladder however.  Denies any hematuria, fevers, nausea or vomiting.  She does report mild low back pain that started yesterday.  She has had recurrent UTIs and was supposed to see a urologist today but it had to be canceled due to them being out.  She will not see them until 01/12/2023.  She admits that she probably does not hydrate adequately.  She is currently on a GLP and this suppresses her appetite.   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   Deep incisional surgical site infection 07/01/2015   Diabetes mellitus without complication (HCC)    borderline; diet controlled   Dysrhythmia    Essential hypertension 07/26/2019   Family history of adverse reaction to anesthesia    Patients son has bad N/V   GAD (generalized anxiety disorder) 06/26/2017   Generalized edema 03/24/2017   GERD (gastroesophageal reflux disease)    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   Migraine    Palpitations 07/26/2019   Panic attack 06/26/2017   PONV (postoperative nausea and vomiting)    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 For 5 days at first sign of cold sores flare, Disp: 30 tablet, Rfl: 0   albuterol (VENTOLIN HFA) 108 (90 Base) MCG/ACT inhaler, Inhale 1-2 puffs into the lungs every 6 (six) hours as needed for wheezing or shortness of breath., Disp: 1 each, Rfl: 0   Blood Glucose Monitoring Suppl DEVI, Check sugars up to 2 times daily. May substitute to any manufacturer covered by patient's insurance. E11.9, Disp: 1 each, Rfl: 0   cephALEXin (KEFLEX) 500 MG capsule, Take 1 capsule (500 mg total) by mouth 2 (two) times daily. (Patient taking differently: Take 1,000 mg by mouth daily.), Disp: 14 capsule, Rfl: 0   citalopram (CELEXA) 40 MG tablet, TAKE 1 Tablet BY MOUTH ONCE DAILY FOR MOOD, Disp: 90 tablet, Rfl: 3   Glucose Blood (BLOOD GLUCOSE TEST STRIPS) STRP, Check sugar up to 2 times daily. May substitute to any manufacturer covered by patient's insurance. E11.9, Disp: 100 strip, Rfl: 3   Lancet Device MISC, Check sugar up to 2 times daily. May substitute to any manufacturer covered by patient's insurance. E11.9, Disp: 1 each, Rfl: 0   Lancets Misc. MISC, Check sugar up to 2 times daily. May substitute to any manufacturer covered by patient's insurance.E11.9, Disp: 100 each, Rfl: 3   lidocaine (ASPERCREME LIDOCAINE) 4 %, Place 1 patch onto the skin daily as needed (pain)., Disp: , Rfl:    methocarbamol (ROBAXIN) 750 MG tablet, TAKE 1 TABLET  BY MOUTH EVERY 8 HOURS AS NEEDED FOR MUSCLE SPASMS, Disp: 90 tablet, Rfl: 1   metoprolol tartrate (LOPRESSOR) 50 MG tablet, TAKE 1 Tablet  BY MOUTH TWICE DAILY FOR HEART, Disp: 180 tablet, Rfl: 3   ondansetron (ZOFRAN-ODT) 4 MG disintegrating tablet, Take 1 tablet (4 mg total) by mouth every 8 (eight) hours as needed for nausea or vomiting., Disp: 20 tablet, Rfl: 0   pantoprazole (PROTONIX) 40 MG tablet, Take 1 tablet (40 mg total) by mouth 2 (two) times daily before a meal., Disp: 90 tablet, Rfl: 3   pregabalin (LYRICA) 50 MG capsule, Take 1 capsule (50 mg total) by  mouth 2 (two) times daily. Place on file (Patient taking differently: Take 50 mg by mouth 2 (two) times daily as needed (pain). Place on file), Disp: 180 capsule, Rfl: 1   rosuvastatin (CRESTOR) 10 MG tablet, Take 1 tablet (10 mg total) by mouth daily. For cholesterol/ fatty liver, Disp: 90 tablet, Rfl: 3   SUMAtriptan (IMITREX) 50 MG tablet, Take 1 tablet (50 mg total) by mouth daily as needed for migraine. May repeat in 4 hours if headache persists or recurs., Disp: 6 tablet, Rfl: PRN   tirzepatide (MOUNJARO) 7.5 MG/0.5ML Pen, Inject 7.5 mg into the skin once a week. DX: E11.65, Disp: 6 mL, Rfl: 1 Social History   Socioeconomic History   Marital status: Divorced    Spouse name: Not on file   Number of children: 1   Years of education: Not on file   Highest education level: Some college, no degree  Occupational History   Not on file  Tobacco Use   Smoking status: Former    Current packs/day: 0.00    Types: Cigarettes    Quit date: 06/02/2014    Years since quitting: 8.6   Smokeless tobacco: Never  Vaping Use   Vaping status: Never Used  Substance and Sexual Activity   Alcohol use: No   Drug use: Not Currently    Types: Marijuana    Comment: none since a teenager   Sexual activity: Not Currently    Birth control/protection: Surgical  Other Topics Concern   Not on file  Social History Narrative   Not on file   Social Determinants of Health   Financial Resource Strain: Not on file  Food Insecurity: No Food Insecurity (02/28/2022)   Hunger Vital Sign    Worried About Running Out of Food in the Last Year: Never true    Ran Out of Food in the Last Year: Never true  Transportation Needs: No Transportation Needs (02/28/2022)   PRAPARE - Administrator, Civil Service (Medical): No    Lack of Transportation (Non-Medical): No  Physical Activity: Not on file  Stress: Not on file  Social Connections: Not on file  Intimate Partner Violence: Not on file   Family History   Problem Relation Age of Onset   Heart disease Mother    Diabetes Mother    Cancer Father        bladder   Autism Brother    Diabetes Brother    Heart disease Brother    Diabetes Brother    Diabetes Brother    Breast cancer Maternal Aunt        double mastectomy    Objective: Office vital signs reviewed. BP 119/80   Pulse 78   Temp 98.6 F (37 C)   Ht 5\' 7"  (1.702 m)   Wt 283 lb (128.4 kg)   LMP 02/24/2013  SpO2 94%   BMI 44.32 kg/m   Physical Examination:  General: Awake, alert, well nourished, No acute distress GU: No appreciable rashes in the groin.  No CVA tenderness  Assessment/ Plan: 47 y.o. female   Acute cystitis without hematuria - Plan: nitrofurantoin, macrocrystal-monohydrate, (MACROBID) 100 MG capsule, fluconazole (DIFLUCAN) 150 MG tablet  Recurrent UTI - Plan: Urine Culture, Urinalysis, Routine w reflex microscopic, nitrofurantoin, macrocrystal-monohydrate, (MACROBID) 100 MG capsule, fluconazole (DIFLUCAN) 150 MG tablet  Recurrent UTI.  Macrobid ordered.  Diflucan sent given reports of yeast.  Keep appointment with urology.  Reinforced need for adequate water consumption.  Cultures pending  Orders Placed This Encounter  Procedures   Urine Culture   Urinalysis, Routine w reflex microscopic   No orders of the defined types were placed in this encounter.    Raliegh Ip, DO [facility] [phone]

## 2023-01-07 ENCOUNTER — Telehealth: Payer: Self-pay | Admitting: Pharmacist

## 2023-01-07 NOTE — Telephone Encounter (Signed)
  Patient on ozempic 1mg  currently Tried/failed metformin Will seek Mounjaro approval patient denies personal or family history of medullary thyroid carcinoma (MTC) or in patients with Multiple Endocrine Neoplasia syndrome type 2 (MEN 2) PA submitted

## 2023-01-08 ENCOUNTER — Telehealth: Payer: Medicaid Other

## 2023-01-08 NOTE — Telephone Encounter (Signed)
Patient notified Instructed her to call the pharmacy

## 2023-01-12 ENCOUNTER — Ambulatory Visit: Payer: Medicaid Other | Admitting: Urology

## 2023-01-12 DIAGNOSIS — R339 Retention of urine, unspecified: Secondary | ICD-10-CM

## 2023-01-13 ENCOUNTER — Ambulatory Visit: Payer: Medicaid Other | Admitting: Urology

## 2023-01-14 ENCOUNTER — Ambulatory Visit: Payer: Medicaid Other | Admitting: Urology

## 2023-01-19 ENCOUNTER — Ambulatory Visit (HOSPITAL_COMMUNITY)
Admission: RE | Admit: 2023-01-19 | Discharge: 2023-01-19 | Disposition: A | Discharge status: Home / Self Care | Payer: Medicaid Other | Source: Ambulatory Visit | Attending: Urology | Admitting: Urology

## 2023-01-19 ENCOUNTER — Encounter: Payer: Self-pay | Admitting: Urology

## 2023-01-19 ENCOUNTER — Ambulatory Visit: Payer: Medicaid Other | Admitting: Urology

## 2023-01-19 VITALS — BP 112/72 | HR 86

## 2023-01-19 DIAGNOSIS — Z87440 Personal history of urinary (tract) infections: Secondary | ICD-10-CM | POA: Diagnosis not present

## 2023-01-19 DIAGNOSIS — R93421 Abnormal radiologic findings on diagnostic imaging of right kidney: Secondary | ICD-10-CM

## 2023-01-19 DIAGNOSIS — N200 Calculus of kidney: Secondary | ICD-10-CM

## 2023-01-19 DIAGNOSIS — R109 Unspecified abdominal pain: Secondary | ICD-10-CM | POA: Diagnosis not present

## 2023-01-19 DIAGNOSIS — N390 Urinary tract infection, site not specified: Secondary | ICD-10-CM

## 2023-01-19 LAB — URINALYSIS, ROUTINE W REFLEX MICROSCOPIC
Bilirubin, UA: NEGATIVE
Glucose, UA: NEGATIVE
Ketones, UA: NEGATIVE
Leukocytes,UA: NEGATIVE
Nitrite, UA: NEGATIVE
Protein,UA: NEGATIVE
RBC, UA: NEGATIVE
Specific Gravity, UA: 1.025 (ref 1.005–1.030)
Urobilinogen, Ur: 0.2 mg/dL (ref 0.2–1.0)
pH, UA: 7 (ref 5.0–7.5)

## 2023-01-19 NOTE — Progress Notes (Unsigned)
01/19/2023 1:28 PM   Cheryl Randall [birth year] [id number]  Referring provider: Junie Spencer, FNP [address] [place],  [place] [address]  No chief complaint on file.   HPI:  New pt -   1) UTI - she had dysuria and frequency. Urine cx grew e coli. No gross hematuria. Abx help but she got "two more infections" back to back. Abx cleared her symptoms again. No GU surgery. No NG risk. She drinks soda mainly ginger ale. Occasional constipation.    Abd US showed normal kidneys in May 2024. She may have had a 7 mm RUP stone. A 2017 CT showed no stones.   UA today normal.   PMH: 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   Deep incisional surgical site infection 07/01/2015   Diabetes mellitus without complication (HCC)    borderline; diet controlled   Dysrhythmia    Essential hypertension 07/26/2019   Family history of adverse reaction to anesthesia    Patients son has bad N/V   GAD (generalized anxiety disorder) 06/26/2017   Generalized edema 03/24/2017   GERD (gastroesophageal reflux disease)    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   Migraine    Palpitations 07/26/2019   Panic attack 06/26/2017   PONV (postoperative nausea and vomiting)    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   Staphylococcus aureus bacteremia with sepsis (HCC)    Wound infection 06/30/2015    Surgical History: Past Surgical History:  Procedure Laterality Date   ABDOMINAL HYSTERECTOMY     20s   BACK SURGERY     x4   BIOPSY  12/15/2022   Procedure: BIOPSY;  Surgeon: Lanelle Bal, DO;  [facility]: [facility];  Service: Endoscopy;;   BREAST SURGERY Bilateral    reduction   CHOLECYSTECTOMY     COLONOSCOPY WITH PROPOFOL N/A 12/15/2022   Procedure: COLONOSCOPY WITH PROPOFOL;  Surgeon:  Lanelle Bal, DO;  Location: [facility];  Service: Endoscopy;  Laterality: N/A;  10:30 am, asa 3   ESOPHAGOGASTRODUODENOSCOPY (EGD) WITH PROPOFOL N/A 12/15/2022   Procedure: ESOPHAGOGASTRODUODENOSCOPY (EGD) WITH PROPOFOL;  Surgeon: Lanelle Bal, DO;  Location: [facility];  Service: Endoscopy;  Laterality: N/A;   LUMBAR LAMINECTOMY/DECOMPRESSION MICRODISCECTOMY N/A 06/30/2015   Procedure: wound exploration irrigation and debridement ,explantation of bone growth stimulator, placement of wound vac;  Surgeon: Loura Halt Ditty, MD;  Location: [facility];  Service: Neurosurgery;  Laterality: N/A;    Home Medications:  Allergies as of 01/19/2023       Reactions   Codeine Itching, Rash   Latex Itching, Rash   Norco [hydrocodone-acetaminophen] Itching, Rash   Tape Itching, Rash        Medication List        Accurate as of January 19, 2023  1:28 PM. If you have any questions, ask your nurse or doctor.          acyclovir 400 MG tablet Commonly known as: ZOVIRAX Take 1 tablet (400 mg total) by mouth 3 (three) times daily. For 5 days at first sign of cold sores flare   albuterol 108 (90 Base) MCG/ACT inhaler Commonly known as: VENTOLIN HFA Inhale 1-2 puffs into the lungs every 6 (six) hours as needed for wheezing or shortness of breath.   Aspercreme Lidocaine 4 %  Generic drug: lidocaine Place 1 patch onto the skin daily as needed (pain).   Blood Glucose Monitoring Suppl Devi Check sugars up to 2 times daily. May substitute to any manufacturer covered by patient's insurance. E11.9   BLOOD GLUCOSE TEST STRIPS Strp Check sugar up to 2 times daily. May substitute to any manufacturer covered by patient's insurance. E11.9   cephALEXin 500 MG capsule Commonly known as: KEFLEX Take 1 capsule (500 mg total) by mouth 2 (two) times daily. What changed:  how much to take when to take this   citalopram 40 MG tablet Commonly known as: CELEXA TAKE 1 Tablet BY MOUTH ONCE  DAILY FOR MOOD   Lancet Device Misc Check sugar up to 2 times daily. May substitute to any manufacturer covered by patient's insurance. E11.9   Lancets Misc. Misc Check sugar up to 2 times daily. May substitute to any manufacturer covered by patient's insurance.E11.9   methocarbamol 750 MG tablet Commonly known as: ROBAXIN TAKE 1 TABLET BY MOUTH EVERY 8 HOURS AS NEEDED FOR MUSCLE SPASMS   metoprolol tartrate 50 MG tablet Commonly known as: LOPRESSOR TAKE 1 Tablet  BY MOUTH TWICE DAILY FOR HEART   ondansetron 4 MG disintegrating tablet Commonly known as: ZOFRAN-ODT Take 1 tablet (4 mg total) by mouth every 8 (eight) hours as needed for nausea or vomiting.   pantoprazole 40 MG tablet Commonly known as: Protonix Take 1 tablet (40 mg total) by mouth 2 (two) times daily before a meal.   pregabalin 50 MG capsule Commonly known as: Lyrica Take 1 capsule (50 mg total) by mouth 2 (two) times daily. Place on file What changed:  when to take this reasons to take this   rosuvastatin 10 MG tablet Commonly known as: Crestor Take 1 tablet (10 mg total) by mouth daily. For cholesterol/ fatty liver   SUMAtriptan 50 MG tablet Commonly known as: Imitrex Take 1 tablet (50 mg total) by mouth daily as needed for migraine. May repeat in 4 hours if headache persists or recurs.   tirzepatide 7.5 MG/0.5ML Pen Commonly known as: MOUNJARO Inject 7.5 mg into the skin once a week. DX: E11.65        Allergies:  Allergies  Allergen Reactions   Codeine Itching and Rash   Latex Itching and Rash   Norco [Hydrocodone-Acetaminophen] Itching and Rash   Tape Itching and Rash    Family History: Family History  Problem Relation Age of Onset   Heart disease Mother    Diabetes Mother    Cancer Father        bladder   Autism Brother    Diabetes Brother    Heart disease Brother    Diabetes Brother    Diabetes Brother    Breast cancer Maternal Aunt        double mastectomy    Social  History:  reports that she quit smoking about 8 years ago. Her smoking use included cigarettes. She has never used smokeless tobacco. She reports that she does not currently use drugs after having used the following drugs: Marijuana. She reports that she does not drink alcohol.   Physical Exam: BP 112/72   Pulse 86   LMP 02/24/2013   Constitutional:  Alert and oriented, No acute distress. HEENT: [place] AT, moist mucus membranes.  Trachea midline, no masses. Cardiovascular: No clubbing, cyanosis, or edema. Respiratory: Normal respiratory effort, no increased work of breathing. GI: Abdomen is soft, nontender, nondistended, no abdominal masses GU: No CVA tenderness Skin: No rashes,  bruises or suspicious lesions. Neurologic: Grossly intact, no focal deficits, moving all 4 extremities. Psychiatric: Normal mood and affect.  Laboratory Data: Lab Results  Component Value Date   WBC 5.3 10/31/2022   HGB 13.6 10/31/2022   HCT 42.8 10/31/2022   MCV 88.2 10/31/2022   PLT 225 10/31/2022    Lab Results  Component Value Date   CREATININE 0.82 10/31/2022    No results found for: "PSA"  No results found for: "TESTOSTERONE"  Lab Results  Component Value Date   HGBA1C 5.6 11/07/2022    Urinalysis    Component Value Date/Time   COLORURINE YELLOW 10/31/2022 1505   APPEARANCEUR Cloudy (A) 01/05/2023 1348   LABSPEC 1.013 10/31/2022 1505   PHURINE 6.0 10/31/2022 1505   GLUCOSEU Negative 01/05/2023 1348   HGBUR NEGATIVE 10/31/2022 1505   BILIRUBINUR Negative 01/05/2023 1348   KETONESUR NEGATIVE 10/31/2022 1505   PROTEINUR Trace (A) 01/05/2023 1348   PROTEINUR NEGATIVE 10/31/2022 1505   NITRITE Negative 01/05/2023 1348   NITRITE NEGATIVE 10/31/2022 1505   LEUKOCYTESUR 2+ (A) 01/05/2023 1348   LEUKOCYTESUR NEGATIVE 10/31/2022 1505    Lab Results  Component Value Date   LABMICR See below: 01/05/2023   WBCUA >30 (A) 01/05/2023   RBCUA None seen 06/22/2018   LABEPIT None seen  01/05/2023   MUCUS Present 06/22/2018   BACTERIA Moderate (A) 01/05/2023    Pertinent Imaging: Abd [name] - May 2024  No valid procedures specified. No results found for this or any previous visit.  Results for orders placed during the hospital encounter of 01/13/16  CT Renal Stone Study  Narrative CLINICAL DATA:  Acute onset of left-sided abdominal and back pain, with nausea and vomiting. Initial encounter.  EXAM: CT ABDOMEN AND PELVIS WITHOUT CONTRAST  TECHNIQUE: Multidetector CT imaging of the abdomen and pelvis was performed following the standard protocol without IV contrast.  COMPARISON:  CT of the abdomen and pelvis from 02/24/2010  FINDINGS: The visualized lung bases are clear.  The liver and spleen are unremarkable in appearance. The patient is status post cholecystectomy, with clips noted at the gallbladder fossa. The pancreas and adrenal glands are unremarkable.  The kidneys are unremarkable in appearance. There is no evidence of hydronephrosis. No renal or ureteral stones are seen. No perinephric stranding is appreciated.  No free fluid is identified. The small bowel is unremarkable in appearance. The stomach is within normal limits. No acute vascular abnormalities are seen.  The appendix is normal in caliber and contains air, without evidence of appendicitis. The colon is largely decompressed and grossly unremarkable in appearance.  The bladder is mildly distended and grossly unremarkable. The patient is status post hysterectomy. No suspicious adnexal masses are seen. The ovaries are relatively symmetric and unremarkable. No inguinal lymphadenopathy is seen.  No acute osseous abnormalities are identified. Lumbosacral spinal fusion hardware is noted at L5-S1.  IMPRESSION: Unremarkable noncontrast CT of the abdomen and pelvis.   Electronically Signed By: Roanna Raider M.D. On: 01/13/2016 04:14   Assessment & Plan:    1. Urinary tract infection  without hematuria, site unspecified -drink more water, take d-mannose and probiotics. Not uncommon to get a rebound UTI p abx. No worrisome findings on [name].   She asked about prophylaxis. I would not recommend a daily prophylactic abx at this point.   - Urinalysis, Routine w reflex microscopic - BLADDER SCAN AMB NON-IMAGING  2. Possible kidney stone - check KUB .   No follow-ups on file.  Jerilee Field, MD

## 2023-01-29 ENCOUNTER — Ambulatory Visit (INDEPENDENT_AMBULATORY_CARE_PROVIDER_SITE_OTHER): Payer: Medicaid Other | Admitting: Pharmacist

## 2023-01-29 ENCOUNTER — Telehealth: Payer: Medicaid Other

## 2023-01-29 DIAGNOSIS — Z7985 Long-term (current) use of injectable non-insulin antidiabetic drugs: Secondary | ICD-10-CM

## 2023-01-29 DIAGNOSIS — E119 Type 2 diabetes mellitus without complications: Secondary | ICD-10-CM

## 2023-01-29 MED ORDER — TIRZEPATIDE 10 MG/0.5ML ~~LOC~~ SOAJ
10.0000 mg | SUBCUTANEOUS | 3 refills | Status: DC
Start: 2023-01-29 — End: 2023-03-16

## 2023-01-29 NOTE — Progress Notes (Signed)
01/29/2023 Name: Cheryl Randall MRN: [id number] DOB: 07/17/1975  Chief Complaint  Patient presents with   Diabetes   Weight Loss    Cheryl Randall is a 47 y.o. year old female  with PMHx of DM, HTN, and DDD, who presented for a telephone visit. I connected with  Cheryl Randall on 01/29/23 by telephone and verified that I am speaking with the correct person using two identifiers.  I discussed the limitations of evaluation and management by telemedicine. The patient expressed understanding and agreed to proceed.  Patient was located in her home and PharmD in PCP office during this visit.  They were referred to the pharmacist by their PCP for assistance in managing diabetes and weight loss. Cheryl Randall reports that Cheryl Randall 7.5 mg has not been suppressing her appetite as well as Ozempic. Instead of eating smaller snacks throughout the day she is finding herself craving larger meals. She especially is craving anything with sugar. Her weight loss has slowed. Currently she is around 280 lbs.    She continues to monitor her BG. She reports her readings have been normal, typically in the 90s-100s. She has not been engaging in physical activity.   She expressed interest in increasing her Cheryl Randall dose. She is very motivated to continue to lose weight, as she was recently diagnosed with fatty liver. She typically takes her injection on Thursdays. She denied any adverse effects associated with Cheryl Randall (GI distress, diarrhea, etc.) patient denies personal or family history of medullary thyroid carcinoma (MTC) or in patients with Multiple Endocrine Neoplasia syndrome type 2 (MEN 2)  Subjective:  Care Team: Primary Care Provider: Raliegh Ip, DO   Medication Access/Adherence  Current Pharmacy:  [facility] [place] - [address] [place] [address] Phone: [phone] Fax: [phone]  [facility] [place], [place] - [address], [place]  [address] [place] [address] Phone: [phone] Fax: [phone]   Patient reports affordability concerns with their medications: No  Patient reports access/transportation concerns to their pharmacy: No  Patient reports adherence concerns with their medications:  No    Diabetes:  Current medications:  Cheryl Randall Medications tried in the past: metformin, Ozempic  Current glucose readings: 90s-100s  Patient denies hypoglycemic s/sx including dizziness, shakiness, sweating. Patient denies hyperglycemic symptoms including polyuria, polydipsia, polyphagia, nocturia, neuropathy, blurred vision.  Current physical activity: Not currently engaging in physical activity.   Objective:  Lab Results  Component Value Date   HGBA1C 5.6 11/07/2022    Lab Results  Component Value Date   CREATININE 0.82 10/31/2022   BUN 10 10/31/2022   NA 135 10/31/2022   K 4.0 10/31/2022   CL 103 10/31/2022   CO2 24 10/31/2022    Lab Results  Component Value Date   CHOL 156 11/18/2021   HDL 50 11/18/2021   LDLCALC 85 11/18/2021   TRIG 103 11/18/2021   CHOLHDL 3.1 11/18/2021    Medications Reviewed Today     Reviewed by Gwenlyn Found, RPH (Pharmacist) on 01/29/23 at 1533  Med List Status: <None>   Medication Order Taking? Sig Documenting Provider Last Dose Status Informant  acyclovir (ZOVIRAX) 400 MG tablet [id number] No Take 1 tablet (400 mg total) by mouth 3 (three) times daily. For 5 days at first sign of cold sores flare Delynn Flavin M, DO Taking Active Self  albuterol (VENTOLIN HFA) 108 (90 Base) MCG/ACT inhaler [id number] No Inhale 1-2  puffs into the lungs every 6 (six) hours as needed for wheezing or shortness of breath. Junie Spencer, FNP Taking Active Self  Blood Glucose Monitoring Suppl DEVI [id number] No Check sugars up to 2 times daily. May substitute to any manufacturer covered by patient's insurance. E11.9 Delynn Flavin M, DO Taking Active  Self  cephALEXin (KEFLEX) 500 MG capsule [id number] No Take 1 capsule (500 mg total) by mouth 2 (two) times daily.  Patient taking differently: Take 1,000 mg by mouth daily.   Junie Spencer, FNP Taking Active Self  citalopram (CELEXA) 40 MG tablet [id number] No TAKE 1 Tablet BY MOUTH ONCE DAILY FOR MOOD Delynn Flavin M, DO Taking Active Self  Glucose Blood (BLOOD GLUCOSE TEST STRIPS) STRP [id number] No Check sugar up to 2 times daily. May substitute to any manufacturer covered by patient's insurance. E11.9 Raliegh Ip, DO Taking Active Self  Lancet Device MISC [id number] No Check sugar up to 2 times daily. May substitute to any manufacturer covered by patient's insurance. E11.9 Raliegh Ip, DO Taking Active Self  Lancets Misc. MISC [id number] No Check sugar up to 2 times daily. May substitute to any manufacturer covered by patient's insurance.E11.9 Delynn Flavin M, DO Taking Active Self  lidocaine (ASPERCREME LIDOCAINE) 4 % [id number] No Place 1 patch onto the skin daily as needed (pain). [provider] Taking Active Self  methocarbamol (ROBAXIN) 750 MG tablet [id number] No TAKE 1 TABLET BY MOUTH EVERY 8 HOURS AS NEEDED FOR MUSCLE SPASMS Delynn Flavin M, DO Taking Active Self  metoprolol tartrate (LOPRESSOR) 50 MG tablet [id number] No TAKE 1 Tablet  BY MOUTH TWICE DAILY FOR HEART Delynn Flavin M, DO Taking Active Self  ondansetron (ZOFRAN-ODT) 4 MG disintegrating tablet [id number] No Take 1 tablet (4 mg total) by mouth every 8 (eight) hours as needed for nausea or vomiting. Junie Spencer, FNP Taking Active Self  pantoprazole (PROTONIX) 40 MG tablet [id number] No Take 1 tablet (40 mg total) by mouth 2 (two) times daily before a meal. Aida Raider, NP Taking Active Self  pregabalin (LYRICA) 50 MG capsule [id number] No Take 1 capsule (50 mg total) by mouth 2 (two) times daily. Place on file  Patient taking differently: Take 50 mg by mouth 2 (two) times daily as  needed (pain). Place on file   Delynn Flavin M, DO Taking Active Self  rosuvastatin (CRESTOR) 10 MG tablet [id number] No Take 1 tablet (10 mg total) by mouth daily. For cholesterol/ fatty liver Delynn Flavin M, DO Taking Active Self  SUMAtriptan (IMITREX) 50 MG tablet [id number] No Take 1 tablet (50 mg total) by mouth daily as needed for migraine. May repeat in 4 hours if headache persists or recurs. Raliegh Ip, DO Taking Active Self             Assessment/Plan:   Diabetes: - Currently controlled. A1c 5.6, BG 90-100s - Reviewed long term cardiovascular and renal outcomes of uncontrolled blood sugar. - Reviewed goal A1c, goal fasting, and goal 2 hour post prandial glucose. - Reviewed dietary modifications including limiting sugar and carbohydrate intake.  - Reviewed lifestyle modifications including engaging in at least 150 minutes of moderate exercise per week.  - Recommend to increase to Cheryl Randall 10 mg subcutaneously once weekly.   - Recommend to check glucose daily (fasting) or if symptomatic   Follow Up Plan: F/u in 4 weeks.   Sofie Rower, PharmD, [facility] PGY1  Kieth Brightly, PharmD, BCACP Clinical Pharmacist, [facility]

## 2023-02-03 ENCOUNTER — Other Ambulatory Visit (HOSPITAL_COMMUNITY): Payer: Self-pay | Admitting: Family Medicine

## 2023-02-03 DIAGNOSIS — Z1231 Encounter for screening mammogram for malignant neoplasm of breast: Secondary | ICD-10-CM

## 2023-02-17 ENCOUNTER — Encounter: Payer: Medicaid Other | Attending: Family Medicine | Admitting: Nutrition

## 2023-02-17 VITALS — Ht 67.5 in | Wt 278.2 lb

## 2023-02-17 DIAGNOSIS — I10 Essential (primary) hypertension: Secondary | ICD-10-CM | POA: Diagnosis not present

## 2023-02-17 DIAGNOSIS — Z713 Dietary counseling and surveillance: Secondary | ICD-10-CM | POA: Insufficient documentation

## 2023-02-17 DIAGNOSIS — E118 Type 2 diabetes mellitus with unspecified complications: Secondary | ICD-10-CM | POA: Insufficient documentation

## 2023-02-17 DIAGNOSIS — Z6841 Body Mass Index (BMI) 40.0 and over, adult: Secondary | ICD-10-CM | POA: Diagnosis not present

## 2023-02-17 NOTE — Patient Instructions (Signed)
Goals  Go to water aerobics 2 twice a week Cut out veggies straws and choose more fruit and veggies

## 2023-02-17 NOTE — Progress Notes (Unsigned)
Medical Nutrition Therapy  Appointment Start time:  1330 Appointment End time:  1400 Primary concerns today: Obesity, DM Type 2  Referral diagnosis: E11.8,E66.01  Preferred learning style: See , visuals.   Learning readiness: Ready   Has lost 11 lbs. Has changed;   NUTRITION ASSESSMENT Follow up Lab Results  Component Value Date   HGBA1C 5.6 11/07/2022   HGBA1C 7.1 (H) 08/04/2022   HGBA1C 5.9 (H) 11/18/2021     CHanges made: watching what she eats. Watching portions. Choosing healthier foods. Not able to walka lot due to her leg and back hurt. Willing to try chair exercises. Still on Ozmepic. Lost another 6 lbs. Doesn't get hungry. She notes the Ozempic helps her reduce food cravings and not having thoughts about food between meals, Has cut out sodas and a lot of junk food. Drinking water now. FBS 110-120's. Hasn't been checking BS at night. Doesn't eat late anymore and is eating before 7 pm. Will get A1C rechecked at next PCP visit next month. Hasn't been able to get paperwork done for the [facility] yet. Working on it and will talk to [facility] today. Diet is still low in fresh fruits, vegetables and whole grains. Meals need to be better balanced of higher fiber foods and more whole foods.   Lab Results  Component Value Date   HGBA1C 5.6 11/07/2022     Anthropometrics  Wt Readings from Last 3 Encounters:  01/05/23 283 lb (128.4 kg)  12/11/22 287 lb 3.2 oz (130.3 kg)  11/20/22 287 lb 3.2 oz (130.3 kg)   Ht Readings from Last 3 Encounters:  01/05/23 5\' 7"  (1.702 m)  12/11/22 5\' 7"  (1.702 m)  11/20/22 5\' 7"  (1.702 m)   There is no height or weight on file to calculate BMI. @BMIFA @ Facility age limit for growth %iles is 20 years. Facility age limit for growth %iles is 20 years.   Clinical Medical Hx: See chart Medications: Switched to [facility]  Labs:  Lab Results  Component Value Date   HGBA1C 5.6 11/07/2022      Latest Ref Rng & Units 10/31/2022    1:57 PM 08/04/2022     2:38 PM 11/18/2021   10:46 AM  CMP  Glucose 70 - 99 mg/dL 102  585  277   BUN 6 - 20 mg/dL 10  12  20    Creatinine 0.44 - 1.00 mg/dL 8.24  2.35  3.61   Sodium 135 - 145 mmol/L 135  135  137   Potassium 3.5 - 5.1 mmol/L 4.0  4.2  4.3   Chloride 98 - 111 mmol/L 103  101  107   CO2 22 - 32 mmol/L 24  20  24    Calcium 8.9 - 10.3 mg/dL 8.7  9.2  8.9   Total Protein 6.5 - 8.1 g/dL 7.0  6.4  7.0   Total Bilirubin 0.3 - 1.2 mg/dL 0.5  0.3  0.5   Alkaline Phos 38 - 126 U/L 48  83  60   AST 15 - 41 U/L 21  20  22    ALT 0 - 44 U/L 18  20  25     Lipid Panel     Component Value Date/Time   CHOL 156 11/18/2021 1045   CHOL 165 07/26/2019 1119   TRIG 103 11/18/2021 1045   HDL 50 11/18/2021 1045   HDL 59 07/26/2019 1119   CHOLHDL 3.1 11/18/2021 1045   VLDL 21 11/18/2021 1045   LDLCALC 85 11/18/2021 1045   LDLCALC  90 07/26/2019 1119   LABVLDL 16 07/26/2019 1119    Notable Signs/Symptoms: none, craves sugars  Lifestyle & Dietary Hx Lives with her son. Currently not working.Applied for disability. Can't stand long on her feet to chronic back pain.  Estimated daily fluid intake: 30-40 oz Supplements: none Sleep: 6-8 hrs Stress / self-care: none Current average weekly physical activity: ADL  24-Hr Dietary Recall First Meal: Bagel plain with fruit Second Meal:  Snack: Low sea salt veggie straw.  Third Meal:  8 pm Corn on cob, 1 cup red potatoes, 1 c mac/cheese, water and gingerale. Estimated Energy Needs Calories: 1200 Carbohydrate: 135g Protein: 90g Fat: 33g   NUTRITION DIAGNOSIS  NB-1.1 Food and nutrition-related knowledge deficit As related to High sugar high calorie processed diet.  As evidenced by DM Type 2 and BMI 47,.   NUTRITION INTERVENTION  Nutrition education (E-1) on the following topics:  Nutrition and Diabetes education provided on My Plate, CHO counting, meal planning, portion sizes, timing of meals, avoiding snacks between meals unless having a low blood sugar,  target ranges for A1C and blood sugars, signs/symptoms and treatment of hyper/hypoglycemia, monitoring blood sugars, taking medications as prescribed, benefits of exercising 30 minutes per day and prevention of complications of DM.   Lifestyle Medicine  - Whole Food, Plant Predominant Nutrition is highly recommended: Eat Plenty of vegetables, Mushrooms, fruits, Legumes, Whole Grains, Nuts, seeds in lieu of processed meats, processed snacks/pastries red meat, poultry, eggs.    -It is better to avoid simple carbohydrates including: Cakes, Sweet Desserts, Ice Cream, Soda (diet and regular), Sweet Tea, Candies, Chips, Cookies, Store Bought Juices, Alcohol in Excess of  1-2 drinks a day, Lemonade,  Artificial Sweeteners, Doughnuts, Coffee Creamers, "Sugar-free" Products, etc, etc.  This is not a complete list.....  Exercise: If you are able: 30 -60 minutes a day ,4 days a week, or 150 minutes a week.  The longer the better.  Combine stretch, strength, and aerobic activities.  If you were told in the past that you have high risk for cardiovascular diseases, you may seek evaluation by your heart doctor prior to initiating moderate to intense exercise programs.    Handouts Provided Include  Lifestyle Medicine Know your numbers CVD risk and DM  Learning Style & Readiness for Change Teaching method utilized: Visual & Auditory  Demonstrated degree of understanding via: Teach Back  Barriers to learning/adherence to lifestyle change: back pain limiting walking.  Goals Established by Pt Goals Keep up the great jobs.  Increase protein and dried beans and peas-Improved some Increase  to eating 5-6 servings of fruits and vegetables-improved Try some chair exercises- hasn't done Get A1C down to 6%  MONITORING & EVALUATION Dietary intake, weekly physical activity, and bloods sugars in 1 month.  Next Steps  Patient is to work on eating more whole plant based foods and drinking only water.Marland Kitchen

## 2023-02-18 ENCOUNTER — Telehealth: Payer: Self-pay | Admitting: Family Medicine

## 2023-02-18 DIAGNOSIS — M5137 Other intervertebral disc degeneration, lumbosacral region: Secondary | ICD-10-CM

## 2023-02-18 DIAGNOSIS — Z9889 Other specified postprocedural states: Secondary | ICD-10-CM

## 2023-02-18 NOTE — Telephone Encounter (Signed)
REFERRAL REQUEST Telephone Note  Have you been seen at our office for this problem? YES (Advise that they may need an appointment with their PCP before a referral can be done)  Reason for Referral: Lower Back Referral discussed with patient: YES  Best contact number of patient for referral team: [phone]    Has patient been seen by a specialist for this issue before: YES  Patient provider preference for referral: Dr. Ethelene Hal Patient location preference for referral: [facility]   Patient notified that referrals can take up to a week or longer to process. If they haven't heard anything within a week they should call back and speak with the referral department.

## 2023-02-19 ENCOUNTER — Encounter: Payer: Self-pay | Admitting: Nutrition

## 2023-02-24 NOTE — Telephone Encounter (Signed)
Done  Orders Placed This Encounter  Procedures   Ambulatory referral to Orthopedic Surgery    Referral Priority:   Routine    Referral Type:   Surgical    Referral Reason:   Specialty Services Required    Requested Specialty:   Orthopedic Surgery    Number of Visits Requested:   1

## 2023-03-09 ENCOUNTER — Encounter (HOSPITAL_COMMUNITY): Payer: Self-pay

## 2023-03-09 ENCOUNTER — Ambulatory Visit (HOSPITAL_COMMUNITY)
Admission: RE | Admit: 2023-03-09 | Discharge: 2023-03-09 | Disposition: A | Discharge status: Home / Self Care | Payer: Medicaid Other | Source: Ambulatory Visit | Attending: Family Medicine | Admitting: Family Medicine

## 2023-03-09 DIAGNOSIS — Z1231 Encounter for screening mammogram for malignant neoplasm of breast: Secondary | ICD-10-CM | POA: Diagnosis not present

## 2023-03-16 ENCOUNTER — Encounter: Payer: Self-pay | Admitting: Family Medicine

## 2023-03-16 ENCOUNTER — Encounter: Payer: Self-pay | Admitting: Internal Medicine

## 2023-03-16 ENCOUNTER — Other Ambulatory Visit: Payer: Self-pay | Admitting: Family Medicine

## 2023-03-16 DIAGNOSIS — E119 Type 2 diabetes mellitus without complications: Secondary | ICD-10-CM

## 2023-03-16 MED ORDER — TIRZEPATIDE 12.5 MG/0.5ML ~~LOC~~ SOAJ
12.5000 mg | SUBCUTANEOUS | 0 refills | Status: DC
Start: 2023-03-16 — End: 2023-05-28

## 2023-03-23 ENCOUNTER — Ambulatory Visit (INDEPENDENT_AMBULATORY_CARE_PROVIDER_SITE_OTHER): Payer: Medicaid Other | Admitting: *Deleted

## 2023-03-23 ENCOUNTER — Other Ambulatory Visit: Payer: Medicaid Other

## 2023-03-23 DIAGNOSIS — Z7985 Long-term (current) use of injectable non-insulin antidiabetic drugs: Secondary | ICD-10-CM

## 2023-03-23 DIAGNOSIS — E119 Type 2 diabetes mellitus without complications: Secondary | ICD-10-CM | POA: Diagnosis not present

## 2023-03-23 DIAGNOSIS — Z23 Encounter for immunization: Secondary | ICD-10-CM

## 2023-03-23 LAB — BAYER DCA HB A1C WAIVED: HB A1C (BAYER DCA - WAIVED): 5.4 % (ref 4.8–5.6)

## 2023-03-24 LAB — CMP14+EGFR
ALT: 11 [IU]/L (ref 0–32)
AST: 16 [IU]/L (ref 0–40)
Albumin: 4 g/dL (ref 3.9–4.9)
Alkaline Phosphatase: 82 [IU]/L (ref 44–121)
BUN/Creatinine Ratio: 13 (ref 9–23)
BUN: 12 mg/dL (ref 6–24)
Bilirubin Total: 0.3 mg/dL (ref 0.0–1.2)
CO2: 24 mmol/L (ref 20–29)
Calcium: 9.1 mg/dL (ref 8.7–10.2)
Chloride: 101 mmol/L (ref 96–106)
Creatinine, Ser: 0.9 mg/dL (ref 0.57–1.00)
Globulin, Total: 2.8 g/dL (ref 1.5–4.5)
Glucose: 99 mg/dL (ref 70–99)
Potassium: 4.6 mmol/L (ref 3.5–5.2)
Sodium: 138 mmol/L (ref 134–144)
Total Protein: 6.8 g/dL (ref 6.0–8.5)
eGFR: 79 mL/min/{1.73_m2} (ref >59)

## 2023-03-24 LAB — LIPID PANEL
Chol/HDL Ratio: 2.9 ratio (ref 0.0–4.4)
Cholesterol, Total: 141 mg/dL (ref 100–199)
HDL: 48 mg/dL (ref >39)
LDL Chol Calc (NIH): 71 mg/dL (ref 0–99)
Triglycerides: 122 mg/dL (ref 0–149)
VLDL Cholesterol Cal: 22 mg/dL (ref 5–40)

## 2023-04-07 NOTE — Progress Notes (Signed)
CARDIOLOGY CONSULT NOTE       Patient ID: Cheryl Randall MRN: [id number] DOB/AGE: 06-14-1975 47 y.o.  Primary Physician: Raliegh Ip, DO Primary Cardiologist: Eden Emms   HPI: 47 y.o. first seen 08/25/19 with syncope thought due to a vagal reaction at church. Noted some palpitations Monitor 09/21/19 no arrhythmias just short burst PSVT Started on Toprol  Echo 09/26/19 EF 50-55% no valve disease ETT 09/27/19 normal but only reached 78% PMHR She has had some atypical chest pain and family history of premature CAD.   Has 5 brothers and 2 sisters brother Tomma Lightning has Asperger's but working at [employer] getting married soon   She was seen in ER 09/01/20 for dry cough and dyspnea CTA done with  No PE ? Atelectasis vs early pneumonia Rx with Z pack [name] She has some dependant edema from her obesity   Activity limited by back pain has had 4 surgeries most recently 2017 Uses Celexa for depression She hasn't worked in years   I take care of her mom [name] as well  Trying to get disability for lumbar spine issues for a long time.  DM new diagnosis Lost 35 lbs with GLP-1 A1c now in 5.8 range and improved   Discussed calcium score again    ROS All other systems reviewed and negative except as noted above  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   Deep incisional surgical site infection 07/01/2015   Diabetes mellitus without complication (HCC)    borderline; diet controlled   Dysrhythmia    Essential hypertension 07/26/2019   Family history of adverse reaction to anesthesia    Patients son has bad N/V   GAD (generalized anxiety disorder) 06/26/2017   Generalized edema 03/24/2017   GERD (gastroesophageal reflux disease)    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   Irritable bowel syndrome with diarrhea  03/24/2017   Migraine    Palpitations 07/26/2019   Panic attack 06/26/2017   PONV (postoperative nausea and vomiting)    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   Staphylococcus aureus bacteremia with sepsis (HCC)    Wound infection 06/30/2015    Family History  Problem Relation Age of Onset   Heart disease Mother    Diabetes Mother    Cancer Father        bladder   Autism Brother    Diabetes Brother    Heart disease Brother    Diabetes Brother    Diabetes Brother    Breast cancer Maternal Aunt        double mastectomy    Social History   Socioeconomic History   Marital status: Divorced    Spouse name: Not on file   Number of children: 1   Years of education: Not on file   Highest education level: Some college, no degree  Occupational History   Not on file  Tobacco Use   Smoking status: Former    Current packs/day: 0.00    Types: Cigarettes    Quit date: 06/02/2014    Years since quitting: 8.8   Smokeless tobacco: Never  Vaping Use   Vaping status: Never Used  Substance and Sexual Activity   Alcohol use: No   Drug use: Not Currently    Types: Marijuana    Comment: none since a teenager  Sexual activity: Not Currently    Birth control/protection: Surgical  Other Topics Concern   Not on file  Social History Narrative   Not on file   Social Determinants of Health   Financial Resource Strain: Not on file  Food Insecurity: No Food Insecurity (02/28/2022)   Hunger Vital Sign    Worried About Running Out of Food in the Last Year: Never true    Ran Out of Food in the Last Year: Never true  Transportation Needs: No Transportation Needs (02/28/2022)   PRAPARE - Administrator, Civil Service (Medical): No    Lack of Transportation (Non-Medical): No  Physical Activity: Not on file  Stress: Not on file  Social Connections: Not on file  Intimate Partner Violence: Not on file    Past Surgical History:  Procedure Laterality Date   ABDOMINAL  HYSTERECTOMY     20s   BACK SURGERY     x4   BIOPSY  12/15/2022   Procedure: BIOPSY;  Surgeon: Lanelle Bal, DO;  Location: [facility];  Service: Endoscopy;;   BREAST SURGERY Bilateral    reduction   CHOLECYSTECTOMY     COLONOSCOPY WITH PROPOFOL N/A 12/15/2022   Procedure: COLONOSCOPY WITH PROPOFOL;  Surgeon: Lanelle Bal, DO;  Location: [facility];  Service: Endoscopy;  Laterality: N/A;  10:30 am, asa 3   ESOPHAGOGASTRODUODENOSCOPY (EGD) WITH PROPOFOL N/A 12/15/2022   Procedure: ESOPHAGOGASTRODUODENOSCOPY (EGD) WITH PROPOFOL;  Surgeon: Lanelle Bal, DO;  Location: [facility];  Service: Endoscopy;  Laterality: N/A;   LUMBAR LAMINECTOMY/DECOMPRESSION MICRODISCECTOMY N/A 06/30/2015   Procedure: wound exploration irrigation and debridement ,explantation of bone growth stimulator, placement of wound vac;  Surgeon: Loura Halt Ditty, MD;  Location: [facility];  Service: Neurosurgery;  Laterality: N/A;   REDUCTION MAMMAPLASTY Bilateral 2004      Current Outpatient Medications:    acyclovir (ZOVIRAX) 400 MG tablet, Take 1 tablet (400 mg total) by mouth 3 (three) times daily. For 5 days at first sign of cold sores flare, Disp: 30 tablet, Rfl: 0   albuterol (VENTOLIN HFA) 108 (90 Base) MCG/ACT inhaler, Inhale 1-2 puffs into the lungs every 6 (six) hours as needed for wheezing or shortness of breath., Disp: 1 each, Rfl: 0   Blood Glucose Monitoring Suppl DEVI, Check sugars up to 2 times daily. May substitute to any manufacturer covered by patient's insurance. E11.9, Disp: 1 each, Rfl: 0   cephALEXin (KEFLEX) 500 MG capsule, Take 1 capsule (500 mg total) by mouth 2 (two) times daily. (Patient taking differently: Take 1,000 mg by mouth daily.), Disp: 14 capsule, Rfl: 0   citalopram (CELEXA) 40 MG tablet, TAKE 1 Tablet BY MOUTH ONCE DAILY FOR MOOD, Disp: 90 tablet, Rfl: 3   Glucose Blood (BLOOD GLUCOSE TEST STRIPS) STRP, Check sugar up to 2 times daily. May substitute to any  manufacturer covered by patient's insurance. E11.9, Disp: 100 strip, Rfl: 3   Lancet Device MISC, Check sugar up to 2 times daily. May substitute to any manufacturer covered by patient's insurance. E11.9, Disp: 1 each, Rfl: 0   Lancets Misc. MISC, Check sugar up to 2 times daily. May substitute to any manufacturer covered by patient's insurance.E11.9, Disp: 100 each, Rfl: 3   lidocaine (ASPERCREME LIDOCAINE) 4 %, Place 1 patch onto the skin daily as needed (pain)., Disp: , Rfl:    methocarbamol (ROBAXIN) 750 MG tablet, TAKE 1 TABLET BY MOUTH EVERY 8 HOURS AS NEEDED FOR MUSCLE SPASMS, Disp:  90 tablet, Rfl: 1   metoprolol tartrate (LOPRESSOR) 50 MG tablet, TAKE 1 Tablet  BY MOUTH TWICE DAILY FOR HEART, Disp: 180 tablet, Rfl: 3   ondansetron (ZOFRAN-ODT) 4 MG disintegrating tablet, Take 1 tablet (4 mg total) by mouth every 8 (eight) hours as needed for nausea or vomiting., Disp: 20 tablet, Rfl: 0   pantoprazole (PROTONIX) 40 MG tablet, Take 1 tablet (40 mg total) by mouth 2 (two) times daily before a meal., Disp: 90 tablet, Rfl: 3   pregabalin (LYRICA) 50 MG capsule, Take 1 capsule (50 mg total) by mouth 2 (two) times daily. Place on file (Patient taking differently: Take 50 mg by mouth 2 (two) times daily as needed (pain). Place on file), Disp: 180 capsule, Rfl: 1   rosuvastatin (CRESTOR) 10 MG tablet, Take 1 tablet (10 mg total) by mouth daily. For cholesterol/ fatty liver, Disp: 90 tablet, Rfl: 3   SUMAtriptan (IMITREX) 50 MG tablet, Take 1 tablet (50 mg total) by mouth daily as needed for migraine. May repeat in 4 hours if headache persists or recurs., Disp: 6 tablet, Rfl: PRN   tirzepatide (MOUNJARO) 12.5 MG/0.5ML Pen, Inject 12.5 mg into the skin once a week., Disp: 6 mL, Rfl: 0    Physical Exam: Blood pressure 104/70, pulse 84, height 5\' 7"  (1.702 m), weight 273 lb (123.8 kg), last menstrual period 02/24/2013, SpO2 98%.    Affect appropriate Healthy:  appears stated age HEENT: normal Neck  supple with no adenopathy JVP normal no bruits no thyromegaly Lungs clear with no wheezing and good diaphragmatic motion Heart:  S1/S2 no murmur, no rub, gallop or click PMI normal Abdomen: benighn, BS positve, no tenderness, no AAA no bruit.  No HSM or HJR Distal pulses intact with no bruits No edema Neuro non-focal Skin warm and dry No muscular weakness   Labs:   Lab Results  Component Value Date   WBC 5.3 10/31/2022   HGB 13.6 10/31/2022   HCT 42.8 10/31/2022   MCV 88.2 10/31/2022   PLT 225 10/31/2022   No results for input(s): "NA", "K", "CL", "CO2", "BUN", "CREATININE", "CALCIUM", "PROT", "BILITOT", "ALKPHOS", "ALT", "AST", "GLUCOSE" in the last 168 hours.  Invalid input(s): "LABALBU" Lab Results  Component Value Date   TROPONINI <0.03 01/13/2016    Lab Results  Component Value Date   CHOL 141 03/23/2023   CHOL 156 11/18/2021   CHOL 165 07/26/2019   Lab Results  Component Value Date   HDL 48 03/23/2023   HDL 50 11/18/2021   HDL 59 07/26/2019   Lab Results  Component Value Date   LDLCALC 71 03/23/2023   LDLCALC 85 11/18/2021   LDLCALC 90 07/26/2019   Lab Results  Component Value Date   TRIG 122 03/23/2023   TRIG 103 11/18/2021   TRIG 87 07/26/2019   Lab Results  Component Value Date   CHOLHDL 2.9 03/23/2023   CHOLHDL 3.1 11/18/2021   CHOLHDL 2.8 07/26/2019   No results found for: "LDLDIRECT"    Radiology: No results found.  EKG: 07/26/19 SR rate 84 normal    ASSESSMENT AND PLAN:   1. Chest pain; atypical normal ECG CRF;s include Family history and HTN had normal ETT 09/26/19 but sub max exercise Discussed utility of coronary calcium score to further risk stratify  offered last visit as well    2.  HTN:  Well controlled.  Continue current medications and low sodium Dash type diet.   Continue beta blocker   3.  Anxiety: continue  Celexa f/u primary   4.  Palpitations :  Monitor with no significant arrhythmia on beta blocker  Toprol to 50 mg  daily    5. Pre syncope: likely vagal see above no high risk family history , exam or ECG   6. Dyspnea:  seems functional from deconditioning and obesity 09/01/20 CTA with no PE and Rx for ? Early Pneumonia with Z pack Lungs normal on exam today. Has had normal echo 09/26/19 observe   7. Edema:  dependant from obesity and varicosities f/u primary can consider PRN diuretic   F/U in a year  Coronary calcium score   Signed: Charlton Haws 04/13/2023, 10:05 AM

## 2023-04-13 ENCOUNTER — Encounter: Payer: Self-pay | Admitting: Cardiovascular Disease

## 2023-04-13 ENCOUNTER — Ambulatory Visit: Payer: Medicaid Other | Attending: Cardiovascular Disease | Admitting: Cardiovascular Disease

## 2023-04-13 VITALS — BP 104/70 | HR 84 | Ht 67.0 in | Wt 273.0 lb

## 2023-04-13 DIAGNOSIS — R079 Chest pain, unspecified: Secondary | ICD-10-CM

## 2023-04-13 DIAGNOSIS — I10 Essential (primary) hypertension: Secondary | ICD-10-CM | POA: Diagnosis not present

## 2023-04-13 DIAGNOSIS — Z8249 Family history of ischemic heart disease and other diseases of the circulatory system: Secondary | ICD-10-CM

## 2023-04-13 NOTE — Patient Instructions (Signed)
Medication Instructions:  Your physician recommends that you continue on your current medications as directed. Please refer to the Current Medication list given to you today.  *If you need a refill on your cardiac medications before your next appointment, please call your pharmacy*   Lab Work: NONE   If you have labs (blood work) drawn today and your tests are completely normal, you will receive your results only by: MyChart Message (if you have MyChart) OR A paper copy in the mail If you have any lab test that is abnormal or we need to change your treatment, we will call you to review the results.   Testing/Procedures: Calcium Score CT    Follow-Up: At [facility], you and your health needs are our priority.  As part of our continuing mission to provide you with exceptional heart care, we have created designated Provider Care Teams.  These Care Teams include your primary Cardiologist (physician) and Advanced Practice Providers (APPs -  Physician Assistants and Nurse Practitioners) who all work together to provide you with the care you need, when you need it.  We recommend signing up for the patient portal called "MyChart".  Sign up information is provided on this After Visit Summary.  MyChart is used to connect with patients for Virtual Visits (Telemedicine).  Patients are able to view lab/test results, encounter notes, upcoming appointments, etc.  Non-urgent messages can be sent to your provider as well.   To learn more about what you can do with MyChart, go to [email or web address].    Your next appointment:   1 year(s)  Provider:   You may see Charlton Haws, MD or one of the following Advanced Practice Providers on your designated Care Team:   Randall An, PA-C  Jacolyn Reedy, PA-C     Other Instructions Thank you for choosing [facility]!

## 2023-04-20 ENCOUNTER — Ambulatory Visit: Payer: Medicaid Other | Admitting: Urology

## 2023-04-27 ENCOUNTER — Encounter: Payer: Self-pay | Admitting: Family Medicine

## 2023-04-27 ENCOUNTER — Ambulatory Visit: Payer: Medicaid Other | Admitting: Family Medicine

## 2023-04-27 VITALS — BP 115/83 | HR 77 | Temp 98.7°F | Ht 67.0 in | Wt 275.4 lb

## 2023-04-27 DIAGNOSIS — R300 Dysuria: Secondary | ICD-10-CM | POA: Diagnosis not present

## 2023-04-27 DIAGNOSIS — N3000 Acute cystitis without hematuria: Secondary | ICD-10-CM

## 2023-04-27 LAB — URINALYSIS, ROUTINE W REFLEX MICROSCOPIC
Bilirubin, UA: NEGATIVE
Glucose, UA: NEGATIVE
Nitrite, UA: NEGATIVE
Specific Gravity, UA: 1.025 (ref 1.005–1.030)
Urobilinogen, Ur: 0.2 mg/dL (ref 0.2–1.0)
pH, UA: 7 (ref 5.0–7.5)

## 2023-04-27 LAB — MICROSCOPIC EXAMINATION
Renal Epithel, UA: NONE SEEN /[HPF]
WBC, UA: >30 /[HPF] — AB (ref 0–5)
Yeast, UA: NONE SEEN

## 2023-04-27 MED ORDER — CEPHALEXIN 500 MG PO CAPS
500.0000 mg | ORAL_CAPSULE | Freq: Two times a day (BID) | ORAL | 0 refills | Status: DC
Start: 2023-04-27 — End: 2023-05-11

## 2023-04-27 MED ORDER — PHENAZOPYRIDINE HCL 100 MG PO TABS: 100.0000 mg | ORAL_TABLET | Freq: Three times a day (TID) | ORAL | 0 refills | Status: DC | PRN

## 2023-04-27 NOTE — Progress Notes (Signed)
   Acute Office Visit  Subjective:     Patient ID: Cheryl Randall, female    DOB: [birth year], 47 y.o.   MRN: [id number]  Chief Complaint  Patient presents with   Dysuria    Dysuria  This is a new problem. The current episode started yesterday. The problem occurs every urination. The problem has been rapidly worsening. The pain is moderate. There has been no fever. Associated symptoms include frequency and urgency. Pertinent negatives include no chills, discharge, flank pain, hematuria, nausea or vomiting. Her past medical history is significant for recurrent UTIs.    Review of Systems  Constitutional:  Negative for chills.  Gastrointestinal:  Negative for nausea and vomiting.  Genitourinary:  Positive for dysuria, frequency and urgency. Negative for flank pain and hematuria.        Objective:    BP 115/83   Pulse 77   Temp 98.7 F (37.1 C) (Temporal)   Ht 5\' 7"  (1.702 m)   Wt 275 lb 6 oz (124.9 kg)   LMP 02/24/2013   SpO2 95%   BMI 43.13 kg/m    Physical Exam Vitals and nursing note reviewed.  Constitutional:      General: She is not in acute distress.    Appearance: She is obese. She is not ill-appearing, toxic-appearing or diaphoretic.  Cardiovascular:     Rate and Rhythm: Normal rate and regular rhythm.     Heart sounds: Normal heart sounds. No murmur heard. Pulmonary:     Effort: Pulmonary effort is normal. No respiratory distress.     Breath sounds: Normal breath sounds. No wheezing.  Abdominal:     Palpations: Abdomen is soft.     Tenderness: There is abdominal tenderness in the suprapubic area. There is no right CVA tenderness, left CVA tenderness, guarding or rebound.  Skin:    General: Skin is warm and dry.  Neurological:     General: No focal deficit present.     Mental Status: She is alert and oriented to person, place, and time.  Psychiatric:        Mood and Affect: Mood normal.     Urine dipstick shows positive for RBC's, positive for protein,  positive for leukocytes, and positive for ketones.  Micro exam: >30 WBC's per HPF, 0-2 RBC's per HPF, and moderate + bacteria.       Assessment & Plan:   Cheryl Randall was seen today for dysuria.  Diagnoses and all orders for this visit:  Acute cystitis without hematuria Keflex as below pending urine culture. Schedule follow up with urology. Return to office for new or worsening symptoms, or if symptoms persist.  -     Urinalysis, Routine w reflex microscopic -     Urine Culture -     cephALEXin (KEFLEX) 500 MG capsule; Take 1 capsule (500 mg total) by mouth 2 (two) times daily. -     phenazopyridine (PYRIDIUM) 100 MG tablet; Take 1 tablet (100 mg total) by mouth 3 (three) times daily as needed for pain (urinary pain).   The patient indicates understanding of these issues and agrees with the plan.  Gabriel Earing, FNP

## 2023-04-30 LAB — URINE CULTURE

## 2023-05-05 ENCOUNTER — Other Ambulatory Visit: Payer: Self-pay | Admitting: Family Medicine

## 2023-05-11 ENCOUNTER — Encounter: Payer: Self-pay | Admitting: Nurse Practitioner

## 2023-05-11 ENCOUNTER — Ambulatory Visit: Payer: Medicaid Other | Admitting: Nurse Practitioner

## 2023-05-11 VITALS — BP 117/80 | HR 83 | Temp 97.6°F | Ht 67.0 in | Wt 272.2 lb

## 2023-05-11 DIAGNOSIS — R0981 Nasal congestion: Secondary | ICD-10-CM | POA: Diagnosis not present

## 2023-05-11 DIAGNOSIS — H66001 Acute suppurative otitis media without spontaneous rupture of ear drum, right ear: Secondary | ICD-10-CM

## 2023-05-11 DIAGNOSIS — J029 Acute pharyngitis, unspecified: Secondary | ICD-10-CM | POA: Diagnosis not present

## 2023-05-11 DIAGNOSIS — R051 Acute cough: Secondary | ICD-10-CM | POA: Diagnosis not present

## 2023-05-11 LAB — VERITOR FLU A/B WAIVED
Influenza A: NEGATIVE
Influenza B: NEGATIVE

## 2023-05-11 LAB — RAPID STREP SCREEN (MED CTR MEBANE ONLY): Strep Gp A Ag, IA W/Reflex: NEGATIVE

## 2023-05-11 LAB — CULTURE, GROUP A STREP

## 2023-05-11 MED ORDER — AZELASTINE HCL 0.1 % NA SOLN
1.0000 | Freq: Two times a day (BID) | NASAL | 12 refills | Status: AC
Start: 2023-05-11 — End: ?

## 2023-05-11 MED ORDER — PROMETHAZINE-DM 6.25-15 MG/5ML PO SYRP
5.0000 mL | ORAL_SOLUTION | Freq: Two times a day (BID) | ORAL | 0 refills | Status: DC | PRN
Start: 2023-05-11 — End: 2023-06-15

## 2023-05-11 MED ORDER — AMOXICILLIN 875 MG PO TABS
875.0000 mg | ORAL_TABLET | Freq: Two times a day (BID) | ORAL | 0 refills | Status: AC
Start: 2023-05-11 — End: 2023-05-21

## 2023-05-11 NOTE — Progress Notes (Signed)
Acute Office Visit  Subjective:     Patient ID: Cheryl Randall, female    DOB: 28-Dec-1975, 47 y.o.   MRN: [id number]  Chief Complaint  Patient presents with   Cough    Symptoms started 2 days ago   Sore Throat   Nasal Congestion    HPI Cheryl Randall is a 47 y.o. female present 05/11/2023 who complains of congestion, sore throat, ear pain and dry cough for 2 days. She denies a history of anorexia, chest pain, chills, dizziness, fevers, and myalgias Ear pain  Otitis Media: Patient presents with ear pain. Cheryl Randall has had approximately infrequent episodes of otitis media in the past 1 year. The infections typically affect the right ear and are typically manifested by ear pain, congestion, runny nose, sore throat, cough.  Prior antibiotic therapy has included none. Middle ear fluid has been persistent for 2 days.  Her nasal symptoms consist of nasal congestion, cough, itchy nose.   Cough: Patient complains of nasal congestion, nonproductive cough, and sore throat.  Symptoms began 4 days ago.  The cough is non-productive, without wheezing, dyspnea or hemoptysis and is aggravated by nothing Associated symptoms include:change in voice and postnasal drip. Patient does not have new pets. Patient does not have a history of asthma. Patient does not have a history of environmental allergens. Patient does not have recent travel.   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   URI with cough and congestion 03/26/2021   Upper airway cough syndrome 05/30/2021   Morbid obesity due to excess calories (HCC) 05/30/2021   Resolved Ambulatory Problems    Diagnosis Date Noted   Wound infection 06/30/2015   Staphylococcus aureus bacteremia with sepsis Cheryl Randall)    Dysuria 05/03/2021   Past Medical History:  Diagnosis Date   Anxiety    DDD (degenerative disc disease)    Diabetes mellitus without complication (HCC)    Dysrhythmia    Family history of adverse reaction to anesthesia    GERD (gastroesophageal reflux disease)    Migraine    PONV (postoperative nausea and vomiting)     Review of Systems  Constitutional:  Negative for chills and fever.  HENT:  Positive for congestion, ear pain, sinus pain and sore throat.   Respiratory:  Positive for cough. Negative for shortness of breath and wheezing.   Cardiovascular:  Negative for chest pain and leg swelling.  Gastrointestinal:  Negative for constipation, diarrhea, nausea and vomiting.  Skin:  Negative for itching and rash.  Neurological:  Negative for dizziness, weakness and headaches.  Endo/Heme/Allergies:  Negative for environmental allergies and polydipsia. Does not bruise/bleed easily.  Psychiatric/Behavioral:  Negative for suicidal ideas. The patient does not have insomnia.    Negative unless indicated in HPI    Objective:    BP 117/80   Pulse 83   Temp 97.6 F (36.4 C) (Temporal)   Ht 5\' 7"  (1.702 m)   Wt 272 lb  3.2 oz (123.5 kg)   LMP 02/24/2013   SpO2 95%   BMI 42.63 kg/m  BP Readings from Last 3 Encounters:  05/11/23 117/80  04/27/23 115/83  04/13/23 104/70   Wt Readings from Last 3 Encounters:  05/11/23 272 lb 3.2 oz (123.5 kg)  04/27/23 275 lb 6 oz (124.9 kg)  04/13/23 273 lb (123.8 kg)      Physical Exam Vitals and nursing note reviewed.  Constitutional:      Appearance: She is well-developed. She is obese.  HENT:     Head: Normocephalic  and atraumatic.     Right Ear: Tenderness present. Tympanic membrane is erythematous.     Left Ear: Tympanic membrane and ear canal normal. No swelling or tenderness.     Nose: Congestion present. No rhinorrhea.     Mouth/Throat:     Mouth: Mucous membranes are moist.     Pharynx: Oropharynx is clear.  Eyes:     Conjunctiva/sclera: Conjunctivae normal.  Cardiovascular:     Rate and Rhythm: Normal rate and regular rhythm.  Pulmonary:     Effort: Pulmonary effort is normal. No respiratory distress.     Breath sounds: Normal breath sounds. No wheezing.  Musculoskeletal:        General: Normal range of motion.     Right lower leg: No edema.     Left lower leg: No edema.  Skin:    General: Skin is warm and dry.     Findings: No rash.  Neurological:     Mental Status: She is alert and oriented to person, place, and time. Mental status is at baseline.  Psychiatric:        Mood and Affect: Mood normal.        Behavior: Behavior normal.    Results for orders placed or performed in visit on 05/11/23  Rapid Strep Screen ([facility] ONLY)   Specimen: Other   Other  Result Value Ref Range   Strep Gp A Ag, IA W/Reflex Negative Negative  Culture, Group A Strep   Other  Result Value Ref Range   Strep A Culture CANCELED   Veritor Flu A/B Waived  Result Value Ref Range   Influenza A Negative Negative   Influenza B Negative Negative        Assessment & Plan:  Non-recurrent acute suppurative otitis media of right ear without spontaneous rupture of tympanic membrane -     Amoxicillin; Take 1 tablet (875 mg total) by mouth 2 (two) times daily for 10 days.  Dispense: 20 tablet; Refill: 0  Acute cough -     Novel Coronavirus, NAA ([facility]) -     Veritor Flu A/B Waived -     Rapid Strep Screen ([facility] ONLY) -     Promethazine-DM; Take 5 mLs by mouth 2 (two) times daily as needed for cough.  Dispense: 118 mL; Refill: 0  Sore throat -     Novel Coronavirus, NAA ([facility]) -      Veritor Flu A/B Waived -     Rapid Strep Screen ([facility] ONLY) -     Promethazine-DM; Take 5 mLs by mouth 2 (two) times daily as needed for cough.  Dispense: 118 mL; Refill: 0  Nasal congestion -     Novel Coronavirus, NAA ([facility]) -     Veritor Flu A/B Waived -     Rapid Strep Screen ([facility] ONLY) -     Promethazine-DM; Take 5 mLs  by mouth 2 (two) times daily as needed for cough.  Dispense: 118 mL; Refill: 0 -     Azelastine HCl; Place 1 spray into both nostrils 2 (two) times daily. Use in each nostril as directed  Dispense: 30 mL; Refill: 12  Other orders -     Culture, Group A Strep  Lashauna a 47 year old Caucasian female seen today for otitis media, no acute distress Otitis media: Amoxicillin 875 #10 dispense twice daily for 5 days Cough:promethazine-dextromethorphan Congestion: Astelin 1 spray daily Call the clinic if symptoms worsen COVID and Strep culture pending, Encourage healthy lifestyle choices, including diet (rich in fruits, vegetables, and lean proteins, and low in salt and simple carbohydrates) and exercise (at least 30 minutes of moderate physical activity daily).     The above assessment and management plan was discussed with the patient. The patient verbalized understanding of and has agreed to the management plan. Patient is aware to call the clinic if they develop any new symptoms or if symptoms persist or worsen. Patient is aware when to return to the clinic for a follow-up visit. Patient educated on when it is appropriate to go to the emergency department.  Return if symptoms worsen or fail to improve.  Arrie Aran Santa Lighter, [facility] [address] [place], [place] [address] [phone]  Note: This document was prepared by [employer] voice dictation technology and any errors that results from this process are unintentional.

## 2023-05-12 LAB — NOVEL CORONAVIRUS, NAA: SARS-CoV-2, NAA: NOT DETECTED

## 2023-05-14 ENCOUNTER — Other Ambulatory Visit (HOSPITAL_COMMUNITY): Payer: Medicaid Other

## 2023-05-18 ENCOUNTER — Other Ambulatory Visit: Payer: Self-pay | Admitting: Family Medicine

## 2023-05-18 DIAGNOSIS — F418 Other specified anxiety disorders: Secondary | ICD-10-CM

## 2023-05-18 DIAGNOSIS — M5416 Radiculopathy, lumbar region: Secondary | ICD-10-CM

## 2023-05-18 DIAGNOSIS — G894 Chronic pain syndrome: Secondary | ICD-10-CM

## 2023-05-18 DIAGNOSIS — M549 Dorsalgia, unspecified: Secondary | ICD-10-CM

## 2023-05-26 ENCOUNTER — Other Ambulatory Visit: Payer: Self-pay | Admitting: Family Medicine

## 2023-05-26 DIAGNOSIS — E119 Type 2 diabetes mellitus without complications: Secondary | ICD-10-CM

## 2023-06-04 ENCOUNTER — Other Ambulatory Visit: Payer: Self-pay | Admitting: Family Medicine

## 2023-06-04 DIAGNOSIS — N3000 Acute cystitis without hematuria: Secondary | ICD-10-CM

## 2023-06-05 ENCOUNTER — Encounter: Payer: Self-pay | Admitting: Family Medicine

## 2023-06-05 ENCOUNTER — Ambulatory Visit: Payer: Medicaid Other | Admitting: Family Medicine

## 2023-06-05 VITALS — BP 109/82 | HR 80 | Temp 97.2°F | Ht 67.0 in | Wt 272.0 lb

## 2023-06-05 DIAGNOSIS — R300 Dysuria: Secondary | ICD-10-CM

## 2023-06-05 DIAGNOSIS — N390 Urinary tract infection, site not specified: Secondary | ICD-10-CM | POA: Diagnosis not present

## 2023-06-05 LAB — URINALYSIS, ROUTINE W REFLEX MICROSCOPIC
Bilirubin, UA: NEGATIVE
Glucose, UA: NEGATIVE
Ketones, UA: NEGATIVE
Leukocytes,UA: NEGATIVE
Nitrite, UA: NEGATIVE
Protein,UA: NEGATIVE
RBC, UA: NEGATIVE
Specific Gravity, UA: 1.025 (ref 1.005–1.030)
Urobilinogen, Ur: 1 mg/dL (ref 0.2–1.0)
pH, UA: 6 (ref 5.0–7.5)

## 2023-06-05 MED ORDER — SULFAMETHOXAZOLE-TRIMETHOPRIM 800-160 MG PO TABS: 1.0000 | ORAL_TABLET | Freq: Two times a day (BID) | ORAL | 0 refills | Status: AC

## 2023-06-05 NOTE — Progress Notes (Signed)
   Acute Office Visit  Subjective:     Patient ID: Cheryl Randall, female    DOB: 1975-12-20, 48 y.o.   MRN: [id number]  Chief Complaint  Patient presents with   Dysuria    Dysuria  This is a new problem. Episode onset: 2 days. The problem occurs every urination. The quality of the pain is described as burning. There has been no fever. Associated symptoms include frequency, hematuria, nausea and urgency. Pertinent negatives include no chills, discharge, flank pain or vomiting. She has tried antibiotics (keflex  x 2 days) for the symptoms. The treatment provided moderate relief. Her past medical history is significant for recurrent UTIs.     Review of Systems  Constitutional:  Negative for chills.  Gastrointestinal:  Positive for nausea. Negative for vomiting.  Genitourinary:  Positive for dysuria, frequency, hematuria and urgency. Negative for flank pain.        Objective:    BP 109/82   Pulse 80   Temp (!) 97.2 F (36.2 C) (Temporal)   Ht 5' 7 (1.702 m)   Wt 272 lb (123.4 kg)   LMP 02/24/2013   SpO2 97%   BMI 42.60 kg/m    Physical Exam Vitals and nursing note reviewed.  Constitutional:      General: She is not in acute distress.    Appearance: She is not ill-appearing, toxic-appearing or diaphoretic.  Cardiovascular:     Rate and Rhythm: Normal rate and regular rhythm.     Heart sounds: Normal heart sounds. No murmur heard. Pulmonary:     Effort: Pulmonary effort is normal. No respiratory distress.  Abdominal:     General: Bowel sounds are normal. There is no distension.     Palpations: Abdomen is soft.     Tenderness: There is abdominal tenderness in the suprapubic area. There is no right CVA tenderness, left CVA tenderness, guarding or rebound.  Skin:    General: Skin is warm and dry.  Neurological:     General: No focal deficit present.     Mental Status: She is alert and oriented to person, place, and time.  Psychiatric:        Mood and Affect: Mood  normal.        Behavior: Behavior normal.     No results found for any visits on 06/05/23.      Assessment & Plan:   Cheryl Randall was seen today for dysuria.  Diagnoses and all orders for this visit:  Dysuria -     Urinalysis, Routine w reflex microscopic -     Urine Culture -     sulfamethoxazole -trimethoprim  (BACTRIM  DS) 800-160 MG tablet; Take 1 tablet by mouth 2 (two) times daily for 7 days.  Recurrent UTI -     Urine Culture -     sulfamethoxazole -trimethoprim  (BACTRIM  DS) 800-160 MG tablet; Take 1 tablet by mouth 2 (two) times daily for 7 days.   UA negative today, she has had 4 doses of keflex  from a left over prescription however. Hx of recurrent UTIs. Last treated with keflex  6 weeks ago. Will treat with bactrim  based off last urine culture results pending current culture. Push fluids. Follow up with urology. Return to office for new or worsening symptoms, or if symptoms persist.   The patient indicates understanding of these issues and agrees with the plan.  Cheryl CHRISTELLA Search, FNP

## 2023-06-07 LAB — URINE CULTURE

## 2023-06-15 ENCOUNTER — Ambulatory Visit: Payer: Medicaid Other | Admitting: Family Medicine

## 2023-06-15 ENCOUNTER — Encounter: Payer: Self-pay | Admitting: Family Medicine

## 2023-06-15 VITALS — BP 102/71 | HR 86 | Temp 98.4°F | Ht 67.0 in | Wt 270.4 lb

## 2023-06-15 DIAGNOSIS — F418 Other specified anxiety disorders: Secondary | ICD-10-CM | POA: Diagnosis not present

## 2023-06-15 DIAGNOSIS — I152 Hypertension secondary to endocrine disorders: Secondary | ICD-10-CM

## 2023-06-15 DIAGNOSIS — B001 Herpesviral vesicular dermatitis: Secondary | ICD-10-CM

## 2023-06-15 DIAGNOSIS — E1159 Type 2 diabetes mellitus with other circulatory complications: Secondary | ICD-10-CM | POA: Diagnosis not present

## 2023-06-15 DIAGNOSIS — E119 Type 2 diabetes mellitus without complications: Secondary | ICD-10-CM | POA: Diagnosis not present

## 2023-06-15 DIAGNOSIS — K760 Fatty (change of) liver, not elsewhere classified: Secondary | ICD-10-CM

## 2023-06-15 DIAGNOSIS — Z6379 Other stressful life events affecting family and household: Secondary | ICD-10-CM | POA: Diagnosis not present

## 2023-06-15 DIAGNOSIS — Z7985 Long-term (current) use of injectable non-insulin antidiabetic drugs: Secondary | ICD-10-CM | POA: Diagnosis not present

## 2023-06-15 LAB — BAYER DCA HB A1C WAIVED: HB A1C (BAYER DCA - WAIVED): 5.1 % (ref 4.8–5.6)

## 2023-06-15 MED ORDER — VALACYCLOVIR HCL 1 G PO TABS: 2000.0000 mg | ORAL_TABLET | Freq: Two times a day (BID) | ORAL | 0 refills | Status: DC

## 2023-06-15 MED ORDER — TIRZEPATIDE 15 MG/0.5ML ~~LOC~~ SOAJ: 15.0000 mg | SUBCUTANEOUS | 3 refills | Status: DC

## 2023-06-15 MED ORDER — ROSUVASTATIN CALCIUM 10 MG PO TABS: 10.0000 mg | ORAL_TABLET | Freq: Every day | ORAL | 3 refills | Status: DC

## 2023-06-15 MED ORDER — CITALOPRAM HYDROBROMIDE 40 MG PO TABS: 40.0000 mg | ORAL_TABLET | Freq: Every day | ORAL | 3 refills | Status: DC

## 2023-06-15 MED ORDER — METOPROLOL TARTRATE 50 MG PO TABS: ORAL_TABLET | ORAL | 3 refills | Status: DC

## 2023-06-15 NOTE — Progress Notes (Signed)
 Subjective: CC:DM PCP: [name], DO YEP:Cheryl Randall is a 48 y.o. female presenting to clinic today for:  1. Type 2 Diabetes with hypertension, hyperlipidemia:  Patient reports, plans with Mounjaro , Crestor , metoprolol .  She is actually been taking to the metoprolol  at bedtime rather than twice daily 50 mg dosing.  She denies any chest pain, shortness of breath, dizziness.  She has not had recurrent UTI but admits that she is not drinking well.  Her appetite is suppressed on the medication and so is her thirst. Diabetes Health Maintenance Due  Topic Date Due   FOOT EXAM  Never done   OPHTHALMOLOGY EXAM  Never done   HEMOGLOBIN A1C  09/21/2023    Last A1c:  Lab Results  Component Value Date   HGBA1C 5.4 03/23/2023   2.  Fever blister She reports that she had a vesicular formation on the right lower lip.  She has been taking acyclovir  3 times daily but this does not resolve the issue.  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   Deep incisional surgical site infection 07/01/2015   Diabetes mellitus without complication (HCC)    borderline; diet controlled   Dysrhythmia    Essential hypertension 07/26/2019   Family history of adverse reaction to anesthesia    Patients son has bad N/V   GAD (generalized anxiety disorder) 06/26/2017   Generalized edema 03/24/2017   GERD (gastroesophageal reflux disease)    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   Migraine    Palpitations 07/26/2019   Panic attack 06/26/2017   PONV (postoperative nausea and vomiting)    Pseudoarthrosis of lumbar spine  05/31/2015   Staphylococcus aureus bacteremia with sepsis (HCC)    Wound infection 06/30/2015    Current Outpatient Medications:    acyclovir  (ZOVIRAX ) 400 MG tablet, TAKE ONE TABLET BY MOUTH THREE TIMES DAILY AS NEEDED, Disp: 30 tablet, Rfl: 1   albuterol  (VENTOLIN  HFA) 108 (90 Base) MCG/ACT inhaler, Inhale 1-2 puffs into the lungs every 6 (six) hours as needed for wheezing or shortness of breath., Disp: 1 each, Rfl: 0   azelastine  (ASTELIN ) 0.1 % nasal spray, Place 1 spray into both nostrils 2 (two) times daily. Use in each nostril as directed, Disp: 30 mL, Rfl: 12   Blood Glucose Monitoring Suppl DEVI, Check sugars up to 2 times daily. May substitute to any manufacturer covered by patient's insurance. E11.9, Disp: 1 each, Rfl: 0   citalopram  (CELEXA ) 40 MG tablet, TAKE ONE (1) TABLET BY MOUTH EVERY DAY, Disp: 90 tablet, Rfl: 0   Glucose Blood (BLOOD GLUCOSE TEST STRIPS) STRP, Check sugar up to 2 times daily. May substitute to any manufacturer covered by patient's insurance. E11.9, Disp: 100 strip, Rfl: 3   Lancet Device MISC, Check sugar up to 2 times daily. May substitute to any manufacturer covered by patient's insurance. E11.9, Disp: 1 each, Rfl: 0   Lancets Misc. MISC, Check sugar up to 2 times daily. May substitute to any manufacturer covered by patient's insurance.E11.9, Disp: 100 each, Rfl: 3   lidocaine  (ASPERCREME LIDOCAINE ) 4 %, Place 1 patch onto  the skin daily as needed (pain)., Disp: , Rfl:    methocarbamol  (ROBAXIN ) 750 MG tablet, TAKE 1 TABLET BY MOUTH EVERY 8 HOURS AS NEEDED FOR MUSCLE SPASMS, Disp: 90 tablet, Rfl: 0   metoprolol  tartrate (LOPRESSOR ) 50 MG tablet, TAKE ONE (1) TABLET BY MOUTH TWO (2) TIMES DAILY, Disp: 180 tablet, Rfl: 0   MOUNJARO  12.5 MG/0.5ML Pen, INJECT 12.5MG  INTO THE SKIN ONCE A WEEK, Disp: 6 mL, Rfl: 0   ondansetron  (ZOFRAN -ODT) 4 MG disintegrating tablet, Take 1 tablet (4 mg total) by mouth every 8 (eight) hours as needed for nausea or vomiting., Disp: 20  tablet, Rfl: 0   pantoprazole  (PROTONIX ) 40 MG tablet, Take 1 tablet (40 mg total) by mouth 2 (two) times daily before a meal., Disp: 90 tablet, Rfl: 3   rosuvastatin  (CRESTOR ) 10 MG tablet, Take 1 tablet (10 mg total) by mouth daily. For cholesterol/ fatty liver, Disp: 90 tablet, Rfl: 3   SUMAtriptan  (IMITREX ) 50 MG tablet, Take 1 tablet (50 mg total) by mouth daily as needed for migraine. May repeat in 4 hours if headache persists or recurs., Disp: 6 tablet, Rfl: PRN Social History   Socioeconomic History   Marital status: Divorced    Spouse name: Not on file   Number of children: 1   Years of education: Not on file   Highest education level: Some college, no degree  Occupational History   Not on file  Tobacco Use   Smoking status: Former    Current packs/day: 0.00    Types: Cigarettes    Quit date: 06/02/2014    Years since quitting: 9.0   Smokeless tobacco: Never  Vaping Use   Vaping status: Never Used  Substance and Sexual Activity   Alcohol use: No   Drug use: Not Currently    Types: Marijuana    Comment: none since a teenager   Sexual activity: Not Currently    Birth control/protection: Surgical  Other Topics Concern   Not on file  Social History Narrative   Not on file   Social Drivers of Health   Financial Resource Strain: Not on file  Food Insecurity: No Food Insecurity (02/28/2022)   Hunger Vital Sign    Worried About Running Out of Food in the Last Year: Never true    Ran Out of Food in the Last Year: Never true  Transportation Needs: No Transportation Needs (02/28/2022)   PRAPARE - Administrator, Civil Service (Medical): No    Lack of Transportation (Non-Medical): No  Physical Activity: Not on file  Stress: Not on file  Social Connections: Not on file  Intimate Partner Violence: Not on file   Family History  Problem Relation Age of Onset   Heart disease Mother    Diabetes Mother    Cancer Father        bladder   Autism Brother    Diabetes  Brother    Heart disease Brother    Diabetes Brother    Diabetes Brother    Breast cancer Maternal Aunt        double mastectomy    Objective: Office vital signs reviewed. BP 102/71   Pulse 86   Temp 98.4 F (36.9 C)   Ht 5' 7 (1.702 m)   Wt 270 lb 6.4 oz (122.7 kg)   LMP 02/24/2013   SpO2 96%   BMI 42.35 kg/m   Physical Examination:  General: Awake, alert, well appearing female, No acute distress HEENT: sclera white, MMM  Cardio: regular rate and rhythm, S1S2 heard, no murmurs appreciated Pulm: clear to auscultation bilaterally, no wheezes, rhonchi or rales; normal work of breathing on room air Skin: Ruptured fever blister appreciated on the right lower lip     06/15/2023    2:20 PM 04/27/2023   10:51 AM 11/07/2022    1:05 PM  Depression screen PHQ 2/9  Decreased Interest 0 0 0  Down, Depressed, Hopeless 0 0 0  PHQ - 2 Score 0 0 0  Altered sleeping 0 0 0  Tired, decreased energy 0 0 0  Change in appetite 0 0 0  Feeling bad or failure about yourself  0 0 0  Trouble concentrating 0 0 0  Moving slowly or fidgety/restless 0 0 0  Suicidal thoughts 0 0 0  PHQ-9 Score 0 0 0  Difficult doing work/chores Not difficult at all Not difficult at all Not difficult at all      06/15/2023    2:19 PM 04/27/2023   10:51 AM 11/07/2022    1:05 PM 10/16/2022    3:41 PM  GAD 7 : Generalized Anxiety Score  Nervous, Anxious, on Edge 0 0 0 0  Control/stop worrying 0 0 0 0  Worry too much - different things 0 0 0 0  Trouble relaxing 0 0 0 0  Restless 0 0 0 0  Easily annoyed or irritable 0 0 0 0  Afraid - awful might happen 0 0 0 0  Total GAD 7 Score 0 0 0 0  Anxiety Difficulty Not difficult at all Not difficult at all Not difficult at all Not difficult at all    Assessment/ Plan: 48 y.o. female   Diabetes mellitus treated with injections of non-insulin medication (HCC) - Plan: Bayer DCA Hb A1c Waived, tirzepatide  (MOUNJARO ) 15 MG/0.5ML Pen  Hypertension associated with diabetes  (HCC) - Plan: tirzepatide  (MOUNJARO ) 15 MG/0.5ML Pen  Fatty liver - Plan: tirzepatide  (MOUNJARO ) 15 MG/0.5ML Pen, rosuvastatin  (CRESTOR ) 10 MG tablet  Morbid obesity (HCC) - Plan: tirzepatide  (MOUNJARO ) 15 MG/0.5ML Pen  Fever blister - Plan: valACYclovir  (VALTREX ) 1000 MG tablet  Depression with anxiety - Plan: citalopram  (CELEXA ) 40 MG tablet  Stress due to illness of family member   Advance Mounjaro  to 15 mg daily.  Sugar well-controlled.  She is down 39 pounds.  Still working towards weight goals of at least another 30 pounds as she remains in morbid obese BMI and is at increased risk of cardiovascular events given history of hyperlipidemia, fatty liver and hypertension  Blood pressure is controlled.  Advised that she should be splitting the metoprolol  to twice daily dosing rather than nightly dosing.  Glad to switch this to an extended release for convenience if needed  Will place her on Valtrex  2 g twice daily for 1 day in efforts to eradicate fever blister.  Celexa  renewed.  Having a slight exacerbation of stress due to illness of her mother who has had some declining health over the last year  Cheryl Rinck M Marcelle Bebout, DO [facility] [phone]

## 2023-06-15 NOTE — Patient Instructions (Signed)
DRINK MORE WATER!

## 2023-06-17 ENCOUNTER — Encounter: Payer: Medicaid Other | Attending: Family Medicine | Admitting: Nutrition

## 2023-06-17 ENCOUNTER — Encounter: Payer: Self-pay | Admitting: Nutrition

## 2023-06-17 VITALS — Ht 67.0 in | Wt 269.0 lb

## 2023-06-17 DIAGNOSIS — Z6841 Body Mass Index (BMI) 40.0 and over, adult: Secondary | ICD-10-CM | POA: Diagnosis present

## 2023-06-17 DIAGNOSIS — I10 Essential (primary) hypertension: Secondary | ICD-10-CM | POA: Diagnosis present

## 2023-06-17 DIAGNOSIS — E118 Type 2 diabetes mellitus with unspecified complications: Secondary | ICD-10-CM | POA: Diagnosis present

## 2023-06-17 NOTE — Patient Instructions (Signed)
 Goals  Increase protein rich foods with meals Increase water  to 80 oz of water  per day

## 2023-06-17 NOTE — Progress Notes (Signed)
 Medical Nutrition Therapy  Appointment Start time:  1100 Appointment End time:  1115 Primary concerns today: Obesity, DM Type 2  Referral diagnosis: E11.8,E66.01  Preferred learning style: See , visuals.   Learning readiness: Ready  NUTRITION ASSESSMENT Follow up A1C 5.1% Lost 9 lbs. Eating more fruits and vegetables. Still drinking soda and sweet tea. Walking a little more. Still has her back issues that limit her from exercising regularly or walking long distance. On Mounjero now 15 mg now as of yesterday instead of Ozempic . Go to water  aerobics 2 twice a week -- has started walking some but not doing water  aerobics Cut out veggies straws and choose more fruit and veggies-Done.  Discussed her goal weights. She doesn't want to get down to 200 lbs. She thinks that would make her look too skinny. She notes she wants to lose about 30 more lbs.  Lab Results  Component Value Date   HGBA1C 5.1 06/15/2023      Latest Ref Rng & Units 03/23/2023    9:27 AM 10/31/2022    1:57 PM 08/04/2022    2:38 PM  CMP  Glucose 70 - 99 mg/dL 99  841  324   BUN 6 - 24 mg/dL 12  10  12    Creatinine 0.57 - 1.00 mg/dL 4.01  0.27  2.53   Sodium 134 - 144 mmol/L 138  135  135   Potassium 3.5 - 5.2 mmol/L 4.6  4.0  4.2   Chloride 96 - 106 mmol/L 101  103  101   CO2 20 - 29 mmol/L 24  24  20    Calcium  8.7 - 10.2 mg/dL 9.1  8.7  9.2   Total Protein 6.0 - 8.5 g/dL 6.8  7.0  6.4   Total Bilirubin 0.0 - 1.2 mg/dL 0.3  0.5  0.3   Alkaline Phos 44 - 121 IU/L 82  48  83   AST 0 - 40 IU/L 16  21  20    ALT 0 - 32 IU/L 11  18  20       Anthropometrics  Wt Readings from Last 3 Encounters:  06/17/23 269 lb (122 kg)  06/15/23 270 lb 6.4 oz (122.7 kg)  06/05/23 272 lb (123.4 kg)   Ht Readings from Last 3 Encounters:  06/15/23 5\' 7"  (1.702 m)  06/05/23 5\' 7"  (1.702 m)  05/11/23 5\' 7"  (1.702 m)   Body mass index is 42.13 kg/m. @BMIFA @ Facility age limit for growth %iles is 20 years. Facility age limit for  growth %iles is 20 years.   Clinical Medical Hx: See chart Medications: Switched to [facility]  Labs:  Lab Results  Component Value Date   HGBA1C 5.1 06/15/2023      Latest Ref Rng & Units 03/23/2023    9:27 AM 10/31/2022    1:57 PM 08/04/2022    2:38 PM  CMP  Glucose 70 - 99 mg/dL 99  664  403   BUN 6 - 24 mg/dL 12  10  12    Creatinine 0.57 - 1.00 mg/dL 4.74  2.59  5.63   Sodium 134 - 144 mmol/L 138  135  135   Potassium 3.5 - 5.2 mmol/L 4.6  4.0  4.2   Chloride 96 - 106 mmol/L 101  103  101   CO2 20 - 29 mmol/L 24  24  20    Calcium  8.7 - 10.2 mg/dL 9.1  8.7  9.2   Total Protein 6.0 - 8.5 g/dL 6.8  7.0  6.4  Total Bilirubin 0.0 - 1.2 mg/dL 0.3  0.5  0.3   Alkaline Phos 44 - 121 IU/L 82  48  83   AST 0 - 40 IU/L 16  21  20    ALT 0 - 32 IU/L 11  18  20     Lipid Panel     Component Value Date/Time   CHOL 141 03/23/2023 0927   TRIG 122 03/23/2023 0927   HDL 48 03/23/2023 0927   CHOLHDL 2.9 03/23/2023 0927   CHOLHDL 3.1 11/18/2021 1045   VLDL 21 11/18/2021 1045   LDLCALC 71 03/23/2023 0927   LABVLDL 22 03/23/2023 0927    Notable Signs/Symptoms: none, craves sugars  Lifestyle & Dietary Hx Lives with her son. Currently not working.Applied for disability. Can't stand long on her feet to chronic back pain.  Estimated daily fluid intake: 30-40 oz Supplements: none Sleep: 6-8 hrs Stress / self-care: none Current average weekly physical activity: ADL  24-Hr Dietary Recall First Meal: Bagel plain with fruit Second Meal: Skipped. Dinner: Potatoes, mac/cheese, green beans, water , soda  Estimated Energy Needs Calories: 1200 Carbohydrate: 135g Protein: 90g Fat: 33g   NUTRITION DIAGNOSIS  NB-1.1 Food and nutrition-related knowledge deficit As related to High sugar high calorie processed diet.  As evidenced by DM Type 2 and BMI 47,.   NUTRITION INTERVENTION  Nutrition education (E-1) on the following topics:  Nutrition and Diabetes education provided on My Plate,  CHO counting, meal planning, portion sizes, timing of meals, avoiding snacks between meals unless having a low blood sugar, target ranges for A1C and blood sugars, signs/symptoms and treatment of hyper/hypoglycemia, monitoring blood sugars, taking medications as prescribed, benefits of exercising 30 minutes per day and prevention of complications of DM.   Lifestyle Medicine  - Whole Food, Plant Predominant Nutrition is highly recommended: Eat Plenty of vegetables, Mushrooms, fruits, Legumes, Whole Grains, Nuts, seeds in lieu of processed meats, processed snacks/pastries red meat, poultry, eggs.    -It is better to avoid simple carbohydrates including: Cakes, Sweet Desserts, Ice Cream, Soda (diet and regular), Sweet Tea, Candies, Chips, Cookies, Store Bought Juices, Alcohol in Excess of  1-2 drinks a day, Lemonade,  Artificial Sweeteners, Doughnuts, Coffee Creamers, "Sugar-free" Products, etc, etc.  This is not a complete list.....  Exercise: If you are able: 30 -60 minutes a day ,4 days a week, or 150 minutes a week.  The longer the better.  Combine stretch, strength, and aerobic activities.  If you were told in the past that you have high risk for cardiovascular diseases, you may seek evaluation by your heart doctor prior to initiating moderate to intense exercise programs.    Handouts Provided Include  Lifestyle Medicine Know your numbers CVD risk and DM  Learning Style & Readiness for Change Teaching method utilized: Visual & Auditory  Demonstrated degree of understanding via: Teach Back  Barriers to learning/adherence to lifestyle change: back pain limiting walking.  Goals Established by Pt Goals  Increase protein rich foods with meals Increase water  to 80 oz of water  per day   MONITORING & EVALUATION Dietary intake, weekly physical activity, and bloods sugars in 3 month.  Next Steps  Patient is to work on eating more whole plant based foods and drinking only water .Aaron Aas

## 2023-07-06 ENCOUNTER — Encounter: Payer: Self-pay | Admitting: Family Medicine

## 2023-07-06 ENCOUNTER — Ambulatory Visit: Payer: Medicaid Other | Admitting: Family Medicine

## 2023-07-06 VITALS — BP 114/75 | Temp 98.0°F | Ht 67.0 in | Wt 273.0 lb

## 2023-07-06 DIAGNOSIS — J069 Acute upper respiratory infection, unspecified: Secondary | ICD-10-CM | POA: Diagnosis not present

## 2023-07-06 DIAGNOSIS — Z20828 Contact with and (suspected) exposure to other viral communicable diseases: Secondary | ICD-10-CM | POA: Diagnosis not present

## 2023-07-06 LAB — VERITOR FLU A/B WAIVED
Influenza A: NEGATIVE
Influenza B: NEGATIVE

## 2023-07-06 MED ORDER — OSELTAMIVIR PHOSPHATE 75 MG PO CAPS: 75.0000 mg | ORAL_CAPSULE | Freq: Two times a day (BID) | ORAL | 0 refills | Status: AC

## 2023-07-06 MED ORDER — PROMETHAZINE-DM 6.25-15 MG/5ML PO SYRP: 5.0000 mL | ORAL_SOLUTION | Freq: Four times a day (QID) | ORAL | 0 refills | Status: DC | PRN

## 2023-07-06 NOTE — Progress Notes (Signed)
 BP 114/75   Temp 98 F (36.7 C)   Ht 5\' 7"  (1.702 m)   Wt 123.8 kg   LMP 02/24/2013   SpO2 98%   BMI 42.76 kg/m    Subjective:   Patient ID: Cheryl Randall, female    DOB: [birth year], 48 y.o.   MRN: [id number]  HPI: Cheryl Randall is a 48 y.o. female presenting on 07/06/2023 for URI   URI  Associated symptoms include chest pain, congestion, coughing, ear pain, headaches, a sore throat and wheezing. Pertinent negatives include no diarrhea, nausea, rash or sinus pain.   Symptoms started 1 day ago with headache, cough, congestion along with body ache and chills. Cough is productive with green phlegm. She hasn't checked her fever but feels she had one last night. She has used Tylenol with no improvement of symptoms and Dextromethorphan which has improved cough. She has some wheezing with shortness of breath, but hasn't used her inhaler. Experiencing some chest pain when coughing. She was around her pastor recently who tested positive for the flu 2 days ago. Denies diarrhea.  Relevant past medical, surgical, family and social history reviewed and updated as indicated. Interim medical history since our last visit reviewed. Allergies and medications reviewed and updated.  Review of Systems  Constitutional:  Positive for chills, fatigue and fever.  HENT:  Positive for congestion, ear pain and sore throat. Negative for sinus pain.   Respiratory:  Positive for cough, shortness of breath and wheezing. Negative for chest tightness.   Cardiovascular:  Positive for chest pain. Negative for leg swelling.  Gastrointestinal:  Negative for diarrhea and nausea.  Musculoskeletal:  Positive for myalgias.  Skin:  Negative for rash.  Neurological:  Positive for headaches. Negative for weakness.  Psychiatric/Behavioral:  Negative for agitation.   All other systems reviewed and are negative.   Per HPI unless specifically indicated above   Allergies as of 07/06/2023       Reactions   Codeine  Itching, Rash   Latex Itching, Rash   Norco [hydrocodone-acetaminophen] Itching, Rash   Tape Itching, Rash        Medication List        Accurate as of July 06, 2023  4:01 PM. If you have any questions, ask your nurse or doctor.          albuterol 108 (90 Base) MCG/ACT inhaler Commonly known as: VENTOLIN HFA Inhale 1-2 puffs into the lungs every 6 (six) hours as needed for wheezing or shortness of breath.   Aspercreme Lidocaine 4 % Generic drug: lidocaine Place 1 patch onto the skin daily as needed (pain).   azelastine 0.1 % nasal spray Commonly known as: ASTELIN Place 1 spray into both nostrils 2 (two) times daily. Use in each nostril as directed   Blood Glucose Monitoring Suppl Devi Check sugars up to 2 times daily. May substitute to any manufacturer covered by patient's insurance. E11.9   BLOOD GLUCOSE TEST STRIPS Strp Check sugar up to 2 times daily. May substitute to any manufacturer covered by patient's insurance. E11.9   citalopram 40 MG tablet Commonly known as: CELEXA Take 1 tablet (40 mg total) by mouth daily.   Lancet Device Misc Check sugar up to 2 times daily. May substitute to any manufacturer covered by patient's insurance. E11.9   Lancets Misc. Misc Check sugar up to 2 times daily. May substitute to any manufacturer covered by patient's insurance.E11.9   methocarbamol 750 MG tablet Commonly known as: ROBAXIN TAKE 1  TABLET BY MOUTH EVERY 8 HOURS AS NEEDED FOR MUSCLE SPASMS   metoprolol tartrate 50 MG tablet Commonly known as: LOPRESSOR TAKE ONE (1) TABLET BY MOUTH TWO (2) TIMES DAILY   ondansetron 4 MG disintegrating tablet Commonly known as: ZOFRAN-ODT Take 1 tablet (4 mg total) by mouth every 8 (eight) hours as needed for nausea or vomiting.   oseltamivir 75 MG capsule Commonly known as: Tamiflu Take 1 capsule (75 mg total) by mouth 2 (two) times daily for 5 days. Started by: Elige Radon Donyea Gafford   pantoprazole 40 MG tablet Commonly  known as: Protonix Take 1 tablet (40 mg total) by mouth 2 (two) times daily before a meal.   promethazine-dextromethorphan 6.25-15 MG/5ML syrup Commonly known as: PROMETHAZINE-DM Take 5 mLs by mouth 4 (four) times daily as needed for cough. Started by: Elige Radon Liara Holm   rosuvastatin 10 MG tablet Commonly known as: Crestor Take 1 tablet (10 mg total) by mouth daily. For cholesterol/ fatty liver   SUMAtriptan 50 MG tablet Commonly known as: Imitrex Take 1 tablet (50 mg total) by mouth daily as needed for migraine. May repeat in 4 hours if headache persists or recurs.   tirzepatide 15 MG/0.5ML Pen Commonly known as: MOUNJARO Inject 15 mg into the skin once a week.   valACYclovir 1000 MG tablet Commonly known as: VALTREX Take 2 tablets (2,000 mg total) by mouth 2 (two) times daily. X1 day per flare up of fever blister.         Objective:   BP 114/75   Temp 98 F (36.7 C)   Ht 5\' 7"  (1.702 m)   Wt 123.8 kg   LMP 02/24/2013   SpO2 98%   BMI 42.76 kg/m   Wt Readings from Last 3 Encounters:  07/06/23 123.8 kg  06/17/23 122 kg  06/15/23 122.7 kg    Physical Exam Vitals reviewed.  Constitutional:      General: She is not in acute distress.    Appearance: Normal appearance. She is normal weight. She is not diaphoretic.  HENT:     Right Ear: Tympanic membrane normal.     Left Ear: Tympanic membrane is retracted.     Nose: Nose normal.     Mouth/Throat:     Mouth: Mucous membranes are moist.     Pharynx: Uvula midline. No pharyngeal swelling or oropharyngeal exudate.     Tonsils: No tonsillar exudate.     Comments: Mild erythema Eyes:     Conjunctiva/sclera: Conjunctivae normal.  Neck:     Thyroid: No thyromegaly.  Cardiovascular:     Rate and Rhythm: Normal rate and regular rhythm.     Heart sounds: Normal heart sounds. No murmur heard. Pulmonary:     Effort: Pulmonary effort is normal. No accessory muscle usage or respiratory distress.     Breath sounds:  Normal breath sounds. No wheezing or rhonchi.  Lymphadenopathy:     Cervical: No cervical adenopathy.  Skin:    General: Skin is warm and dry.  Neurological:     Mental Status: She is alert.  Psychiatric:        Behavior: Behavior normal.     Results for orders placed or performed in visit on 07/06/23  Veritor Flu A/B Waived   Collection Time: 07/06/23  3:23 PM  Result Value Ref Range   Influenza A Negative Negative   Influenza B Negative Negative    Assessment & Plan:   Problem List Items Addressed This Visit   None Visit  Diagnoses       Upper respiratory tract infection, unspecified type    -  Primary   Relevant Medications   oseltamivir (TAMIFLU) 75 MG capsule   promethazine-dextromethorphan (PROMETHAZINE-DM) 6.25-15 MG/5ML syrup   Other Relevant Orders   Veritor Flu A/B Waived (Completed)     Exposure to the flu       Relevant Medications   oseltamivir (TAMIFLU) 75 MG capsule   promethazine-dextromethorphan (PROMETHAZINE-DM) 6.25-15 MG/5ML syrup       Diagnosis is Upper Respiratory Infection. She was tested for Flu A/B which came back negative, but with her exposure to someone that is flu positive and her current symptoms she is experiencing we will treat her in that she has the flu. Will give Tamiflu 75 mg capsule twice daily for 5 days and use Promethazine-DM as needed for cough relief. Encouraged use of Albuterol inhaler as needed for wheezing and increased hydration and rest. Follow up plan: Return if symptoms worsen or fail to improve.  Counseling provided for all of the vaccine components Orders Placed This Encounter  Procedures   Veritor Flu A/B Waived    Maximino Sarin PA-S [facility] 07/06/2023, 4:01 PM  I was personally present for all components of the history, physical exam and/or medical decision making.  I agree with the documentation performed by the student and agree with assessment and plan above.  Likely upper  respiratory tract infection with flu exposure, will treat as such Arville Care, MD [facility] [place] 07/20/2023, 7:44 AM

## 2023-07-07 ENCOUNTER — Encounter: Payer: Self-pay | Admitting: Family Medicine

## 2023-07-07 ENCOUNTER — Telehealth: Payer: Medicaid Other | Admitting: Family Medicine

## 2023-07-07 DIAGNOSIS — R6889 Other general symptoms and signs: Secondary | ICD-10-CM

## 2023-07-07 NOTE — Progress Notes (Signed)
[place]   Was seen yesterday for URI/FLu no change in treatment plans.  Patient acknowledged agreement and understanding of the plan.

## 2023-07-23 ENCOUNTER — Ambulatory Visit: Payer: Self-pay | Admitting: Family Medicine

## 2023-07-23 NOTE — Telephone Encounter (Signed)
 Chief Complaint: Neck pain Symptoms: Stiffness, weakness/tingling, pain. Frequency: Constant Pertinent Negatives: Patient denies SOB, swelling, headache, fever Disposition: [x] ED /[] Urgent Care (no appt availability in office) / [] Appointment(In office/virtual)/ []  [facility]/ [] Home Care/ [] Refused Recommended Disposition /[] [facility]/ []  Follow-up with PCP Additional Notes: Patient called with complaints of neck pain that radiates to right arm down to right hands/fingers that started two days ago. Patient states pain is aggravated when turning her head to the left, with subsequent worsening radiation pain to right arm. Patient states neck is stiff and pain to right arm is described as throbbing, pain is 8/10 and unrelieved with medication, patient feels some weakness and tingling in right arm. Patient denies any facial drooping, leg weakness/numbness, slurred speech, headache, fever, vision changes. Patient states symptoms are worsening over time. Patient advised by this RN to go to ER per protocol to which patient was agreeable. Patient notified that note will be routed to practice for PCP review. Patient verbalized understanding.   Copied from CRM [id number]. Topic: Clinical - Red Word Triage >> Jul 23, 2023  9:19 AM Clayton Bibles wrote: Red Word that prompted transfer to Nurse Triage: She is having severe pain from her right neck to her right finger tips. No other pain Reason for Disposition  Weakness of an arm or hand  Answer Assessment - Initial Assessment Questions 1. ONSET: "When did the pain begin?"      Two days go 2. LOCATION: "Where does it hurt?"      Right neck 3. PATTERN "Does the pain come and go, or has it been constant since it started?"      Constant 4. SEVERITY: "How bad is the pain?"  (Scale 1-10; or mild, moderate, severe)   - NO PAIN (0): no pain or only slight stiffness    - MILD (1-3): doesn't interfere with normal activities    - MODERATE (4-7):  interferes with normal activities or awakens from sleep    - SEVERE (8-10):  excruciating pain, unable to do any normal activities      Severe 5. RADIATION: "Does the pain go anywhere else, shoot into your arms?"     Right arm to right elbow to fingertips 6. CORD SYMPTOMS: "Any weakness or numbness of the arms or legs?"     "Little bit on my right arm." 7. CAUSE: "What do you think is causing the neck pain?"     "I dont know, but I woke up and it was hurting like this. Nothing is helping it.I can't take it anymore." 8. NECK OVERUSE: "Any recent activities that involved turning or twisting the neck?"     Denies 9. OTHER SYMPTOMS: "Do you have any other symptoms?" (e.g., headache, fever, chest pain, difficulty breathing, neck swelling)     Denies 10. PREGNANCY: "Is there any chance you are pregnant?" "When was your last menstrual period?"     Denies  Protocols used: Neck Pain or Stiffness-A-AH

## 2023-07-25 ENCOUNTER — Emergency Department (HOSPITAL_BASED_OUTPATIENT_CLINIC_OR_DEPARTMENT_OTHER)
Admission: EM | Admit: 2023-07-25 | Discharge: 2023-07-25 | Disposition: A | Discharge status: Home / Self Care | Payer: Medicaid Other

## 2023-07-25 ENCOUNTER — Emergency Department (HOSPITAL_BASED_OUTPATIENT_CLINIC_OR_DEPARTMENT_OTHER): Payer: Medicaid Other

## 2023-07-25 ENCOUNTER — Encounter (HOSPITAL_BASED_OUTPATIENT_CLINIC_OR_DEPARTMENT_OTHER): Payer: Self-pay | Admitting: Emergency Medicine

## 2023-07-25 DIAGNOSIS — I10 Essential (primary) hypertension: Secondary | ICD-10-CM | POA: Diagnosis not present

## 2023-07-25 DIAGNOSIS — Z79899 Other long term (current) drug therapy: Secondary | ICD-10-CM | POA: Insufficient documentation

## 2023-07-25 DIAGNOSIS — Z91040 Latex allergy status: Secondary | ICD-10-CM | POA: Diagnosis not present

## 2023-07-25 DIAGNOSIS — M5412 Radiculopathy, cervical region: Secondary | ICD-10-CM | POA: Diagnosis not present

## 2023-07-25 DIAGNOSIS — M542 Cervicalgia: Secondary | ICD-10-CM | POA: Diagnosis present

## 2023-07-25 DIAGNOSIS — M47813 Spondylosis without myelopathy or radiculopathy, cervicothoracic region: Secondary | ICD-10-CM | POA: Diagnosis not present

## 2023-07-25 DIAGNOSIS — M4802 Spinal stenosis, cervical region: Secondary | ICD-10-CM | POA: Diagnosis not present

## 2023-07-25 DIAGNOSIS — M47812 Spondylosis without myelopathy or radiculopathy, cervical region: Secondary | ICD-10-CM | POA: Diagnosis not present

## 2023-07-25 MED ORDER — KETOROLAC TROMETHAMINE 15 MG/ML IJ SOLN
15.0000 mg | Freq: Once | INTRAMUSCULAR | Status: AC
Start: 2023-07-25 — End: 2023-07-25
  Administered 2023-07-25: 15 mg via INTRAMUSCULAR
  Filled 2023-07-25: qty 1

## 2023-07-25 NOTE — ED Notes (Signed)
 Patient discharged to home after review of written discharge instructions.  Pain only slightly improved with toradol.  Patient to follow up with ortho, return to ED as needed, for new or worsening symptoms.  Discharged alert, oriented and self-ambulatory, VSS

## 2023-07-25 NOTE — Discharge Instructions (Addendum)
 Your CT scan showed some degenerative changes outlined in the scan result below that could be causing your pain. No signs of a herniated disc.   You may use up to 600mg  ibuprofen every 6 hours as needed for pain.  Do not exceed 2.4g of ibuprofen per day.  You are given a dose of similar medication here today.  Your next dose can be no sooner than 9 PM tonight.  You may take up to 1000mg  of tylenol every 6 hours as needed for pain. Do not take more then 4g per day.   Please follow up with the spine specialist listed below soon as possible for further management of your pain.  You can also reach out to your PCP for a referral to a spine specialist.  You may also benefit from physical therapy.  You may self refer or get a referral from her PCP.  Please return to the ER if you develop any weakness in your arm, fevers, any other new or concerning symptoms.    "EXAM: CT CERVICAL SPINE WITHOUT CONTRAST   TECHNIQUE: Multidetector CT imaging of the cervical spine was performed without intravenous contrast. Multiplanar CT image reconstructions were also generated.   RADIATION DOSE REDUCTION: This exam was performed according to the departmental dose-optimization program which includes automated exposure control, adjustment of the mA and/or kV according to patient size and/or use of iterative reconstruction technique.   COMPARISON:  X-ray 01/29/2009   FINDINGS: Alignment: Facet joints are aligned without dislocation or traumatic listhesis. Dens and lateral masses are aligned.   Skull base and vertebrae: No acute fracture. No primary bone lesion or focal pathologic process.   Soft tissues and spinal canal: No prevertebral fluid or swelling. No visible canal hematoma.   Disc levels: Intervertebral disc heights are preserved. Mild endplate spurring at the C6-7 level. Degenerative facet arthropathy of the lower cervical spine, most pronounced at C6-7 and C7-T1. Foraminal narrowing is present  bilaterally at C6-C7.   Upper chest: Included lung apices are clear.   Other: None.   IMPRESSION: 1. No acute fracture or traumatic listhesis of the cervical spine. 2. Mild degenerative cervical spondylosis, most pronounced at C6-C7.     Electronically Signed   By: Duanne Guess D.O.   On: 07/25/2023 15:41"

## 2023-07-25 NOTE — ED Provider Notes (Signed)
 [place] [facility]ENC[facility] DEPARTMENT AT [facility] Provider Note   CSN: [id number] Arrival date & time: 07/25/23  1215     History  Chief Complaint  Patient presents with   Neck Pain    Cheryl Randall is a 48 y.o. female with history of hypertension, lumbosacral degenerative disc disease and herniated disc with lumbar laminectomy, presents with concern for pain in her neck that radiates down into her right hand.  Pain specifically goes into the right 3rd through 5th digits. States this started about 4 days ago.  Denies any trauma such as falls or MVCs.  Reports she initially thought this was a strained muscle, but as it persisted, she thought maybe there is something else going on.     Neck Pain      Home Medications Prior to Admission medications   Medication Sig Start Date End Date Taking? Authorizing Provider  albuterol (VENTOLIN HFA) 108 (90 Base) MCG/ACT inhaler Inhale 1-2 puffs into the lungs every 6 (six) hours as needed for wheezing or shortness of breath. 07/07/22   Jannifer Rodney A, FNP  azelastine (ASTELIN) 0.1 % nasal spray Place 1 spray into both nostrils 2 (two) times daily. Use in each nostril as directed 05/11/23   Martina Sinner, NP  Blood Glucose Monitoring Suppl DEVI Check sugars up to 2 times daily. May substitute to any manufacturer covered by patient's insurance. E11.9 08/04/22   Raliegh Ip, DO  citalopram (CELEXA) 40 MG tablet Take 1 tablet (40 mg total) by mouth daily. 06/15/23   Raliegh Ip, DO  Glucose Blood (BLOOD GLUCOSE TEST STRIPS) STRP Check sugar up to 2 times daily. May substitute to any manufacturer covered by patient's insurance. E11.9 08/04/22   Raliegh Ip, DO  Lancet Device MISC Check sugar up to 2 times daily. May substitute to any manufacturer covered by patient's insurance. E11.9 08/04/22   Raliegh Ip, DO  Lancets Misc. MISC Check sugar up to 2 times daily. May substitute to any manufacturer covered by  patient's insurance.E11.9 08/04/22   Delynn Flavin M, DO  lidocaine (ASPERCREME LIDOCAINE) 4 % Place 1 patch onto the skin daily as needed (pain).    [provider]  methocarbamol (ROBAXIN) 750 MG tablet TAKE 1 TABLET BY MOUTH EVERY 8 HOURS AS NEEDED FOR MUSCLE SPASMS 05/18/23   Delynn Flavin M, DO  metoprolol tartrate (LOPRESSOR) 50 MG tablet TAKE ONE (1) TABLET BY MOUTH TWO (2) TIMES DAILY 06/15/23   Delynn Flavin M, DO  ondansetron (ZOFRAN-ODT) 4 MG disintegrating tablet Take 1 tablet (4 mg total) by mouth every 8 (eight) hours as needed for nausea or vomiting. 10/30/22   Jannifer Rodney A, FNP  pantoprazole (PROTONIX) 40 MG tablet Take 1 tablet (40 mg total) by mouth 2 (two) times daily before a meal. 11/20/22   Aida Raider, NP  promethazine-dextromethorphan (PROMETHAZINE-DM) 6.25-15 MG/5ML syrup Take 5 mLs by mouth 4 (four) times daily as needed for cough. 07/06/23   Dettinger, Elige Radon, MD  rosuvastatin (CRESTOR) 10 MG tablet Take 1 tablet (10 mg total) by mouth daily. For cholesterol/ fatty liver 06/15/23   Delynn Flavin M, DO  SUMAtriptan (IMITREX) 50 MG tablet Take 1 tablet (50 mg total) by mouth daily as needed for migraine. May repeat in 4 hours if headache persists or recurs. 08/04/22   Raliegh Ip, DO  tirzepatide [facility]) 15 MG/0.5ML Pen Inject 15 mg into the skin once a week. 06/15/23   Raliegh Ip,  DO  valACYclovir (VALTREX) 1000 MG tablet Take 2 tablets (2,000 mg total) by mouth 2 (two) times daily. X1 day per flare up of fever blister. 06/15/23   Raliegh Ip, DO      Allergies    Codeine, Latex, Norco [hydrocodone-acetaminophen], and Tape    Review of Systems   Review of Systems  Musculoskeletal:  Positive for neck pain.    Physical Exam Updated Vital Signs BP 123/77 (BP Location: Right Arm)   Pulse 71   Temp (!) 97.2 F (36.2 C) (Oral)   Resp 18   Ht 5\' 7"  (1.702 m)   Wt 117.9 kg   LMP 02/24/2013   SpO2 97%   BMI 40.72  kg/m  Physical Exam Vitals and nursing note reviewed.  Constitutional:      Appearance: Normal appearance.  HENT:     Head: Atraumatic.  Cardiovascular:     Rate and Rhythm: Normal rate and regular rhythm.     Comments: Radial pulse 2+ bilaterally Pulmonary:     Effort: Pulmonary effort is normal.  Musculoskeletal:     Comments: No point tenderness along the cervical, thoracic, or lumbar spine.  Tender to palpation diffusely of the right trapezius muscle.  Full range of motion the bilateral shoulders, elbows, wrists, and 1st-5th digits.   Pain in the neck/arm worsens with rotating neck to the left, away from the affected side.  Neurological:     General: No focal deficit present.     Mental Status: She is alert.     Comments: Intact sensation of the 1st through 5th digits of the right and left hand  5/5 strength with resisted elbow flexion extension, wrist flexion and extension  Psychiatric:        Mood and Affect: Mood normal.        Behavior: Behavior normal.     ED Results / Procedures / Treatments   Labs (all labs ordered are listed, but only abnormal results are displayed) Labs Reviewed - No data to display  EKG None  Radiology CT Cervical Spine Wo Contrast Result Date: 07/25/2023 CLINICAL DATA:  Possible right sided radiculopathy EXAM: CT CERVICAL SPINE WITHOUT CONTRAST TECHNIQUE: Multidetector CT imaging of the cervical spine was performed without intravenous contrast. Multiplanar CT image reconstructions were also generated. RADIATION DOSE REDUCTION: This exam was performed according to the departmental dose-optimization program which includes automated exposure control, adjustment of the mA and/or kV according to patient size and/or use of iterative reconstruction technique. COMPARISON:  X-ray 01/29/2009 FINDINGS: Alignment: Facet joints are aligned without dislocation or traumatic listhesis. Dens and lateral masses are aligned. Skull base and vertebrae: No acute  fracture. No primary bone lesion or focal pathologic process. Soft tissues and spinal canal: No prevertebral fluid or swelling. No visible canal hematoma. Disc levels: Intervertebral disc heights are preserved. Mild endplate spurring at the C6-7 level. Degenerative facet arthropathy of the lower cervical spine, most pronounced at C6-7 and C7-T1. Foraminal narrowing is present bilaterally at C6-C7. Upper chest: Included lung apices are clear. Other: None. IMPRESSION: 1. No acute fracture or traumatic listhesis of the cervical spine. 2. Mild degenerative cervical spondylosis, most pronounced at C6-C7. Electronically Signed   By: Duanne Guess D.O.   On: 07/25/2023 15:41    Procedures Procedures    Medications Ordered in ED Medications  ketorolac (TORADOL) 15 MG/ML injection 15 mg (15 mg Intramuscular Given 07/25/23 1504)    ED Course/ Medical Decision Making/ A&P  Medical Decision Making Amount and/or Complexity of Data Reviewed Radiology: ordered.  Risk Prescription drug management.     Differential diagnosis includes but is not limited to herniated nucleus pulposus, fracture, nerve impingement  ED Course:  Well-appearing, no acute distress.  Vital signs stable.  She does have pain upon palpation of the right trapezius muscle that reproduces her pain.  She also reports pain that shoots down into the hand.   Given this sounds radicular, obtain CT cervical spine imaging which showed C6-C7 endplate spurring and foraminal narrowing.  Intervertebral disc heights are preserved.. She is neurovascularly intact in the bilateral upper extremities.  5/5 strength maintained in the bilateral upper extremities. IM Toradol given for pain  Upon reevaluation, pain improved with Toradol.  Given the foraminal narrowing noted on CT, suspect she may have a radiculopathy given the shooting pain down into her arm.  Low concern for any other emergent pathology at this  time.  Patient stable and appropriate for discharge home  Impression: Cervical radiculopathy  Disposition:  The patient was discharged home with instructions to follow-up with a spine specialist, provided her with contact information on her discharge paperwork.  Recommended she obtain physical therapy referral from the spine specialist or PCP to up with her pain.  Tylenol and ibuprofen as needed for pain. Return precautions given.  Imaging Studies ordered: I ordered imaging studies including CT cervical spine I independently visualized the imaging with scope of interpretation limited to determining acute life threatening conditions related to emergency care. Imaging showed C6-C7 endplate spurring and foraminal narrowing.  Intervertebral disc heights are preserved I agree with the radiologist interpretation               Final Clinical Impression(s) / ED Diagnoses Final diagnoses:  Cervical radiculopathy    Rx / DC Orders ED Discharge Orders     None         Arabella Merles, PA-C 07/25/23 1647    Royanne Foots, DO 07/26/23 1221

## 2023-07-25 NOTE — ED Triage Notes (Signed)
 Pt c/o RT side neck pain, radiating to RT arm, fingers tingling x 4 days. Endorses concern for bulging disc, hx of same. PT aox4

## 2023-08-05 ENCOUNTER — Ambulatory Visit (HOSPITAL_COMMUNITY)
Admission: RE | Admit: 2023-08-05 | Discharge: 2023-08-05 | Disposition: A | Discharge status: Home / Self Care | Source: Ambulatory Visit | Attending: Family Medicine | Admitting: Family Medicine

## 2023-08-05 ENCOUNTER — Encounter: Payer: Self-pay | Admitting: Family Medicine

## 2023-08-05 ENCOUNTER — Ambulatory Visit (INDEPENDENT_AMBULATORY_CARE_PROVIDER_SITE_OTHER): Admitting: Family Medicine

## 2023-08-05 VITALS — BP 111/75 | HR 80 | Temp 96.7°F | Ht 67.0 in | Wt 272.0 lb

## 2023-08-05 DIAGNOSIS — N61 Mastitis without abscess: Secondary | ICD-10-CM | POA: Diagnosis not present

## 2023-08-05 DIAGNOSIS — R928 Other abnormal and inconclusive findings on diagnostic imaging of breast: Secondary | ICD-10-CM | POA: Diagnosis not present

## 2023-08-05 DIAGNOSIS — I96 Gangrene, not elsewhere classified: Secondary | ICD-10-CM | POA: Insufficient documentation

## 2023-08-05 MED ORDER — DOXYCYCLINE HYCLATE 100 MG PO TABS: 100.0000 mg | ORAL_TABLET | Freq: Two times a day (BID) | ORAL | 0 refills | Status: DC

## 2023-08-05 NOTE — Progress Notes (Signed)
 Subjective:  Patient ID: Cheryl Randall, female    DOB: 1976-05-24, 48 y.o.   MRN: [id number]  Patient Care Team: Raliegh Ip, DO as PCP - General (Family Medicine) Wendall Stade, MD as PCP - Cardiology (Cardiology)   Chief Complaint:  Alcus Dad (On right breast.  States blister came up a week ago and popped. )   HPI: Cheryl Randall is a 48 y.o. female presenting on 08/05/2023 for Blister (On right breast.  States blister came up a week ago and popped. )   Discussed the use of AI scribe software for clinical note transcription with the patient, who gave verbal consent to proceed.  History of Present Illness   Cheryl Randall is a 48 year old female who presents with a necrotic lesion on the breast following a blister.  Approximately one week ago, she developed a lesion on her breast that initially presented as a blister. The blister ruptured while she was in the shower, and the area subsequently progressed to a necrotic lesion with a black center and surrounding erythema. The lesion is not painful, and there are no systemic symptoms such as fever or malaise. Initially, the drainage from the blister was clear before the necrotic appearance developed. There is a possibility that the lesion may be due to a spider bite, although she is uncertain of the cause and has not experienced other symptoms indicative of a spider bite.  She has been experiencing headaches for the past three to four days.          Relevant past medical, surgical, family, and social history reviewed and updated as indicated.  Allergies and medications reviewed and updated. Data reviewed: Chart in Epic.   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   Migraine    Palpitations 07/26/2019   Panic attack 06/26/2017   PONV (postoperative nausea and vomiting)    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   Staphylococcus aureus bacteremia with sepsis (HCC)    Wound infection 06/30/2015    Past Surgical History:  Procedure Laterality Date   ABDOMINAL HYSTERECTOMY     20s   BACK SURGERY     x4   BIOPSY  12/15/2022   Procedure: BIOPSY;  Surgeon: Lanelle Bal, DO;  Location: [facility];  Service: Endoscopy;;   BREAST SURGERY Bilateral    reduction   CHOLECYSTECTOMY     COLONOSCOPY WITH PROPOFOL N/A 12/15/2022   Procedure: COLONOSCOPY WITH PROPOFOL;  Surgeon: Lanelle Bal, DO;  Location: [facility];  Service: Endoscopy;  Laterality: N/A;  10:30 am, asa 3   ESOPHAGOGASTRODUODENOSCOPY (EGD) WITH PROPOFOL N/A 12/15/2022   Procedure: ESOPHAGOGASTRODUODENOSCOPY (EGD) WITH PROPOFOL;  Surgeon: Lanelle Bal, DO;  Location: [facility];  Service: Endoscopy;  Laterality: N/A;   LUMBAR LAMINECTOMY/DECOMPRESSION MICRODISCECTOMY N/A 06/30/2015   Procedure: wound exploration irrigation and  debridement ,explantation of bone growth stimulator, placement of wound vac;  Surgeon: Loura Halt Ditty, MD;  Location: [facility];  Service: Neurosurgery;  Laterality: N/A;   REDUCTION MAMMAPLASTY Bilateral 2004    Social History   Socioeconomic History   Marital status: Divorced    Spouse name: Not on file   Number of children: 1   Years of education: Not on file   Highest education level: Some college, no degree  Occupational History   Not on file  Tobacco Use    Smoking status: Former    Current packs/day: 0.00    Types: Cigarettes    Quit date: 06/02/2014    Years since quitting: 9.1   Smokeless tobacco: Never  Vaping Use   Vaping status: Never Used  Substance and Sexual Activity   Alcohol use: No   Drug use: Not Currently    Types: Marijuana    Comment: none since a teenager   Sexual activity: Not Currently    Birth control/protection: Surgical  Other Topics Concern   Not on file  Social History Narrative   Not on file   Social Drivers of Health   Financial Resource Strain: Not on file  Food Insecurity: No Food Insecurity (02/28/2022)   Hunger Vital Sign    Worried About Running Out of Food in the Last Year: Never true    Ran Out of Food in the Last Year: Never true  Transportation Needs: No Transportation Needs (02/28/2022)   PRAPARE - Administrator, Civil Service (Medical): No    Lack of Transportation (Non-Medical): No  Physical Activity: Not on file  Stress: Not on file  Social Connections: Not on file  Intimate Partner Violence: Not on file    Outpatient Encounter Medications as of 08/05/2023  Medication Sig   albuterol (VENTOLIN HFA) 108 (90 Base) MCG/ACT inhaler Inhale 1-2 puffs into the lungs every 6 (six) hours as needed for wheezing or shortness of breath.   azelastine (ASTELIN) 0.1 % nasal spray Place 1 spray into both nostrils 2 (two) times daily. Use in each nostril as directed   Blood Glucose Monitoring Suppl DEVI Check sugars up to 2 times daily. May substitute to any manufacturer covered by patient's insurance. E11.9   citalopram (CELEXA) 40 MG tablet Take 1 tablet (40 mg total) by mouth daily.   doxycycline (VIBRA-TABS) 100 MG tablet Take 1 tablet (100 mg total) by mouth 2 (two) times daily for 10 days. 1 po bid   Glucose Blood (BLOOD GLUCOSE TEST STRIPS) STRP Check sugar up to 2 times daily. May substitute to any manufacturer covered by patient's insurance. E11.9   Lancet Device MISC Check sugar up to 2  times daily. May substitute to any manufacturer covered by patient's insurance. E11.9   Lancets Misc. MISC Check sugar up to 2 times daily. May substitute to any manufacturer covered by patient's insurance.E11.9   lidocaine (ASPERCREME LIDOCAINE) 4 % Place 1 patch onto the skin daily as needed (pain).   methocarbamol (ROBAXIN) 750 MG tablet TAKE 1 TABLET BY MOUTH EVERY 8 HOURS AS NEEDED FOR MUSCLE SPASMS   metoprolol tartrate (LOPRESSOR) 50 MG tablet TAKE ONE (1) TABLET BY MOUTH TWO (2) TIMES DAILY   ondansetron (ZOFRAN-ODT) 4 MG disintegrating tablet Take 1 tablet (4 mg total) by mouth every 8 (eight) hours as needed for nausea or vomiting.   pantoprazole (PROTONIX) 40 MG tablet Take 1 tablet (40 mg total) by mouth 2 (two) times daily before a meal.  promethazine-dextromethorphan (PROMETHAZINE-DM) 6.25-15 MG/5ML syrup Take 5 mLs by mouth 4 (four) times daily as needed for cough.   rosuvastatin (CRESTOR) 10 MG tablet Take 1 tablet (10 mg total) by mouth daily. For cholesterol/ fatty liver   SUMAtriptan (IMITREX) 50 MG tablet Take 1 tablet (50 mg total) by mouth daily as needed for migraine. May repeat in 4 hours if headache persists or recurs.   tirzepatide (MOUNJARO) 15 MG/0.5ML Pen Inject 15 mg into the skin once a week.   valACYclovir (VALTREX) 1000 MG tablet Take 2 tablets (2,000 mg total) by mouth 2 (two) times daily. X1 day per flare up of fever blister.   No facility-administered encounter medications on file as of 08/05/2023.    Allergies  Allergen Reactions   Codeine Itching and Rash   Latex Itching and Rash   Norco [Hydrocodone-Acetaminophen] Itching and Rash   Tape Itching and Rash    Pertinent ROS per HPI, otherwise unremarkable      Objective:  BP 111/75   Pulse 80   Temp (!) 96.7 F (35.9 C)   Ht 5\' 7"  (1.702 m)   Wt 272 lb (123.4 kg)   LMP 02/24/2013   SpO2 96%   BMI 42.60 kg/m    Wt Readings from Last 3 Encounters:  08/05/23 272 lb (123.4 kg)  07/25/23 260 lb  (117.9 kg)  07/06/23 273 lb (123.8 kg)    Physical Exam Vitals and nursing note reviewed.  Constitutional:      General: She is not in acute distress.    Appearance: Normal appearance. She is obese. She is not ill-appearing, toxic-appearing or diaphoretic.  HENT:     Head: Normocephalic and atraumatic.     Mouth/Throat:     Mouth: Mucous membranes are moist.  Eyes:     Conjunctiva/sclera: Conjunctivae normal.     Pupils: Pupils are equal, round, and reactive to light.  Cardiovascular:     Rate and Rhythm: Normal rate and regular rhythm.     Heart sounds: Normal heart sounds.  Pulmonary:     Effort: Pulmonary effort is normal.     Breath sounds: Normal breath sounds.  Skin:    General: Skin is warm and dry.     Capillary Refill: Capillary refill takes less than 2 seconds.       Neurological:     General: No focal deficit present.     Mental Status: She is alert and oriented to person, place, and time.  Psychiatric:        Mood and Affect: Mood normal.        Behavior: Behavior normal.        Thought Content: Thought content normal.        Judgment: Judgment normal.     SKIN: Black necrotic area on the bottom of the right breast with a black center with surrounding redness.        Results for orders placed or performed in visit on 07/06/23  Veritor Flu A/B Waived   Collection Time: 07/06/23  3:23 PM  Result Value Ref Range   Influenza A Negative Negative   Influenza B Negative Negative       Pertinent labs & imaging results that were available during my care of the patient were reviewed by me and considered in my medical decision making.  Assessment & Plan:  Cheryl Randall was seen today for blister.  Diagnoses and all orders for this visit:  Cellulitis of right breast -     doxycycline (  VIBRA-TABS) 100 MG tablet; Take 1 tablet (100 mg total) by mouth 2 (two) times daily for 10 days. 1 po bid -     [name] CHEST SOFT TISSUE; Future -     Ambulatory referral to General  Surgery  Skin necrosis (HCC) -     doxycycline (VIBRA-TABS) 100 MG tablet; Take 1 tablet (100 mg total) by mouth 2 (two) times daily for 10 days. 1 po bid -     [name] CHEST SOFT TISSUE; Future -     Ambulatory referral to General Surgery     Assessment and Plan    Brown Recluse Spider Bite Necrotic lesion on the wrist with a black center and surrounding cellulitis, consistent with a brown recluse spider bite. No systemic symptoms. Headaches for the past 3-4 days may be related. Discussed need for ultrasound to assess underlying tissue and systemic antibiotics. Explained doxycycline as the preferred antibiotic and potential need for debridement by a general surgeon. Emphasized risks of necrosis and importance of cleanliness. Confirmed tetanus vaccination status. - Order ultrasound of the lesion - Prescribe doxycycline - Refer to Dr. Dwain Sarna for potential debridement - Ensure tetanus vaccination is up to date - Send prescription to [facility] - Place stat referral for ultrasound and general surgery consultation - Advise to keep phone on for scheduling calls  Headache Headaches for the past 3-4 days, possibly related to the spider bite. - Monitor headache symptoms and reassess if she persists or worsens.          Continue all other maintenance medications.  Follow up plan: Return if symptoms worsen or fail to improve.   Continue healthy lifestyle choices, including diet (rich in fruits, vegetables, and lean proteins, and low in salt and simple carbohydrates) and exercise (at least 30 minutes of moderate physical activity daily).   The above assessment and management plan was discussed with the patient. The patient verbalized understanding of and has agreed to the management plan. Patient is aware to call the clinic if they develop any new symptoms or if symptoms persist or worsen. Patient is aware when to return to the clinic for a follow-up visit. Patient educated on  when it is appropriate to go to the emergency department.   Kari Baars, FNP-C [facility] [phone]

## 2023-08-06 ENCOUNTER — Encounter: Payer: Self-pay | Admitting: Family Medicine

## 2023-08-06 DIAGNOSIS — I96 Gangrene, not elsewhere classified: Secondary | ICD-10-CM

## 2023-08-06 DIAGNOSIS — N61 Mastitis without abscess: Secondary | ICD-10-CM

## 2023-08-06 NOTE — Addendum Note (Signed)
 Addended by: Sonny Masters on: 08/06/2023 08:16 AM   Modules accepted: Orders

## 2023-08-06 NOTE — Addendum Note (Signed)
 Addended by: Sonny Masters on: 08/06/2023 08:21 AM   Modules accepted: Orders

## 2023-08-06 NOTE — Addendum Note (Signed)
 Addended by: Sonny Masters on: 08/06/2023 08:20 AM   Modules accepted: Orders

## 2023-08-09 DIAGNOSIS — L259 Unspecified contact dermatitis, unspecified cause: Secondary | ICD-10-CM | POA: Diagnosis not present

## 2023-08-09 DIAGNOSIS — T148XXD Other injury of unspecified body region, subsequent encounter: Secondary | ICD-10-CM | POA: Diagnosis not present

## 2023-08-10 ENCOUNTER — Other Ambulatory Visit: Payer: Self-pay | Admitting: Family Medicine

## 2023-08-10 ENCOUNTER — Ambulatory Visit
Admission: RE | Admit: 2023-08-10 | Discharge: 2023-08-10 | Disposition: A | Discharge status: Home / Self Care | Source: Ambulatory Visit | Attending: Family Medicine

## 2023-08-10 ENCOUNTER — Encounter

## 2023-08-10 ENCOUNTER — Ambulatory Visit: Payer: Self-pay | Admitting: Family Medicine

## 2023-08-10 DIAGNOSIS — I96 Gangrene, not elsewhere classified: Secondary | ICD-10-CM

## 2023-08-10 DIAGNOSIS — N61 Mastitis without abscess: Secondary | ICD-10-CM

## 2023-08-10 DIAGNOSIS — N6489 Other specified disorders of breast: Secondary | ICD-10-CM | POA: Diagnosis not present

## 2023-08-10 NOTE — Telephone Encounter (Signed)
 Copied from CRM [id number]. Topic: Clinical - Medication Question >> Aug 10, 2023  2:35 PM Priscille Loveless wrote: Reason for CRM: Pt went to [facility] and dr advised no puss pocket but the skin is rotting from the spider bite and will need to have another antibiotic given to her asap. Pt was allergic to the doxycycline.

## 2023-08-11 DIAGNOSIS — S20161A Insect bite (nonvenomous) of breast, right breast, initial encounter: Secondary | ICD-10-CM | POA: Diagnosis not present

## 2023-08-11 DIAGNOSIS — W57XXXA Bitten or stung by nonvenomous insect and other nonvenomous arthropods, initial encounter: Secondary | ICD-10-CM | POA: Diagnosis not present

## 2023-08-11 MED ORDER — AMOXICILLIN-POT CLAVULANATE 875-125 MG PO TABS: 1.0000 | ORAL_TABLET | Freq: Two times a day (BID) | ORAL | 0 refills | Status: DC

## 2023-08-11 NOTE — Telephone Encounter (Signed)
 Lmtcb

## 2023-08-11 NOTE — Addendum Note (Signed)
 Addended by: Sonny Masters on: 08/11/2023 09:54 AM   Modules accepted: Orders

## 2023-08-11 NOTE — Telephone Encounter (Signed)
 Patient aware and verbalizes understanding.  States she is on the way to their office now.

## 2023-08-11 NOTE — Telephone Encounter (Signed)
 Patient states that she has stopped taking the doxy due to her having hives all over her body.  Would like something else sent in.    She is waiting on a call back from Dr. Doreen Salvage office about scheduling.

## 2023-08-12 ENCOUNTER — Other Ambulatory Visit: Payer: Self-pay | Admitting: Family Medicine

## 2023-08-12 DIAGNOSIS — E119 Type 2 diabetes mellitus without complications: Secondary | ICD-10-CM

## 2023-08-18 ENCOUNTER — Encounter: Payer: Self-pay | Admitting: Family Medicine

## 2023-08-18 ENCOUNTER — Ambulatory Visit

## 2023-08-18 ENCOUNTER — Ambulatory Visit: Admitting: Family Medicine

## 2023-08-18 VITALS — BP 126/85 | HR 93 | Temp 97.2°F | Ht 67.0 in | Wt 270.0 lb

## 2023-08-18 DIAGNOSIS — M62838 Other muscle spasm: Secondary | ICD-10-CM

## 2023-08-18 DIAGNOSIS — M5412 Radiculopathy, cervical region: Secondary | ICD-10-CM

## 2023-08-18 DIAGNOSIS — M542 Cervicalgia: Secondary | ICD-10-CM | POA: Diagnosis not present

## 2023-08-18 DIAGNOSIS — G8929 Other chronic pain: Secondary | ICD-10-CM

## 2023-08-18 MED ORDER — KETOROLAC TROMETHAMINE 60 MG/2ML IM SOLN
60.0000 mg | Freq: Once | INTRAMUSCULAR | Status: AC
  Administered 2023-08-18: 60 mg via INTRAMUSCULAR

## 2023-08-18 MED ORDER — METHYLPREDNISOLONE ACETATE 40 MG/ML IJ SUSP
40.0000 mg | Freq: Once | INTRAMUSCULAR | Status: AC
  Administered 2023-08-18: 40 mg via INTRAMUSCULAR

## 2023-08-18 MED ORDER — CYCLOBENZAPRINE HCL 10 MG PO TABS: 10.0000 mg | ORAL_TABLET | Freq: Three times a day (TID) | ORAL | 0 refills | Status: DC | PRN

## 2023-08-18 NOTE — Progress Notes (Signed)
 Subjective:  Patient ID: Cheryl Randall, female    DOB: 1976-01-25, 48 y.o.   MRN: [id number]  Patient Care Team: Raliegh Ip, DO as PCP - General (Family Medicine) Wendall Stade, MD as PCP - Cardiology (Cardiology)   Chief Complaint:  Shoulder Pain (Right shoulder pain and neck pain after bushing her hair this morning. )   HPI: Cheryl Randall is a 48 y.o. female presenting on 08/18/2023 for Shoulder Pain (Right shoulder pain and neck pain after bushing her hair this morning. )   Pt presents with right shoulder pain that started today while brushing her hair. Recent CT Cervial Spine revealed degenerative disc disease. She states the pain in her right shoulder starts in her neck and radiates down the top of her shoulder and down to her elbow.  Shoulder Pain  The pain is present in the neck and right shoulder. This is a recurrent problem. The current episode started today (while brushing hair). The problem occurs constantly. The problem has been unchanged. The quality of the pain is described as burning, pounding, aching and dull. The pain is severe. Associated symptoms include a limited range of motion and tingling. Pertinent negatives include no fever, inability to bear weight, itching, joint locking, joint swelling, numbness or stiffness. The symptoms are aggravated by activity. She has tried rest for the symptoms. The treatment provided no relief.     Relevant past medical, surgical, family, and social history reviewed and updated as indicated.  Allergies and medications reviewed and updated. Data reviewed: Chart in Epic.   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   Deep incisional surgical site infection 07/01/2015   Diabetes mellitus without complication (HCC)    borderline; diet controlled   Dysrhythmia    Essential hypertension  07/26/2019   Family history of adverse reaction to anesthesia    Patients son has bad N/V   GAD (generalized anxiety disorder) 06/26/2017   Generalized edema 03/24/2017   GERD (gastroesophageal reflux disease)    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   Migraine    Palpitations 07/26/2019   Panic attack 06/26/2017   PONV (postoperative nausea and vomiting)    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   Staphylococcus aureus bacteremia with sepsis (HCC)    Wound infection 06/30/2015    Past Surgical History:  Procedure Laterality Date   ABDOMINAL HYSTERECTOMY     20s   BACK SURGERY     x4   BIOPSY  12/15/2022   Procedure: BIOPSY;  Surgeon: Lanelle Bal, DO;  Location: [facility];  Service: Endoscopy;;   BREAST SURGERY Bilateral    reduction   CHOLECYSTECTOMY     COLONOSCOPY WITH PROPOFOL N/A 12/15/2022   Procedure: COLONOSCOPY WITH PROPOFOL;  Surgeon: Lanelle Bal, DO;  Location: [facility];  Service: Endoscopy;  Laterality: N/A;  10:30 am, asa 3   ESOPHAGOGASTRODUODENOSCOPY (EGD) WITH PROPOFOL N/A 12/15/2022   Procedure: ESOPHAGOGASTRODUODENOSCOPY (EGD) WITH PROPOFOL;  Surgeon: Lanelle Bal, DO;  Location: [facility];  Service: Endoscopy;  Laterality: N/A;   LUMBAR LAMINECTOMY/DECOMPRESSION MICRODISCECTOMY N/A 06/30/2015   Procedure: wound exploration irrigation and debridement ,explantation of bone growth stimulator, placement of wound vac;  Surgeon: Loura Halt Ditty, MD;  Location: [facility];  Service: Neurosurgery;  Laterality:  N/A;   REDUCTION MAMMAPLASTY Bilateral 2004    Social History   Socioeconomic History   Marital status: Divorced    Spouse name: Not on file   Number of children: 1   Years of education: Not on file   Highest education level: Some college, no degree  Occupational History   Not on file  Tobacco Use   Smoking status: Former    Current packs/day: 0.00     Types: Cigarettes    Quit date: 06/02/2014    Years since quitting: 9.2   Smokeless tobacco: Never  Vaping Use   Vaping status: Never Used  Substance and Sexual Activity   Alcohol use: No   Drug use: Not Currently    Types: Marijuana    Comment: none since a teenager   Sexual activity: Not Currently    Birth control/protection: Surgical  Other Topics Concern   Not on file  Social History Narrative   Not on file   Social Drivers of Health   Financial Resource Strain: Not on file  Food Insecurity: No Food Insecurity (02/28/2022)   Hunger Vital Sign    Worried About Running Out of Food in the Last Year: Never true    Ran Out of Food in the Last Year: Never true  Transportation Needs: No Transportation Needs (02/28/2022)   PRAPARE - Administrator, Civil Service (Medical): No    Lack of Transportation (Non-Medical): No  Physical Activity: Not on file  Stress: Not on file  Social Connections: Not on file  Intimate Partner Violence: Not on file    Outpatient Encounter Medications as of 08/18/2023  Medication Sig   albuterol (VENTOLIN HFA) 108 (90 Base) MCG/ACT inhaler Inhale 1-2 puffs into the lungs every 6 (six) hours as needed for wheezing or shortness of breath.   azelastine (ASTELIN) 0.1 % nasal spray Place 1 spray into both nostrils 2 (two) times daily. Use in each nostril as directed   Blood Glucose Monitoring Suppl DEVI Check sugars up to 2 times daily. May substitute to any manufacturer covered by patient's insurance. E11.9   citalopram (CELEXA) 40 MG tablet Take 1 tablet (40 mg total) by mouth daily.   cyclobenzaprine (FLEXERIL) 10 MG tablet Take 1 tablet (10 mg total) by mouth 3 (three) times daily as needed for muscle spasms.   glucose blood (ACCU-CHEK GUIDE TEST) test strip Test BS BID Dx E11.9   Lancet Device MISC Check sugar up to 2 times daily. May substitute to any manufacturer covered by patient's insurance. E11.9   Lancets Misc. MISC Check sugar up to 2  times daily. May substitute to any manufacturer covered by patient's insurance.E11.9   lidocaine (ASPERCREME LIDOCAINE) 4 % Place 1 patch onto the skin daily as needed (pain).   methocarbamol (ROBAXIN) 750 MG tablet TAKE 1 TABLET BY MOUTH EVERY 8 HOURS AS NEEDED FOR MUSCLE SPASMS   metoprolol tartrate (LOPRESSOR) 50 MG tablet TAKE ONE (1) TABLET BY MOUTH TWO (2) TIMES DAILY   ondansetron (ZOFRAN-ODT) 4 MG disintegrating tablet Take 1 tablet (4 mg total) by mouth every 8 (eight) hours as needed for nausea or vomiting.   pantoprazole (PROTONIX) 40 MG tablet Take 1 tablet (40 mg total) by mouth 2 (two) times daily before a meal.   promethazine-dextromethorphan (PROMETHAZINE-DM) 6.25-15 MG/5ML syrup Take 5 mLs by mouth 4 (four) times daily as needed for cough.   rosuvastatin (CRESTOR) 10 MG tablet Take 1 tablet (10 mg total) by mouth daily. For cholesterol/ fatty  liver   SUMAtriptan (IMITREX) 50 MG tablet Take 1 tablet (50 mg total) by mouth daily as needed for migraine. May repeat in 4 hours if headache persists or recurs.   tirzepatide (MOUNJARO) 15 MG/0.5ML Pen Inject 15 mg into the skin once a week.   valACYclovir (VALTREX) 1000 MG tablet Take 2 tablets (2,000 mg total) by mouth 2 (two) times daily. X1 day per flare up of fever blister.   [DISCONTINUED] amoxicillin-clavulanate (AUGMENTIN) 875-125 MG tablet Take 1 tablet by mouth 2 (two) times daily for 10 days.   No facility-administered encounter medications on file as of 08/18/2023.    Allergies  Allergen Reactions   Codeine Itching and Rash   Latex Itching and Rash   Norco [Hydrocodone-Acetaminophen] Itching and Rash   Tape Itching and Rash    Review of Systems  Constitutional:  Positive for activity change. Negative for appetite change, chills, diaphoresis, fatigue, fever and unexpected weight change.  Respiratory:  Negative for cough and shortness of breath.   Cardiovascular:  Negative for chest pain, palpitations and leg swelling.   Musculoskeletal:  Positive for arthralgias, myalgias, neck pain and neck stiffness. Negative for back pain, gait problem, joint swelling and stiffness.  Skin:  Negative for itching.  Neurological:  Positive for tingling. Negative for dizziness, tremors, seizures, syncope, facial asymmetry, speech difficulty, weakness, light-headedness, numbness and headaches.  All other systems reviewed and are negative.       Objective:  BP 126/85   Pulse 93   Temp (!) 97.2 F (36.2 C)   Ht 5\' 7"  (1.702 m)   Wt 270 lb (122.5 kg)   LMP 02/24/2013   SpO2 97%   BMI 42.29 kg/m    Wt Readings from Last 3 Encounters:  08/18/23 270 lb (122.5 kg)  08/05/23 272 lb (123.4 kg)  07/25/23 260 lb (117.9 kg)    Physical Exam Vitals and nursing note reviewed.  Constitutional:      General: She is not in acute distress.    Appearance: Normal appearance. She is obese. She is not ill-appearing, toxic-appearing or diaphoretic.     Comments: Uncomfortable, guarding right shoulder  HENT:     Head: Normocephalic and atraumatic.     Nose: Nose normal.     Mouth/Throat:     Mouth: Mucous membranes are moist.  Eyes:     Conjunctiva/sclera: Conjunctivae normal.     Pupils: Pupils are equal, round, and reactive to light.  Neck:   Cardiovascular:     Rate and Rhythm: Normal rate and regular rhythm.     Pulses: Normal pulses.  Pulmonary:     Effort: Pulmonary effort is normal.     Breath sounds: Normal breath sounds.  Chest:    Musculoskeletal:     Right shoulder: Tenderness present. No swelling, deformity, effusion, laceration, bony tenderness or crepitus. Decreased range of motion. Normal strength. Normal pulse.     Right upper arm: Normal.     Cervical back: Spasms, torticollis and tenderness present. No swelling, edema, deformity, erythema, signs of trauma, lacerations, rigidity, bony tenderness or crepitus. Pain with movement and muscular tenderness present. No spinous process tenderness. Decreased  range of motion.     Thoracic back: Normal.  Neurological:     General: No focal deficit present.     Mental Status: She is alert and oriented to person, place, and time.     Results for orders placed or performed in visit on 07/06/23  [facility] Flu A/B [facility]  Time: 07/06/23  3:23 PM  Result Value Ref Range   Influenza A Negative Negative   Influenza B Negative Negative       Pertinent labs & imaging results that were available during my care of the patient were reviewed by me and considered in my medical decision making.  Assessment & Plan:  Demiana was seen today for shoulder pain.  Diagnoses and all orders for this visit:  Neck pain, chronic Cervical radiculopathy Muscle spasm Previous CT Cervical Spine reviewed. Spasm of trap noted upon physical exam. 40 mg DepoMedrol and 60 mg Toradol given in office as pt is in obvious discomfort. Hold Robaxin and use Flexeril for next 1-2 weeks. Red flags discussed in detail. Referral to neurosurgery placed. Aware to watch blood sugars over the next few days. Report new, worsening, or persistent symptoms.  -     cyclobenzaprine (FLEXERIL) 10 MG tablet; Take 1 tablet (10 mg total) by mouth 3 (three) times daily as needed for muscle spasms. -     Ambulatory referral to Neurosurgery     Continue all other maintenance medications.  Follow up plan: Return if symptoms worsen or fail to improve.   Continue healthy lifestyle choices, including diet (rich in fruits, vegetables, and lean proteins, and low in salt and simple carbohydrates) and exercise (at least 30 minutes of moderate physical activity daily).  Educational handout given for cervical radiculopathy   The above assessment and management plan was discussed with the patient. The patient verbalized understanding of and has agreed to the management plan. Patient is aware to call the clinic if they develop any new symptoms or if symptoms persist or worsen. Patient is aware  when to return to the clinic for a follow-up visit. Patient educated on when it is appropriate to go to the emergency department.   Kari Baars, FNP-C [facility] [phone]

## 2023-08-18 NOTE — Addendum Note (Signed)
 Addended by: Lorelee Cover C on: 08/18/2023 02:03 PM   Modules accepted: Orders

## 2023-09-14 ENCOUNTER — Ambulatory Visit (INDEPENDENT_AMBULATORY_CARE_PROVIDER_SITE_OTHER): Payer: Medicaid Other | Admitting: Family Medicine

## 2023-09-14 ENCOUNTER — Encounter: Payer: Self-pay | Admitting: Family Medicine

## 2023-09-14 VITALS — BP 127/84 | HR 86 | Temp 98.0°F | Ht 67.0 in | Wt 267.0 lb

## 2023-09-14 DIAGNOSIS — G43009 Migraine without aura, not intractable, without status migrainosus: Secondary | ICD-10-CM

## 2023-09-14 DIAGNOSIS — Z7985 Long-term (current) use of injectable non-insulin antidiabetic drugs: Secondary | ICD-10-CM | POA: Diagnosis not present

## 2023-09-14 DIAGNOSIS — I152 Hypertension secondary to endocrine disorders: Secondary | ICD-10-CM | POA: Diagnosis not present

## 2023-09-14 DIAGNOSIS — G894 Chronic pain syndrome: Secondary | ICD-10-CM

## 2023-09-14 DIAGNOSIS — E119 Type 2 diabetes mellitus without complications: Secondary | ICD-10-CM | POA: Diagnosis not present

## 2023-09-14 DIAGNOSIS — Z9889 Other specified postprocedural states: Secondary | ICD-10-CM

## 2023-09-14 DIAGNOSIS — E1159 Type 2 diabetes mellitus with other circulatory complications: Secondary | ICD-10-CM | POA: Diagnosis not present

## 2023-09-14 DIAGNOSIS — M549 Dorsalgia, unspecified: Secondary | ICD-10-CM

## 2023-09-14 DIAGNOSIS — M5416 Radiculopathy, lumbar region: Secondary | ICD-10-CM

## 2023-09-14 DIAGNOSIS — K219 Gastro-esophageal reflux disease without esophagitis: Secondary | ICD-10-CM

## 2023-09-14 LAB — BAYER DCA HB A1C WAIVED: HB A1C (BAYER DCA - WAIVED): 5.5 % (ref 4.8–5.6)

## 2023-09-14 MED ORDER — PANTOPRAZOLE SODIUM 40 MG PO TBEC: 40.0000 mg | DELAYED_RELEASE_TABLET | Freq: Two times a day (BID) | ORAL | 3 refills | Status: DC

## 2023-09-14 MED ORDER — SUMATRIPTAN SUCCINATE 50 MG PO TABS: 50.0000 mg | ORAL_TABLET | Freq: Every day | ORAL | 99 refills | Status: DC | PRN

## 2023-09-14 MED ORDER — METHOCARBAMOL 750 MG PO TABS: 750.0000 mg | ORAL_TABLET | Freq: Three times a day (TID) | ORAL | 0 refills | Status: AC | PRN

## 2023-09-14 NOTE — Progress Notes (Signed)
 Subjective: CC:DM PCP: Cheryl Ip, DO [id number] Cheryl Randall is a 48 y.o. female presenting to clinic today for:  1. Type 2 Diabetes with hypertension:  Patient is compliant with meds. No concerns today. She denies any hypoglycemic episodes.  [facility] [place].  Diabetes Health Maintenance Due  Topic Date Due   FOOT EXAM  Never done   OPHTHALMOLOGY EXAM  Never done   HEMOGLOBIN A1C  12/13/2023    Last A1c:  Lab Results  Component Value Date   HGBA1C 5.1 06/15/2023    ROS: Denies dizziness, LOC, polyuria, polydipsia, unintended weight loss/gain, foot ulcerations, numbness or tingling in extremities, shortness of breath or chest pain.   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   Deep incisional surgical site infection 07/01/2015   Diabetes mellitus without complication (HCC)    borderline; diet controlled   Dysrhythmia    Essential hypertension 07/26/2019   Family history of adverse reaction to anesthesia    Patients son has bad N/V   GAD (generalized anxiety disorder) 06/26/2017   Generalized edema 03/24/2017   GERD (gastroesophageal reflux disease)    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   Migraine    Palpitations 07/26/2019   Panic attack 06/26/2017   PONV (postoperative nausea and vomiting)    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   Staphylococcus aureus bacteremia with sepsis (HCC)    Wound infection 06/30/2015    Current Outpatient Medications:    albuterol (VENTOLIN HFA) 108 (90 Base) MCG/ACT inhaler, Inhale 1-2 puffs into the lungs every 6 (six)  hours as needed for wheezing or shortness of breath., Disp: 1 each, Rfl: 0   azelastine (ASTELIN) 0.1 % nasal spray, Place 1 spray into both nostrils 2 (two) times daily. Use in each nostril as directed, Disp: 30 mL, Rfl: 12   Blood Glucose Monitoring Suppl DEVI, Check sugars up to 2 times daily. May substitute to any manufacturer covered by patient's insurance. E11.9, Disp: 1 each, Rfl: 0   citalopram (CELEXA) 40 MG tablet, Take 1 tablet (40 mg total) by mouth daily., Disp: 90 tablet, Rfl: 3   cyclobenzaprine (FLEXERIL) 10 MG tablet, Take 1 tablet (10 mg total) by mouth 3 (three) times daily as needed for muscle spasms., Disp: 30 tablet, Rfl: 0   glucose blood (ACCU-CHEK GUIDE TEST) test strip, Test BS BID Dx E11.9, Disp: 200 each, Rfl: 3   Lancet Device MISC, Check sugar up to 2 times daily. May substitute to any manufacturer covered by patient's insurance. E11.9, Disp: 1 each, Rfl: 0   Lancets Misc. MISC, Check sugar up to 2 times daily. May substitute to any manufacturer covered by patient's insurance.E11.9, Disp: 100 each, Rfl: 3   lidocaine (ASPERCREME LIDOCAINE) 4 %, Place 1 patch onto the skin daily as needed (pain)., Disp: , Rfl:    methocarbamol (ROBAXIN) 750 MG tablet, TAKE 1 TABLET BY MOUTH EVERY 8 HOURS AS NEEDED FOR MUSCLE SPASMS, Disp: 90 tablet, Rfl: 0   metoprolol tartrate (LOPRESSOR) 50 MG tablet, TAKE ONE (1) TABLET BY MOUTH TWO (2) TIMES DAILY,  Disp: 180 tablet, Rfl: 3   ondansetron (ZOFRAN-ODT) 4 MG disintegrating tablet, Take 1 tablet (4 mg total) by mouth every 8 (eight) hours as needed for nausea or vomiting., Disp: 20 tablet, Rfl: 0   pantoprazole (PROTONIX) 40 MG tablet, Take 1 tablet (40 mg total) by mouth 2 (two) times daily before a meal., Disp: 90 tablet, Rfl: 3   rosuvastatin (CRESTOR) 10 MG tablet, Take 1 tablet (10 mg total) by mouth daily. For cholesterol/ fatty liver, Disp: 90 tablet, Rfl: 3   SUMAtriptan (IMITREX) 50 MG tablet, Take 1 tablet (50 mg total) by mouth  daily as needed for migraine. May repeat in 4 hours if headache persists or recurs., Disp: 6 tablet, Rfl: PRN   tirzepatide (MOUNJARO) 15 MG/0.5ML Pen, Inject 15 mg into the skin once a week., Disp: 6 mL, Rfl: 3   valACYclovir (VALTREX) 1000 MG tablet, Take 2 tablets (2,000 mg total) by mouth 2 (two) times daily. X1 day per flare up of fever blister., Disp: 20 tablet, Rfl: 0 Social History   Socioeconomic History   Marital status: Divorced    Spouse name: Not on file   Number of children: 1   Years of education: Not on file   Highest education level: Some college, no degree  Occupational History   Not on file  Tobacco Use   Smoking status: Former    Current packs/day: 0.00    Types: Cigarettes    Quit date: 06/02/2014    Years since quitting: 9.2   Smokeless tobacco: Never  Vaping Use   Vaping status: Never Used  Substance and Sexual Activity   Alcohol use: No   Drug use: Not Currently    Types: Marijuana    Comment: none since a teenager   Sexual activity: Not Currently    Birth control/protection: Surgical  Other Topics Concern   Not on file  Social History Narrative   Not on file   Social Drivers of Health   Financial Resource Strain: Not on file  Food Insecurity: No Food Insecurity (02/28/2022)   Hunger Vital Sign    Worried About Running Out of Food in the Last Year: Never true    Ran Out of Food in the Last Year: Never true  Transportation Needs: No Transportation Needs (02/28/2022)   PRAPARE - Administrator, Civil Service (Medical): No    Lack of Transportation (Non-Medical): No  Physical Activity: Not on file  Stress: Not on file  Social Connections: Not on file  Intimate Partner Violence: Not on file   Family History  Problem Relation Age of Onset   Heart disease Mother    Diabetes Mother    Cancer Father        bladder   Autism Brother    Diabetes Brother    Heart disease Brother    Diabetes Brother    Diabetes Brother    Breast cancer  Maternal Aunt        double mastectomy    Objective: Office vital signs reviewed. BP 127/84   Pulse 86   Temp 98 F (36.7 C)   Ht 5\' 7"  (1.702 m)   Wt 267 lb (121.1 kg)   LMP 02/24/2013   SpO2 95%   BMI 41.82 kg/m   Physical Examination:  General: Awake, alert, well nourished, No acute distress HEENT: sclera white, MMM Cardio: regular rate and rhythm, S1S2 heard, no murmurs appreciated Pulm: clear to auscultation bilaterally, no wheezes, rhonchi or rales; normal work  of breathing on room air  Assessment/ Plan: 48 y.o. female   Diabetes mellitus treated with injections of non-insulin medication (HCC) - Plan: Microalbumin / creatinine urine ratio, Bayer DCA Hb A1c Waived  Hypertension associated with diabetes (HCC) - Plan: Microalbumin / creatinine urine ratio  Migraine without aura and without status migrainosus, not intractable - Plan: SUMAtriptan (IMITREX) 50 MG tablet  Lumbar radiculopathy - Plan: methocarbamol (ROBAXIN) 750 MG tablet  Back pain with history of spinal surgery - Plan: methocarbamol (ROBAXIN) 750 MG tablet  Chronic pain syndrome - Plan: methocarbamol (ROBAXIN) 750 MG tablet  Gastroesophageal reflux disease without esophagitis - Plan: pantoprazole (PROTONIX) 40 MG tablet  Sugar under excellent control.  No changes needed.  Refills of all medications have been sent.  Urine microalbumin collected.  Plan for diabetic foot exam at next visit  We did not discuss her migraine or chronic pain during today's visit but needed refills all meds have been renewed  Cheryl Trejos Bambi Bonine, DO [facility] [phone]

## 2023-09-15 ENCOUNTER — Ambulatory Visit: Payer: Medicaid Other | Admitting: Nutrition

## 2023-09-15 ENCOUNTER — Encounter: Payer: Self-pay | Admitting: Family Medicine

## 2023-09-15 DIAGNOSIS — R3916 Straining to void: Secondary | ICD-10-CM

## 2023-09-15 LAB — MICROALBUMIN / CREATININE URINE RATIO
Creatinine, Urine: 155.4 mg/dL
Microalb/Creat Ratio: 3 mg/g{creat} (ref 0–29)
Microalbumin, Urine: 5.1 ug/mL

## 2023-09-30 ENCOUNTER — Ambulatory Visit: Admitting: Nutrition

## 2023-10-01 ENCOUNTER — Ambulatory Visit (INDEPENDENT_AMBULATORY_CARE_PROVIDER_SITE_OTHER): Admitting: Family Medicine

## 2023-10-01 ENCOUNTER — Encounter: Payer: Self-pay | Admitting: Family Medicine

## 2023-10-01 ENCOUNTER — Ambulatory Visit: Payer: Self-pay

## 2023-10-01 VITALS — BP 99/62 | HR 84 | Temp 97.4°F | Ht 67.0 in | Wt 266.0 lb

## 2023-10-01 DIAGNOSIS — R300 Dysuria: Secondary | ICD-10-CM | POA: Diagnosis not present

## 2023-10-01 DIAGNOSIS — N12 Tubulo-interstitial nephritis, not specified as acute or chronic: Secondary | ICD-10-CM | POA: Diagnosis not present

## 2023-10-01 LAB — URINALYSIS, ROUTINE W REFLEX MICROSCOPIC
Bilirubin, UA: NEGATIVE
Glucose, UA: NEGATIVE
Nitrite, UA: POSITIVE — AB
Specific Gravity, UA: 1.025 (ref 1.005–1.030)
Urobilinogen, Ur: 0.2 mg/dL (ref 0.2–1.0)
pH, UA: 6 (ref 5.0–7.5)

## 2023-10-01 LAB — MICROSCOPIC EXAMINATION
Renal Epithel, UA: NONE SEEN /HPF
Yeast, UA: NONE SEEN

## 2023-10-01 MED ORDER — CIPROFLOXACIN HCL 500 MG PO TABS: 500.0000 mg | ORAL_TABLET | Freq: Two times a day (BID) | ORAL | 0 refills | Status: DC

## 2023-10-01 NOTE — Progress Notes (Signed)
 Subjective:  Patient ID: Cheryl Randall, female    DOB: 1976-03-07  Age: 48 y.o. MRN: [id number]  CC: Urinary Tract Infection (Started 2 days ago. Urge to urinating and dysuria. Too diflucan  yesterday. Dark urine. A little lower back and lower abdominal pain. )   HPI Cheryl Randall presents for burning with urination and frequency for several days. Denies fever . No flank pain. No nausea, vomiting.      10/01/2023    3:21 PM 10/01/2023    3:20 PM 09/14/2023    1:19 PM  Depression screen PHQ 2/9  Decreased Interest 0 0 0  Down, Depressed, Hopeless 0 0 0  PHQ - 2 Score 0 0 0  Altered sleeping 0 0   Tired, decreased energy 0 0   Change in appetite 0 0   Feeling bad or failure about yourself  0 0   Trouble concentrating 0 0   Moving slowly or fidgety/restless 0 0   Suicidal thoughts 0 0   PHQ-9 Score 0 0   Difficult doing work/chores Not difficult at all Not difficult at all     History Cheryl Randall has a past medical history of Anxiety, Chronic pain syndrome (11/25/2016), DDD (degenerative disc disease), DDD (degenerative disc disease), lumbosacral (11/24/2016), Deep incisional surgical site infection (07/01/2015), Diabetes mellitus without complication (HCC), Dysrhythmia, Essential hypertension (07/26/2019), Family history of adverse reaction to anesthesia, GAD (generalized anxiety disorder) (06/26/2017), Generalized edema (03/24/2017), GERD (gastroesophageal reflux disease), 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HCC) (08/21/2017), Irritable bowel syndrome with diarrhea (03/24/2017), Migraine, Palpitations (07/26/2019), Panic attack (06/26/2017), PONV (postoperative nausea and vomiting), Pseudoarthrosis of lumbar spine (05/31/2015), Staphylococcus aureus bacteremia with sepsis (HCC), and Wound infection (06/30/2015).   She has a past surgical history that includes Cholecystectomy; Back surgery; Breast surgery (Bilateral); Lumbar laminectomy/decompression microdiscectomy (N/A, 06/30/2015); Abdominal  hysterectomy; Colonoscopy with propofol  (N/A, 12/15/2022); Esophagogastroduodenoscopy (egd) with propofol  (N/A, 12/15/2022); biopsy (12/15/2022); and Reduction mammaplasty (Bilateral, 2004).   Her family history includes Autism in her brother; Breast cancer in her maternal aunt; Cancer in her father; Diabetes in her brother, brother, brother, and mother; Heart disease in her brother and mother.She reports that she quit smoking about 9 years ago. Her smoking use included cigarettes. She has never used smokeless tobacco. She reports that she does not currently use drugs after having used the following drugs: Marijuana. She reports that she does not drink alcohol.    ROS Review of Systems  Constitutional:  Negative for chills, diaphoresis and fever.  HENT:  Negative for congestion.   Eyes:  Negative for visual disturbance.  Respiratory:  Negative for cough and shortness of breath.   Cardiovascular:  Negative for chest pain and palpitations.  Gastrointestinal:  Negative for constipation, diarrhea and nausea.  Genitourinary:  Positive for dysuria, flank pain (moderate at left), frequency and urgency. Negative for decreased urine volume, hematuria, menstrual problem and pelvic pain.  Musculoskeletal:  Negative for arthralgias and joint swelling.  Skin:  Negative for rash.  Neurological:  Negative for dizziness and numbness.    Objective:  BP 99/62   Pulse 84   Temp (!) 97.4 F (36.3 C)   Ht 5\' 7"  (1.702 m)   Wt 266 lb (120.7 kg)   LMP 02/24/2013   SpO2 98%   BMI 41.66 kg/m   BP Readings from Last 3 Encounters:  10/01/23 99/62  09/14/23 127/84  08/18/23 126/85    Wt Readings from Last 3 Encounters:  10/01/23 266 lb (120.7 kg)  09/14/23 267 lb (  121.1 kg)  08/18/23 270 lb (122.5 kg)     Physical Exam Constitutional:      Appearance: She is well-developed.  HENT:     Head: Normocephalic and atraumatic.  Cardiovascular:     Rate and Rhythm: Normal rate and regular rhythm.      Heart sounds: No murmur heard. Pulmonary:     Effort: Pulmonary effort is normal.     Breath sounds: Normal breath sounds.  Abdominal:     General: Bowel sounds are normal.     Palpations: Abdomen is soft. There is no mass.     Tenderness: There is no abdominal tenderness. There is no guarding or rebound.  Musculoskeletal:        General: Tenderness (left flank) present.  Skin:    General: Skin is warm and dry.  Neurological:     Mental Status: She is alert and oriented to person, place, and time.  Psychiatric:        Behavior: Behavior normal.      Assessment & Plan:  Pyelonephritis -     Urinalysis, Routine w reflex microscopic -     Urine Culture  Other orders -     Ciprofloxacin  HCl; Take 1 tablet (500 mg total) by mouth 2 (two) times daily.  Dispense: 20 tablet; Refill: 0     Follow-up: Return if symptoms worsen or fail to improve.  Cheryl Randall, M.D.

## 2023-10-01 NOTE — Telephone Encounter (Signed)
 E2C2 scheduled appointment.

## 2023-10-01 NOTE — Telephone Encounter (Signed)
  Chief Complaint: painful urination Symptoms: moderate pain, low back pain, urgency Frequency: 2 days  Pertinent Negatives: Patient denies fever Disposition: [] ED /[] Urgent Care (no appt availability in office) / [x] Appointment(In office/virtual)/ []  [facility]/ [] Home Care/ [] Refused Recommended Disposition /[] [facility]/ []  Follow-up with PCP Additional Notes:  Copied from CRM [id number]. Topic: Clinical - Red Word Triage >> Oct 01, 2023 11:09 AM Adrianna P wrote: Red Word that prompted transfer to Nurse Triage: possible uti, pain and burning when urinating has been going on a few days Reason for Disposition  All other patients with painful urination  (Exception: [1] EITHER frequency or urgency AND [2] has on-call doctor.)  Answer Assessment - Initial Assessment Questions 1. SEVERITY: "How bad is the pain?"  (e.g., Scale 1-10; mild, moderate, or severe)   - MILD (1-3): complains slightly about urination hurting   - MODERATE (4-7): interferes with normal activities     - SEVERE (8-10): excruciating, unwilling or unable to urinate because of the pain      moderate 2. FREQUENCY: "How many times have you had painful urination today?"      eerytime 3. PATTERN: "Is pain present every time you urinate or just sometimes?"      Everyt ime  4. ONSET: "When did the painful urination start?"      2  days  5. FEVER: "Do you have a fever?" If Yes, ask: "What is your temperature, how was it measured, and when did it start?"     no 7. CAUSE: "What do you think is causing the painful urination?"  (e.g., UTI, scratch, Herpes sore)     UTI 8. OTHER SYMPTOMS: "Do you have any other symptoms?" (e.g., blood in urine, flank pain, genital sores, urgency, vaginal discharge)     Low back pain- urgency,  Protocols used: Urination Pain - Female-A-AH

## 2023-10-05 ENCOUNTER — Encounter: Payer: Self-pay | Admitting: Family Medicine

## 2023-10-05 LAB — URINE CULTURE

## 2023-10-06 ENCOUNTER — Ambulatory Visit: Admitting: Nutrition

## 2023-10-08 ENCOUNTER — Ambulatory Visit: Admitting: Nutrition

## 2023-10-14 LAB — OPHTHALMOLOGY REPORT-SCANNED

## 2023-11-16 ENCOUNTER — Telehealth: Payer: Self-pay | Admitting: Family Medicine

## 2023-11-16 ENCOUNTER — Telehealth

## 2023-11-16 NOTE — Telephone Encounter (Unsigned)
 Copied from CRM [id number]. Topic: Clinical - Medical Advice >> Nov 16, 2023 12:36 PM Sasha H wrote: Reason for CRM: Pt is wanting to know if she can bring a urine sample by, believes she is getting a UTI . States she has a lot of pain and pressure.

## 2023-11-16 NOTE — Telephone Encounter (Signed)
 Appt scheduled for tomorrow.

## 2023-11-17 ENCOUNTER — Ambulatory Visit

## 2023-11-17 ENCOUNTER — Telehealth: Admitting: Family Medicine

## 2023-11-17 ENCOUNTER — Encounter: Payer: Self-pay | Admitting: Family Medicine

## 2023-11-17 DIAGNOSIS — F411 Generalized anxiety disorder: Secondary | ICD-10-CM | POA: Diagnosis not present

## 2023-11-17 DIAGNOSIS — Z87440 Personal history of urinary (tract) infections: Secondary | ICD-10-CM | POA: Diagnosis not present

## 2023-11-17 DIAGNOSIS — K640 First degree hemorrhoids: Secondary | ICD-10-CM

## 2023-11-17 LAB — URINALYSIS, ROUTINE W REFLEX MICROSCOPIC
Bilirubin, UA: NEGATIVE
Glucose, UA: NEGATIVE
Nitrite, UA: POSITIVE — AB
Specific Gravity, UA: >1.03 — ABNORMAL HIGH (ref 1.005–1.030)
Urobilinogen, Ur: 0.2 mg/dL (ref 0.2–1.0)
pH, UA: 5.5 (ref 5.0–7.5)

## 2023-11-17 LAB — MICROSCOPIC EXAMINATION
Renal Epithel, UA: NONE SEEN /HPF
WBC, UA: >30 /HPF — AB (ref 0–5)
Yeast, UA: NONE SEEN

## 2023-11-17 MED ORDER — CEPHALEXIN 500 MG PO CAPS: 500.0000 mg | ORAL_CAPSULE | Freq: Three times a day (TID) | ORAL | 0 refills | Status: AC

## 2023-11-17 MED ORDER — HYDROCORTISONE (PERIANAL) 2.5 % EX CREA: 1.0000 | TOPICAL_CREAM | Freq: Two times a day (BID) | CUTANEOUS | 0 refills | Status: AC | PRN

## 2023-11-17 MED ORDER — FLUCONAZOLE 150 MG PO TABS: 150.0000 mg | ORAL_TABLET | Freq: Once | ORAL | 0 refills | Status: AC

## 2023-11-17 MED ORDER — BUSPIRONE HCL 7.5 MG PO TABS: 7.5000 mg | ORAL_TABLET | Freq: Three times a day (TID) | ORAL | 0 refills | Status: DC

## 2023-11-17 NOTE — Progress Notes (Signed)
 MyChart Video visit  Subjective: CC:UTi PCP: Eliodoro Guerin, DO ZOX:WRUEA D [name] is a 48 y.o. female. Patient provides verbal consent for consult held via video.  Due to COVID-19 pandemic this visit was conducted virtually. This visit type was conducted due to national recommendations for restrictions regarding the COVID-19 Pandemic (e.g. social distancing, sheltering in place) in an effort to limit this patient's exposure and mitigate transmission in our community. All issues noted in this document were discussed and addressed.  A physical exam was not performed with this format.   Location of patient: [place] manager of provider: [facility] Others present for call: none  1.  UTI She reports onset of dysuria that started yesterday.  She just had one in May.  She has been drinking water  but probably not enough.  No hematuria, fevers, nausea.    2.  Anxiety She reports some breakthrough anxiety that is not controlled by Celexa  40 mg.  She has been on this pill for years.  Has not tried BuSpar  but we willing to try  3.  Hemorrhoids She reports that she is also getting some constipation that is probably from the Mounjaro  use.  She has increased her water  and is started adding fiber and MiraLAX  and so far bowel movements are improving.  ROS: Per HPI  Allergies  Allergen Reactions   Codeine Itching and Rash   Latex Itching and Rash   Norco [Hydrocodone -Acetaminophen ] Itching and Rash   Tape Itching and Rash   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   Deep incisional surgical site infection 07/01/2015   Diabetes mellitus without complication (HCC)    borderline; diet controlled   Dysrhythmia    Essential hypertension 07/26/2019   Family history of adverse reaction to anesthesia    Patients son has bad N/V   GAD (generalized anxiety  disorder) 06/26/2017   Generalized edema 03/24/2017   GERD (gastroesophageal reflux disease)    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   Migraine    Palpitations 07/26/2019   Panic attack 06/26/2017   PONV (postoperative nausea and vomiting)    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 Use in each nostril as directed, Disp: 30 mL, Rfl: 12   Blood Glucose Monitoring Suppl DEVI, Check sugars up to 2 times daily. May substitute to any manufacturer covered by patient's insurance. E11.9, Disp: 1 each, Rfl: 0   ciprofloxacin  (CIPRO ) 500 MG tablet, Take 1 tablet (500 mg total) by mouth 2 (two) times daily., Disp: 20 tablet, Rfl: 0   citalopram  (CELEXA ) 40 MG tablet, Take 1 tablet (40 mg total) by mouth daily., Disp: 90 tablet, Rfl: 3   glucose blood (ACCU-CHEK GUIDE TEST) test strip, Test BS BID Dx E11.9, Disp: 200 each, Rfl: 3   Lancet Device MISC, Check sugar up to 2 times daily. May substitute to any manufacturer covered by patient's insurance. E11.9, Disp: 1 each,  Rfl: 0   Lancets Misc. MISC, Check sugar up to 2 times daily. May substitute to any manufacturer covered by patient's insurance.E11.9, Disp: 100 each, Rfl: 3   lidocaine  (ASPERCREME LIDOCAINE ) 4 %, Place 1 patch onto the skin daily as needed (pain)., Disp: , Rfl:    methocarbamol  (ROBAXIN ) 750 MG tablet, Take 1 tablet (750 mg total) by mouth every 8 (eight) hours as needed for muscle spasms., Disp: 90 tablet, Rfl: 0   metoprolol  tartrate (LOPRESSOR ) 50 MG tablet, TAKE ONE (1) TABLET BY MOUTH  TWO (2) TIMES DAILY, Disp: 180 tablet, Rfl: 3   ondansetron  (ZOFRAN -ODT) 4 MG disintegrating tablet, Take 1 tablet (4 mg total) by mouth every 8 (eight) hours as needed for nausea or vomiting., Disp: 20 tablet, Rfl: 0   pantoprazole  (PROTONIX ) 40 MG tablet, Take 1 tablet (40 mg total) by mouth 2 (two) times daily before a meal., Disp: 90 tablet, Rfl: 3   rosuvastatin  (CRESTOR ) 10 MG tablet, Take 1 tablet (10 mg total) by mouth daily. For cholesterol/ fatty liver, Disp: 90 tablet, Rfl: 3   SUMAtriptan  (IMITREX ) 50 MG tablet, Take 1 tablet (50 mg total) by mouth daily as needed for migraine. May repeat in 4 hours if headache persists or recurs., Disp: 6 tablet, Rfl: PRN   tirzepatide  (MOUNJARO ) 15 MG/0.5ML Pen, Inject 15 mg into the skin once a week., Disp: 6 mL, Rfl: 3   valACYclovir  (VALTREX ) 1000 MG tablet, Take 2 tablets (2,000 mg total) by mouth 2 (two) times daily. X1 day per flare up of fever blister., Disp: 20 tablet, Rfl: 0 Gen: well appearing female, NAD Psych: mood stable, speech normal affect appropriate   Assessment/ Plan: 48 y.o. female   Recent urinary tract infection - Plan: Urinalysis, Routine w reflex microscopic, Urine Culture, cephALEXin  (KEFLEX ) 500 MG capsule, fluconazole  (DIFLUCAN ) 150 MG tablet  Generalized anxiety disorder - Plan: busPIRone  (BUSPAR ) 7.5 MG tablet  Grade I hemorrhoids - Plan: hydrocortisone  (ANUSOL -HC) 2.5 % rectal cream  Something she has recurrent UTI.  Again reinforced need for water  intake.  I reviewed her last culture which was pansensitive.  Keflex  3 times daily sent for 7 days.  I will again as needed.  Trial of BuSpar  for breakthrough anxiety not controlled by SSRI.  Anusol  sent for as needed use for hemorrhoids.  Reinforced need for soft bowel movements that she has started MiraLAX  regimen.  Start time: 11:33a End time: 11:40a  Total time spent on patient care (including video visit/ documentation): 11 minutes  Cheryl Randall Cheryl Bonine,  DO [facility] [phone]

## 2023-11-18 ENCOUNTER — Ambulatory Visit: Payer: Self-pay | Admitting: Family Medicine

## 2023-11-20 LAB — URINE CULTURE

## 2023-12-18 ENCOUNTER — Other Ambulatory Visit (HOSPITAL_COMMUNITY): Payer: Self-pay

## 2023-12-24 ENCOUNTER — Ambulatory Visit: Admitting: Family Medicine

## 2023-12-24 ENCOUNTER — Telehealth: Payer: Self-pay | Admitting: Family Medicine

## 2023-12-24 VITALS — BP 104/73 | HR 74 | Temp 97.6°F | Ht 67.0 in | Wt 271.0 lb

## 2023-12-24 DIAGNOSIS — J069 Acute upper respiratory infection, unspecified: Secondary | ICD-10-CM

## 2023-12-24 MED ORDER — GUAIFENESIN-CODEINE 100-10 MG/5ML PO SOLN: 5.0000 mL | Freq: Three times a day (TID) | ORAL | 0 refills | Status: DC | PRN

## 2023-12-24 MED ORDER — PSEUDOEPHEDRINE-CODEINE-GG 30-10-100 MG/5ML PO SOLN: 10.0000 mL | Freq: Four times a day (QID) | ORAL | 0 refills | Status: DC | PRN

## 2023-12-24 NOTE — Telephone Encounter (Signed)
 Patient aware and verbalized understanding.

## 2023-12-24 NOTE — Telephone Encounter (Signed)
 Rx sent.

## 2023-12-24 NOTE — Telephone Encounter (Signed)
 Copied from CRM [id number]. Topic: Clinical - Medication Question >> Dec 24, 2023  3:20 PM Paige D wrote: Reason for CRM: Robitussin [facility] they no longer have it, pharmacy needs a new script for  Robitussin AC. The pharmacy is [facility] in [place].

## 2023-12-24 NOTE — Progress Notes (Signed)
 Acute Office Visit  Subjective:     Patient ID: Cheryl Randall, female    DOB: 09/19/1975, 48 y.o.   MRN: [id number]  Chief Complaint  Patient presents with   Nasal Congestion    URI  This is a new problem. Episode onset: 3 days. There has been no fever. Associated symptoms include congestion, coughing, headaches, rhinorrhea, sinus pain (maxillary, frontal) and sneezing. Pertinent negatives include no abdominal pain, chest pain, diarrhea, dysuria, ear pain, joint swelling, nausea, sore throat or vomiting. Associated symptoms comments: myalgias. She has tried decongestant for the symptoms. The treatment provided no relief.   Sick contacts. Covid at church.   Review of Systems  HENT:  Positive for congestion, rhinorrhea, sinus pain (maxillary, frontal) and sneezing. Negative for ear pain and sore throat.   Respiratory:  Positive for cough.   Cardiovascular:  Negative for chest pain.  Gastrointestinal:  Negative for abdominal pain, diarrhea, nausea and vomiting.  Genitourinary:  Negative for dysuria.  Neurological:  Positive for headaches.        Objective:    BP 104/73   Pulse 74   Temp 97.6 F (36.4 C) (Temporal)   Ht 5' 7 (1.702 m)   Wt 271 lb (122.9 kg)   LMP 02/24/2013   SpO2 96%   BMI 42.44 kg/m    Physical Exam Vitals and nursing note reviewed.  Constitutional:      General: She is not in acute distress.    Appearance: She is ill-appearing. She is not toxic-appearing or diaphoretic.  HENT:     Head: Normocephalic and atraumatic.     Right Ear: Ear canal and external ear normal. A middle ear effusion is present. Tympanic membrane is not perforated, erythematous, retracted or bulging.     Left Ear: Ear canal and external ear normal. A middle ear effusion is present. Tympanic membrane is not perforated, erythematous, retracted or bulging.     Nose: Congestion present.     Right Sinus: Maxillary sinus tenderness and frontal sinus tenderness present.     Left  Sinus: Maxillary sinus tenderness and frontal sinus tenderness present.     Mouth/Throat:     Mouth: Mucous membranes are moist.     Pharynx: Oropharynx is clear. No oropharyngeal exudate or posterior oropharyngeal erythema.     Tonsils: No tonsillar exudate or tonsillar abscesses. 0 on the right.  Eyes:     General:        Right eye: No discharge.        Left eye: No discharge.     Conjunctiva/sclera: Conjunctivae normal.  Cardiovascular:     Rate and Rhythm: Normal rate and regular rhythm.     Heart sounds: No murmur heard. Pulmonary:     Effort: Pulmonary effort is normal. No respiratory distress.     Breath sounds: Normal breath sounds. No wheezing, rhonchi or rales.  Abdominal:     General: There is no distension.     Palpations: Abdomen is soft.  Musculoskeletal:     Cervical back: Neck supple. No rigidity.     Right lower leg: No edema.     Left lower leg: No edema.  Lymphadenopathy:     Cervical: No cervical adenopathy.  Skin:    General: Skin is warm and dry.  Neurological:     General: No focal deficit present.     Mental Status: She is alert and oriented to person, place, and time.  Psychiatric:  Mood and Affect: Mood normal.     No results found for any visits on 12/24/23.      Assessment & Plan:   Talitha was seen today for nasal congestion.  Diagnoses and all orders for this visit:  Viral URI with cough Covid test pending. Discussed viral etiology. Will notify patient of Covid result when available. Discussed antibiotic if symptoms do not improve or worsen after 7 days from start. Discussed symptomatic care and return precautions.  -     Novel Coronavirus, NAA ([facility]) -     Discontinue: pseudoephedrine -codeine -guaifenesin  (MYTUSSIN DAC) 30-10-100 MG/5ML solution; Take 10 mLs by mouth 4 (four) times daily as needed for cough.  The patient indicates understanding of these issues and agrees with the plan.  Annabella CHRISTELLA Search, FNP

## 2023-12-26 LAB — NOVEL CORONAVIRUS, NAA: SARS-CoV-2, NAA: NOT DETECTED

## 2023-12-28 ENCOUNTER — Other Ambulatory Visit: Payer: Self-pay | Admitting: Family Medicine

## 2023-12-28 ENCOUNTER — Ambulatory Visit: Payer: Self-pay

## 2023-12-28 ENCOUNTER — Encounter: Payer: Self-pay | Admitting: Family Medicine

## 2023-12-28 ENCOUNTER — Ambulatory Visit: Payer: Self-pay | Admitting: Family Medicine

## 2023-12-28 DIAGNOSIS — J019 Acute sinusitis, unspecified: Secondary | ICD-10-CM

## 2023-12-28 MED ORDER — AMOXICILLIN 875 MG PO TABS: 875.0000 mg | ORAL_TABLET | Freq: Two times a day (BID) | ORAL | 0 refills | Status: AC

## 2023-12-28 NOTE — Telephone Encounter (Signed)
 Separate message sent to provider regarding persistent symptoms.

## 2023-12-28 NOTE — Telephone Encounter (Signed)
 FYI Only or Action Required?: FYI only for provider.  Patient was last seen in primary care on 12/24/2023 by Joesph Annabella HERO, FNP.  Called Nurse Triage reporting sinus congestion.  Symptoms began a week ago.  Interventions attempted: Rest, hydration, or home remedies.  Symptoms are: unchanged.  Triage Disposition: See PCP When Office is Open (Within 3 Days)-patient is requesting medication be sent in to her pharmacy. Patient did message the provider she saw on Mychart.   Patient/caregiver understands and will follow disposition?: No, wishes to speak with PCP  Reason for Disposition  [1] Sinus congestion (pressure, fullness) AND [2] present > 10 days  Answer Assessment - Initial Assessment Questions 1. LOCATION: Where does it hurt?      Under eyes and across the bridge of the nose 2. ONSET: When did the sinus pain start?  (e.g., hours, days)      Started last Wednesday 3. SEVERITY: How bad is the pain?   (Scale 0-10; or none, mild, moderate or severe)     5 out of 10 4. RECURRENT SYMPTOM: Have you ever had sinus problems before? If Yes, ask: When was the last time? and What happened that time?      yes 5. NASAL CONGESTION: Is the nose blocked? If Yes, ask: Can you open it or must you breathe through your mouth?     Patient can breath through her nose.  6. NASAL DISCHARGE: Do you have discharge from your nose? If so ask, What color?     No discharge 7. FEVER: Do you have a fever? If Yes, ask: What is it, how was it measured, and when did it start?      no 8. OTHER SYMPTOMS: Do you have any other symptoms? (e.g., sore throat, cough, earache, difficulty breathing)     Cough, congestion, earache.  Patient called back after three attempts made by previous Rns. Patient was seen in office by ONEIDA Joesph on 7/24. Patient calling back due to continued symptoms. Patient was told that to call back for medications if symptoms continued. Patient is asking for a call  back in regard to medication.  Protocols used: Sinus Pain or Congestion-A-AH

## 2023-12-28 NOTE — Telephone Encounter (Signed)
 Duplicate call.

## 2023-12-28 NOTE — Telephone Encounter (Addendum)
 First attempt; no answer Second attempt; no answer      Copied from CRM [id number]. Topic: Clinical - Medical Advice >> Dec 28, 2023  8:24 AM Merlynn A wrote: Reason for CRM: Patient called in requesting for antibiotics to be sent to pharmacy for her. Please send medication to pharmacy for patient if any additional questions please contact patient at [phone].

## 2023-12-28 NOTE — Telephone Encounter (Signed)
 Third attempt to contact pt for triage, no answer, LVM for call back to [facility]. Forwarding to office for follow up.

## 2024-01-01 ENCOUNTER — Encounter: Payer: Self-pay | Admitting: Family Medicine

## 2024-01-01 ENCOUNTER — Ambulatory Visit: Admitting: Family Medicine

## 2024-01-01 VITALS — BP 117/76 | HR 75 | Temp 97.3°F | Ht 67.0 in | Wt 265.0 lb

## 2024-01-01 DIAGNOSIS — E1169 Type 2 diabetes mellitus with other specified complication: Secondary | ICD-10-CM | POA: Diagnosis not present

## 2024-01-01 DIAGNOSIS — E785 Hyperlipidemia, unspecified: Secondary | ICD-10-CM

## 2024-01-01 DIAGNOSIS — E119 Type 2 diabetes mellitus without complications: Secondary | ICD-10-CM

## 2024-01-01 DIAGNOSIS — Z7985 Long-term (current) use of injectable non-insulin antidiabetic drugs: Secondary | ICD-10-CM

## 2024-01-01 DIAGNOSIS — E1159 Type 2 diabetes mellitus with other circulatory complications: Secondary | ICD-10-CM | POA: Diagnosis not present

## 2024-01-01 DIAGNOSIS — I152 Hypertension secondary to endocrine disorders: Secondary | ICD-10-CM

## 2024-01-01 LAB — BASIC METABOLIC PANEL WITH GFR
BUN/Creatinine Ratio: 12 (ref 9–23)
BUN: 10 mg/dL (ref 6–24)
CO2: 22 mmol/L (ref 20–29)
Calcium: 9.3 mg/dL (ref 8.7–10.2)
Chloride: 102 mmol/L (ref 96–106)
Creatinine, Ser: 0.81 mg/dL (ref 0.57–1.00)
Glucose: 90 mg/dL (ref 70–99)
Potassium: 4.2 mmol/L (ref 3.5–5.2)
Sodium: 138 mmol/L (ref 134–144)
eGFR: 89 mL/min/1.73 (ref >59)

## 2024-01-01 LAB — BAYER DCA HB A1C WAIVED: HB A1C (BAYER DCA - WAIVED): 5.2 % (ref 4.8–5.6)

## 2024-01-01 NOTE — Progress Notes (Signed)
 Subjective: CC:DM PCP: Cheryl Norene HERO, DO Cheryl Randall is a 48 y.o. female presenting to clinic today for:  1. Type 2 Diabetes with HTN, hyperlipidemia:  Compliant with Mounjaro  15 mg weekly and rosuvastatin  daily.  She feels like her weight loss has lost momentum.  Diabetes Health Maintenance Due  Topic Date Due   FOOT EXAM  Never done   OPHTHALMOLOGY EXAM  Never done   HEMOGLOBIN A1C  03/15/2024    Last A1c:  Lab Results  Component Value Date   HGBA1C 5.5 09/14/2023    ROS: Denies dizziness, LOC, polyuria, polydipsia, unintended weight loss/gain, foot ulcerations, numbness or tingling in extremities, shortness of breath or chest pain.   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   Deep incisional surgical site infection 07/01/2015   Diabetes mellitus without complication (HCC)    borderline; diet controlled   Dysrhythmia    Essential hypertension 07/26/2019   Family history of adverse reaction to anesthesia    Patients son has bad N/V   GAD (generalized anxiety disorder) 06/26/2017   Generalized edema 03/24/2017   GERD (gastroesophageal reflux disease)    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   Migraine    Palpitations 07/26/2019   Panic attack 06/26/2017   PONV (postoperative nausea and vomiting)    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 Use in each nostril as directed, Disp: 30 mL, Rfl: 12   Blood Glucose Monitoring Suppl DEVI, Check sugars up to 2 times daily. May substitute to any manufacturer covered by patient's insurance. E11.9, Disp: 1 each, Rfl: 0   busPIRone  (BUSPAR ) 7.5 MG tablet, Take 1 tablet (7.5 mg total) by mouth 3 (three) times daily. For anxiety, Disp: 270 tablet, Rfl: 0   citalopram  (CELEXA ) 40 MG tablet, Take 1 tablet (40 mg total) by mouth daily., Disp: 90 tablet, Rfl: 3   glucose blood (ACCU-CHEK GUIDE TEST) test strip, Test BS BID Dx E11.9, Disp: 200 each, Rfl: 3   guaiFENesin -codeine  100-10 MG/5ML syrup, Take 5 mLs by mouth 3 (three) times daily as needed for cough., Disp: 120 mL, Rfl: 0   hydrocortisone  (ANUSOL -HC) 2.5 % rectal cream, Place 1 Application rectally 2 (two) times daily as needed for hemorrhoids or anal itching. X5 days per flare up of hemorrhoid., Disp: 30 g, Rfl: 0   Lancet Device MISC, Check sugar up to 2 times daily. May substitute to any manufacturer covered by patient's insurance. E11.9, Disp: 1 each, Rfl: 0   Lancets Misc. MISC, Check sugar up to 2  times daily. May substitute to any manufacturer covered by patient's insurance.E11.9, Disp: 100 each, Rfl: 3   lidocaine  (ASPERCREME LIDOCAINE ) 4 %, Place 1 patch onto the skin daily as needed (pain)., Disp: , Rfl:    methocarbamol  (ROBAXIN ) 750 MG tablet, Take 1 tablet (750 mg total) by mouth every 8 (eight) hours as needed for muscle spasms., Disp: 90 tablet, Rfl: 0   metoprolol  tartrate (LOPRESSOR ) 50 MG tablet, TAKE ONE (1) TABLET BY MOUTH TWO (2) TIMES DAILY, Disp: 180 tablet, Rfl: 3   ondansetron  (ZOFRAN -ODT) 4 MG disintegrating tablet, Take 1 tablet (4 mg total)  by mouth every 8 (eight) hours as needed for nausea or vomiting., Disp: 20 tablet, Rfl: 0   pantoprazole  (PROTONIX ) 40 MG tablet, Take 1 tablet (40 mg total) by mouth 2 (two) times daily before a meal., Disp: 90 tablet, Rfl: 3   rosuvastatin  (CRESTOR ) 10 MG tablet, Take 1 tablet (10 mg total) by mouth daily. For cholesterol/ fatty liver, Disp: 90 tablet, Rfl: 3   SUMAtriptan  (IMITREX ) 50 MG tablet, Take 1 tablet (50 mg total) by mouth daily as needed for migraine. May repeat in 4 hours if headache persists or recurs., Disp: 6 tablet, Rfl: PRN   tirzepatide  (MOUNJARO ) 15 MG/0.5ML Pen, Inject 15 mg into the skin once a week., Disp: 6 mL, Rfl: 3   valACYclovir  (VALTREX ) 1000 MG tablet, Take 2 tablets (2,000 mg total) by mouth 2 (two) times daily. X1 day per flare up of fever blister., Disp: 20 tablet, Rfl: 0 Social History   Socioeconomic History   Marital status: Divorced    Spouse name: Not on file   Number of children: 1   Years of education: Not on file   Highest education level: Some college, no degree  Occupational History   Not on file  Tobacco Use   Smoking status: Former    Current packs/day: 0.00    Types: Cigarettes    Quit date: 06/02/2014    Years since quitting: 9.5   Smokeless tobacco: Never  Vaping Use   Vaping status: Never Used  Substance and Sexual Activity   Alcohol use: No   Drug use: Not Currently    Types: Marijuana    Comment: none since a teenager   Sexual activity: Not Currently    Birth control/protection: Surgical  Other Topics Concern   Not on file  Social History Narrative   Not on file   Social Drivers of Health   Financial Resource Strain: Not on file  Food Insecurity: No Food Insecurity (02/28/2022)   Hunger Vital Sign    Worried About Running Out of Food in the Last Year: Never true    Ran Out of Food in the Last Year: Never true  Transportation Needs: No Transportation Needs (02/28/2022)   PRAPARE - Administrator, Civil Service  (Medical): No    Lack of Transportation (Non-Medical): No  Physical Activity: Not on file  Stress: Not on file  Social Connections: Not on file  Intimate Partner Violence: Not on file   Family History  Problem Relation Age of Onset   Heart disease Mother    Diabetes Mother    Cancer Father        bladder   Autism Brother    Diabetes Brother    Heart disease Brother    Diabetes Brother    Diabetes Brother    Breast cancer Maternal Aunt        double mastectomy  Objective: Office vital signs reviewed. BP 117/76   Pulse 75   Temp (!) 97.3 F (36.3 C)   Ht 5' 7 (1.702 m)   Wt 265 lb (120.2 kg)   LMP 02/24/2013   SpO2 96%   BMI 41.50 kg/m   Physical Examination:  General: Awake, alert, well nourished, No acute distress HEENT: sclera white, MMM Cardio: regular rate and rhythm, S1S2 heard, no murmurs appreciated Pulm: clear to auscultation bilaterally, no wheezes, rhonchi or rales; normal work of breathing on room air  Assessment/ Plan: 48 y.o. female   Diabetes mellitus treated with injections of non-insulin medication (HCC) - Plan: Bayer DCA Hb A1c Waived  Hypertension associated with diabetes (HCC) - Plan: Basic Metabolic Panel  Hyperlipidemia associated with type 2 diabetes mellitus (HCC)  Blood sugars under excellent control with A1c at 5.2.  She will continue current regimen.  We did briefly discuss maybe switching her back to Ozempic  but I do not think that would increase her weight loss necessarily since the data is better with Mounjaro .  She will let me know if she decides that she does not want to try that again though.  Blood pressure is controlled.  Check renal function  Not yet due for fasting lipid.  Continue statin  Norene CHRISTELLA Fielding, DO [facility] [phone]

## 2024-01-04 ENCOUNTER — Ambulatory Visit: Payer: Self-pay | Admitting: Family Medicine

## 2024-01-19 ENCOUNTER — Telehealth: Payer: Self-pay | Admitting: Family Medicine

## 2024-01-19 NOTE — Telephone Encounter (Unsigned)
 Copied from CRM [id number]. Topic: Clinical - Medication Prior Auth >> Jan 19, 2024  4:44 PM DeAngela L wrote: Reason for CRM: patient went to her pharmacy today to pick up her refill and was told she needs a prior auth to get a refill so she was not able to get her prescription   tirzepatide  (MOUNJARO ) 15 MG/0.5ML Pen  [facility] [place] - [address] [place] [address] Phone: [phone] Fax: [phone]  Pt num [phone] BENNIE)

## 2024-01-20 ENCOUNTER — Telehealth: Payer: Self-pay

## 2024-01-20 ENCOUNTER — Other Ambulatory Visit (HOSPITAL_COMMUNITY): Payer: Self-pay

## 2024-01-20 NOTE — Telephone Encounter (Signed)
 Per test claim: The current 28 day co-pay is, $4.  No PA needed at this time. This test claim was processed through [facility] copay amounts may vary at other pharmacies due to pharmacy/plan contracts, or as the patient moves through the different stages of their insurance plan.      Called pharmacy to run prescription for 1 month supply instead or 3 month supply and they received a paid claim. Pharmacy is now filling prescription.

## 2024-01-20 NOTE — Telephone Encounter (Signed)
 Patient aware, verbalized understanding.

## 2024-01-20 NOTE — Telephone Encounter (Signed)
 Pharmacy Patient Advocate Encounter   Received notification from Pt Calls Messages that prior authorization for MOUNJARO  is required/requested.   Insurance verification completed.   The patient is insured through [facility] .   Per test claim: The current 28 day co-pay is, $4.  No PA needed at this time. This test claim was processed through [facility]- copay amounts may vary at other pharmacies due to pharmacy/plan contracts, or as the patient moves through the different stages of their insurance plan.     Called pharmacy to run prescription for 1 month supply instead or 3 month supply and they received a paid claim.

## 2024-02-10 ENCOUNTER — Telehealth: Payer: Self-pay | Admitting: Family Medicine

## 2024-02-10 ENCOUNTER — Encounter: Payer: Self-pay | Admitting: Family Medicine

## 2024-02-10 ENCOUNTER — Ambulatory Visit: Admitting: Family Medicine

## 2024-02-10 VITALS — BP 111/75 | HR 85 | Temp 98.1°F | Ht 67.0 in | Wt 267.8 lb

## 2024-02-10 DIAGNOSIS — J0100 Acute maxillary sinusitis, unspecified: Secondary | ICD-10-CM

## 2024-02-10 DIAGNOSIS — H6692 Otitis media, unspecified, left ear: Secondary | ICD-10-CM | POA: Diagnosis not present

## 2024-02-10 MED ORDER — AMOXICILLIN-POT CLAVULANATE 875-125 MG PO TABS: 1.0000 | ORAL_TABLET | Freq: Two times a day (BID) | ORAL | 0 refills | Status: AC

## 2024-02-10 MED ORDER — GUAIFENESIN-CODEINE 100-10 MG/5ML PO SOLN: 5.0000 mL | Freq: Four times a day (QID) | ORAL | 0 refills | Status: DC | PRN

## 2024-02-10 NOTE — Telephone Encounter (Signed)
 Apt scheduled.

## 2024-02-10 NOTE — Progress Notes (Signed)
 Acute Office Visit  Subjective:     Patient ID: Cheryl Randall, female    DOB: 15-Apr-1976, 48 y.o.   MRN: [id number]  Chief Complaint  Patient presents with   Facial Pain    HPI  History of Present Illness   Cheryl Randall is a 48 year old female who presents with sinus congestion and ear pain.  Sinonasal congestion and rhinorrhea - Sinus congestion for one week - Significant pressure around nose and cheeks - Thick, green mucus occasionally mixed with blood  Cough and throat obstruction - Persistent cough for one week - Unable to sleep for two days due to persistent coughing - Failed OTC cough medications.  - Cough syrup with codeine  has been benefical in past.   Otaligia and pruritus - Bilateral ear pain  - Discomfort and urge to scratch ears  Facial and dental pain - Teeth and face pain attributed to sinus issues  Respiratory symptoms - Mild shortness of breath with cough - No wheezing       ROS As per HPI.     Objective:    BP 111/75   Pulse 85   Temp 98.1 F (36.7 C) (Temporal)   Ht 5' 7 (1.702 m)   Wt 267 lb 12.8 oz (121.5 kg)   LMP 02/24/2013   SpO2 97%   BMI 41.94 kg/m    Physical Exam Vitals and nursing note reviewed.  Constitutional:      General: She is not in acute distress.    Appearance: She is not ill-appearing, toxic-appearing or diaphoretic.  HENT:     Head: Normocephalic and atraumatic.     Right Ear: Ear canal and external ear normal. A middle ear effusion is present. No mastoid tenderness. Tympanic membrane is not erythematous or bulging.     Left Ear: Ear canal and external ear normal. No mastoid tenderness. Tympanic membrane is erythematous. Tympanic membrane is not bulging.     Nose: Congestion present.     Right Sinus: Maxillary sinus tenderness present.     Left Sinus: Maxillary sinus tenderness present.     Mouth/Throat:     Mouth: Mucous membranes are moist.     Pharynx: Oropharynx is clear.     Tonsils: No  tonsillar exudate or tonsillar abscesses. 0 on the right. 0 on the left.  Eyes:     General:        Right eye: No discharge.        Left eye: No discharge.     Conjunctiva/sclera: Conjunctivae normal.  Neck:     Vascular: No carotid bruit.  Cardiovascular:     Rate and Rhythm: Normal rate and regular rhythm.     Heart sounds: Normal heart sounds. No murmur heard. Pulmonary:     Effort: Pulmonary effort is normal. No respiratory distress.     Breath sounds: Normal breath sounds. No wheezing, rhonchi or rales.  Musculoskeletal:     Cervical back: Neck supple. No rigidity.     Right lower leg: No edema.     Left lower leg: No edema.  Skin:    General: Skin is warm and dry.  Neurological:     General: No focal deficit present.     Mental Status: She is alert and oriented to person, place, and time.  Psychiatric:        Mood and Affect: Mood normal.        Behavior: Behavior normal.     No results found for  any visits on 02/10/24.      Assessment & Plan:   Cheryl Randall was seen today for facial pain.  Diagnoses and all orders for this visit:  Acute non-recurrent maxillary sinusitis -     amoxicillin -clavulanate (AUGMENTIN ) 875-125 MG tablet; Take 1 tablet by mouth 2 (two) times daily for 10 days. -     guaiFENesin -codeine  100-10 MG/5ML syrup; Take 5 mLs by mouth every 6 (six) hours as needed for cough.  Acute left otitis media -     amoxicillin -clavulanate (AUGMENTIN ) 875-125 MG tablet; Take 1 tablet by mouth 2 (two) times daily for 10 days.      Acute maxillary sinusitis and left acute otitis media Acute sinusitis and otitis media with sinus pressure - Prescribed Augmentin  twice daily for 10 days. - Sent prescription to pharmacy.  Cough Cough due to sinus drainage. Previous cough medicine not covered by insurance. Robitussin AC chosen as affordable alternative. PDMP reviewed, no red flags. - Prescribed Robitussin AC. - Sent prescription to pharmacy.     Return to office  for new or worsening symptoms, or if symptoms persist.   The patient indicates understanding of these issues and agrees with the plan.  Cheryl CHRISTELLA Search, FNP

## 2024-02-10 NOTE — Telephone Encounter (Signed)
 Patient refused triage, scheduled today, 9/10 at 1:50   Copied from CRM #[id number]. Topic: Clinical - Red Word Triage >> Feb 10, 2024  9:20 AM Donna BRAVO wrote: Red Word that prompted transfer to Nurse Triage: patient has possible sinus infection, mucus is green with blood in it, cough, head pressure >> Feb 10, 2024  9:30 AM Donna BRAVO wrote: Patient refused to speak with nurse triage and insisted on scheduling an appt

## 2024-03-23 ENCOUNTER — Other Ambulatory Visit: Payer: Self-pay

## 2024-03-23 ENCOUNTER — Emergency Department (HOSPITAL_COMMUNITY)
Admission: EM | Admit: 2024-03-23 | Discharge: 2024-03-24 | Disposition: A | Discharge status: Home / Self Care | Attending: Emergency Medicine | Admitting: Emergency Medicine

## 2024-03-23 ENCOUNTER — Encounter (HOSPITAL_COMMUNITY): Payer: Self-pay

## 2024-03-23 DIAGNOSIS — Z91040 Latex allergy status: Secondary | ICD-10-CM | POA: Diagnosis not present

## 2024-03-23 DIAGNOSIS — R110 Nausea: Secondary | ICD-10-CM | POA: Diagnosis not present

## 2024-03-23 DIAGNOSIS — I10 Essential (primary) hypertension: Secondary | ICD-10-CM | POA: Insufficient documentation

## 2024-03-23 DIAGNOSIS — R519 Headache, unspecified: Secondary | ICD-10-CM | POA: Diagnosis not present

## 2024-03-23 DIAGNOSIS — G43809 Other migraine, not intractable, without status migrainosus: Secondary | ICD-10-CM

## 2024-03-23 DIAGNOSIS — R42 Dizziness and giddiness: Secondary | ICD-10-CM

## 2024-03-23 DIAGNOSIS — Z79899 Other long term (current) drug therapy: Secondary | ICD-10-CM | POA: Diagnosis not present

## 2024-03-23 DIAGNOSIS — E119 Type 2 diabetes mellitus without complications: Secondary | ICD-10-CM | POA: Diagnosis not present

## 2024-03-23 DIAGNOSIS — G43909 Migraine, unspecified, not intractable, without status migrainosus: Secondary | ICD-10-CM | POA: Diagnosis not present

## 2024-03-23 NOTE — ED Notes (Signed)
 ED Provider at bedside.

## 2024-03-23 NOTE — ED Triage Notes (Addendum)
 Pt bib EMS from home with c/o dizziness that started around 6 pm, pt says every time she opens her eyes, the room starts spinning and she gets nauseated, has vomited twice. Pt also reports her ears have been bothering her recently

## 2024-03-24 ENCOUNTER — Emergency Department (HOSPITAL_COMMUNITY)

## 2024-03-24 DIAGNOSIS — R42 Dizziness and giddiness: Secondary | ICD-10-CM | POA: Diagnosis not present

## 2024-03-24 DIAGNOSIS — R519 Headache, unspecified: Secondary | ICD-10-CM | POA: Diagnosis not present

## 2024-03-24 DIAGNOSIS — R29818 Other symptoms and signs involving the nervous system: Secondary | ICD-10-CM | POA: Diagnosis not present

## 2024-03-24 DIAGNOSIS — R1110 Vomiting, unspecified: Secondary | ICD-10-CM | POA: Diagnosis not present

## 2024-03-24 LAB — I-STAT CHEM 8, ED
BUN: 21 mg/dL — ABNORMAL HIGH (ref 6–20)
Calcium, Ion: 1.19 mmol/L (ref 1.15–1.40)
Chloride: 103 mmol/L (ref 98–111)
Creatinine, Ser: 0.8 mg/dL (ref 0.44–1.00)
Glucose, Bld: 118 mg/dL — ABNORMAL HIGH (ref 70–99)
HCT: 39 % (ref 36.0–46.0)
Hemoglobin: 13.3 g/dL (ref 12.0–15.0)
Potassium: 4 mmol/L (ref 3.5–5.1)
Sodium: 141 mmol/L (ref 135–145)
TCO2: 27 mmol/L (ref 22–32)

## 2024-03-24 LAB — DIFFERENTIAL
Abs Immature Granulocytes: 0.03 K/uL (ref 0.00–0.07)
Basophils Absolute: 0.1 K/uL (ref 0.0–0.1)
Basophils Relative: 1 %
Eosinophils Absolute: 0 K/uL (ref 0.0–0.5)
Eosinophils Relative: 0 %
Immature Granulocytes: 0 %
Lymphocytes Relative: 12 %
Lymphs Abs: 0.9 K/uL (ref 0.7–4.0)
Monocytes Absolute: 0.4 K/uL (ref 0.1–1.0)
Monocytes Relative: 5 %
Neutro Abs: 6.2 K/uL (ref 1.7–7.7)
Neutrophils Relative %: 82 %

## 2024-03-24 LAB — CBC
HCT: 39.9 % (ref 36.0–46.0)
Hemoglobin: 12.6 g/dL (ref 12.0–15.0)
MCH: 27.6 pg (ref 26.0–34.0)
MCHC: 31.6 g/dL (ref 30.0–36.0)
MCV: 87.3 fL (ref 80.0–100.0)
Platelets: 201 K/uL (ref 150–400)
RBC: 4.57 MIL/uL (ref 3.87–5.11)
RDW: 13.6 % (ref 11.5–15.5)
WBC: 7.6 K/uL (ref 4.0–10.5)
nRBC: 0 % (ref 0.0–0.2)

## 2024-03-24 LAB — URINE DRUG SCREEN
Amphetamines: NEGATIVE
Barbiturates: NEGATIVE
Benzodiazepines: NEGATIVE
Cocaine: NEGATIVE
Fentanyl: NEGATIVE
Methadone Scn, Ur: NEGATIVE
Opiates: NEGATIVE
Tetrahydrocannabinol: NEGATIVE

## 2024-03-24 LAB — COMPREHENSIVE METABOLIC PANEL WITH GFR
ALT: 11 U/L (ref 0–44)
AST: 19 U/L (ref 15–41)
Albumin: 4.2 g/dL (ref 3.5–5.0)
Alkaline Phosphatase: 64 U/L (ref 38–126)
Anion gap: 9 (ref 5–15)
BUN: 19 mg/dL (ref 6–20)
CO2: 26 mmol/L (ref 22–32)
Calcium: 9 mg/dL (ref 8.9–10.3)
Chloride: 103 mmol/L (ref 98–111)
Creatinine, Ser: 0.64 mg/dL (ref 0.44–1.00)
GFR, Estimated: >60 mL/min (ref >60)
Glucose, Bld: 117 mg/dL — ABNORMAL HIGH (ref 70–99)
Potassium: 4.1 mmol/L (ref 3.5–5.1)
Sodium: 138 mmol/L (ref 135–145)
Total Bilirubin: 0.5 mg/dL (ref 0.0–1.2)
Total Protein: 7.2 g/dL (ref 6.5–8.1)

## 2024-03-24 LAB — URINALYSIS, ROUTINE W REFLEX MICROSCOPIC
Bilirubin Urine: NEGATIVE
Glucose, UA: NEGATIVE mg/dL
Hgb urine dipstick: NEGATIVE
Ketones, ur: 5 mg/dL — AB
Leukocytes,Ua: NEGATIVE
Nitrite: NEGATIVE
Protein, ur: NEGATIVE mg/dL
Specific Gravity, Urine: 1.01 (ref 1.005–1.030)
pH: 6 (ref 5.0–8.0)

## 2024-03-24 LAB — TROPONIN T, HIGH SENSITIVITY
Troponin T High Sensitivity: <15 ng/L (ref 0–19)
Troponin T High Sensitivity: <15 ng/L (ref 0–19)

## 2024-03-24 LAB — PROTIME-INR
INR: 1 (ref 0.8–1.2)
Prothrombin Time: 13.4 s (ref 11.4–15.2)

## 2024-03-24 LAB — ETHANOL: Alcohol, Ethyl (B): <15 mg/dL (ref <15)

## 2024-03-24 LAB — APTT: aPTT: 25 s (ref 24–36)

## 2024-03-24 MED ORDER — DIPHENHYDRAMINE HCL 50 MG/ML IJ SOLN
25.0000 mg | Freq: Once | INTRAMUSCULAR | Status: AC
  Administered 2024-03-24: 25 mg via INTRAVENOUS
  Filled 2024-03-24: qty 1

## 2024-03-24 MED ORDER — ONDANSETRON HCL 4 MG/2ML IJ SOLN
4.0000 mg | Freq: Once | INTRAMUSCULAR | Status: AC
Start: 2024-03-24 — End: 2024-03-24
  Administered 2024-03-24: 4 mg via INTRAVENOUS
  Filled 2024-03-24: qty 2

## 2024-03-24 MED ORDER — PROCHLORPERAZINE EDISYLATE 10 MG/2ML IJ SOLN
10.0000 mg | Freq: Once | INTRAMUSCULAR | Status: AC
  Administered 2024-03-24: 10 mg via INTRAVENOUS
  Filled 2024-03-24: qty 2

## 2024-03-24 MED ORDER — LACTATED RINGERS IV BOLUS
1000.0000 mL | Freq: Once | INTRAVENOUS | Status: AC
  Administered 2024-03-24: 1000 mL via INTRAVENOUS

## 2024-03-24 MED ORDER — IOHEXOL 350 MG/ML SOLN
100.0000 mL | Freq: Once | INTRAVENOUS | Status: AC | PRN
  Administered 2024-03-24: 100 mL via INTRAVENOUS

## 2024-03-24 MED ORDER — MECLIZINE HCL 25 MG PO TABS: 25.0000 mg | ORAL_TABLET | Freq: Three times a day (TID) | ORAL | 0 refills | Status: AC | PRN

## 2024-03-24 MED ORDER — DEXAMETHASONE SOD PHOSPHATE PF 10 MG/ML IJ SOLN
10.0000 mg | Freq: Once | INTRAMUSCULAR | Status: AC
  Administered 2024-03-24: 10 mg via INTRAVENOUS

## 2024-03-24 NOTE — Consult Note (Signed)
 [facility]   Patient Name:   Cheryl, Randall Date of Birth:   [birth year] Identification Number:   MRN - [id number] Date of Service:   03/24/2024 00:06:35  Diagnosis:       R51.9 - Headache, unspecified  Impression:      Complex migraine versus CVA:      - 48 year old woman with history of diabetes, hypertension, hyperlipidemia, complex migraines, mood disorder, chronic pain syndrome, degenerative disease, and panic attacks presents with acute onset vertiginous dizziness, nausea, severe headache rated 7/10 with photophobia, and right-sided weakness. Last known well at 1900 hours. Neurological examination reveals bilateral nystagmus and bilateral disconjugate gaze. Given her history of complex migraines, this presentation could represent a complex migraine with neurological symptoms. However, the acute onset of right-sided weakness combined with bilateral eye movement abnormalities raises concern for cerebrovascular accident, particularly given her multiple vascular risk factors.    - given at the same time: 2,000 mg IV magnesium sulfate, 1000 mg IV acetaminophen  (if available) or 1000 mg PO acetaminophen  or 30mg  of Toradol , 25 mg IV diphenhydramine , and 10 mg IV prochlorperazine.   - Provide a 500cc normal saline bolus followed by 60 cc/hr for 8 hours.   - If the headache does not resolve after the above, then provide 500 mg IV divalproex sodium once.   - If the headache does not resolve after the above, then provide 100mg  IV lacosamide once.   - If the headache does not resolve after the above after a 2 hours, then provide 10 mg IV dexamethasone  once.   - If dexamethasone  was needed, patient can be discharged with a methylprednisolone  dose-pack as a cycle spacer from the hospital.   - If the headache can be controlled and symptoms tolerable after any of the above, would be stable from a neurologic perspective for discharge with outpatient  neurology follow-up.   - If his headache can not be controlled to a tolerable range with the above, admit for MRI brain with and without contrast.    Our recommendations are outlined below.  Recommendations:        Euglycemia and Avoid Hyperthermia (PRN Acetaminophen )  Sign Out:       Discussed with Emergency Department Provider    ------------------------------------------------------------------------------  Advanced Imaging: CTA Head and Neck Completed.  CTP Completed.  LVO:No  Patient is not a candidate for NIR   Metrics: Last Known Well: 03/23/2024 19:00:00 Dispatch Time: 03/24/2024 00:06:34 Arrival Time: 03/23/2024 23:25:00 Initial Response Time: 03/24/2024 00:09:48 Symptoms: dizziness, headache and right sided weakness. Initial patient interaction: 03/24/2024 00:11:58 NIHSS Assessment Completed: 03/24/2024 00:16:43 Patient is not a candidate for Thrombolytic. Thrombolytic Medical Decision: 03/24/2024 00:29:30 Patient was not deemed candidate for Thrombolytic because of following reasons: LKW outside 4.5 hr window. .  CT Head: I personally reviewed all the CT images that were available to me and it showed: no acute findings   Primary Provider Notified of Diagnostic Impression and Management Plan on: 03/24/2024 00:42:59    ------------------------------------------------------------------------------  History of Present Illness: Patient is a 48 year old Female.  Patient was brought by EMS for symptoms of dizziness, headache and right sided weakness. The patient is a 48 year old woman with a medical history of mood disorder, chronic pain syndrome, degenerative disease, diabetes mellitus, hypertension, hyperlipidemia, complex migraines, and panic attacks who presents to the ED with complaints of sudden onset of vertiginous dizziness, nausea, headache, and right-sided weakness.  The patient reports that her headache is rated as 7 out of  10 on the pain scale  and is associated with light sensitivity and nausea. Her last known well time was 1900 hours, indicating the acute nature of her symptom onset.  Upon review of systems, the patient's neurological symptoms include vertiginous dizziness and right-sided weakness. The patient reports headache with a severity of 7 out of 10 and photophobia. Gastrointestinal review is positive for nausea.  As of note, significant right-sided weakness and vertiginous dizziness were appreciated during the examination, with symptoms persisting since onset.   Past Medical History:      Hypertension      Diabetes Mellitus  Medications:  No Anticoagulant use  No Antiplatelet use Reviewed EMR for current medications  Allergies:  Reviewed  Social History: Drug Use: No  Family History:  There is no family history of premature cerebrovascular disease pertinent to this consultation  ROS : 14 Points Review of Systems was performed and was negative except mentioned in HPI.  Past Surgical History: There Is No Surgical History Contributory To Today's Visit    Examination: BP(139/85), Pulse(74), 1A: Level of Consciousness - Alert; keenly responsive + 0 1B: Ask Month and Age - Both Questions Right + 0 1C: Blink Eyes & Squeeze Hands - Performs Both Tasks + 0 2: Test Horizontal Extraocular Movements - Normal + 0 3: Test Visual Fields - No Visual Loss + 0 4: Test Facial Palsy (Use Grimace if Obtunded) - Normal symmetry + 0 5A: Test Left Arm Motor Drift - No Drift for 10 Seconds + 0 5B: Test Right Arm Motor Drift - No Drift for 10 Seconds + 0 6A: Test Left Leg Motor Drift - No Drift for 5 Seconds + 0 6B: Test Right Leg Motor Drift - Drift, but doesn't hit bed + 1 7: Test Limb Ataxia (FNF/Heel-Shin) - No Ataxia + 0 8: Test Sensation - Mild-Moderate Loss: Less Sharp/More Dull + 1 9: Test Language/Aphasia - Normal; No aphasia + 0 10: Test Dysarthria - Normal + 0 11: Test Extinction/Inattention - No abnormality +  0  NIHSS Score: 2   Pre-Morbid Modified Rankin Scale: 0 Points = No symptoms at all  Spoke with : Dr, lorette  This consult was conducted in real time using interactive audio and Immunologist. Patient was informed of the technology being used for this visit and agreed to proceed. Patient located in hospital and provider located at home/office setting.   Patient is being evaluated for possible acute neurologic impairment and high probability of imminent or life-threatening deterioration. I spent total of 45 minutes providing care to this patient, including time for face to face visit via telemedicine, review of medical records, imaging studies and discussion of findings with providers, the patient and/or family.    Dr Reford Olliff   [facility] For Inpatient follow-up with [facility] physician please call [facility] at [phone]. As we are not an outpatient service for any post hospital discharge needs please contact the hospital for assistance. If you have any questions for the [facility] physicians or need to reconsult for clinical or diagnostic changes please contact us  via [facility] at [phone].  Non-radiologist review of imaging performed to assist with emergent clinical decision-making. Remote physician workstations do not possess the same resolution, calibration, or diagnostic capabilities as hospital-based radiology reading stations, and formal radiologist read is necessary.   Signature : Han Lysne

## 2024-03-24 NOTE — ED Notes (Addendum)
 Pt says she feels better, able to open eyes, can track finger with eyes, no nystagmus or disconjugate gaze noted at this time. Nausea and headache almost resolved. Dr Lorette aware.

## 2024-03-24 NOTE — ED Notes (Signed)
 CT head complete- now pt is having profusion CT in another CT room

## 2024-03-24 NOTE — ED Provider Notes (Signed)
 [place] EMERGENCY DEPARTMENT AT [facility] Provider Note   CSN: [id number] Arrival date & time: 03/23/24  2325  An emergency department physician performed an initial assessment on this suspected stroke patient at 0010.  Patient presents with: Dizziness   Cheryl Randall is a 48 y.o. female.   presents with vertigo and double vision that began suddenly during a bible study around 7:00 PM. She describes the sensation as seeing two or three of everything, with persistent nystagmus and an inability to stabilize her gaze. She reports associated vomiting and recent issues with her left ear, though she denies any recent falls or trauma. She has no prior history of similar symptoms. Her medical history is significant for hypertension and diabetes, for which she takes metoprolol  50 mg and Mounjaro , respectively. She also takes Celexa . The patient denies any recent changes in her medication regimen or any new exposures. History was obtained from the patient and her sister.   Dizziness      Prior to Admission medications   Medication Sig Start Date End Date Taking? Authorizing Provider  meclizine (ANTIVERT) 25 MG tablet Take 1 tablet (25 mg total) by mouth 3 (three) times daily as needed for dizziness. 03/24/24  Yes Merle Whitehorn, Selinda, MD  albuterol  (VENTOLIN  HFA) 108 (90 Base) MCG/ACT inhaler Inhale 1-2 puffs into the lungs every 6 (six) hours as needed for wheezing or shortness of breath. 07/07/22   Lavell Lye A, FNP  azelastine  (ASTELIN ) 0.1 % nasal spray Place 1 spray into both nostrils 2 (two) times daily. Use in each nostril as directed 05/11/23   Deitra Morton Sebastian Nena, NP  Blood Glucose Monitoring Suppl DEVI Check sugars up to 2 times daily. May substitute to any manufacturer covered by patient's insurance. E11.9 08/04/22   [name], DO  busPIRone  (BUSPAR ) 7.5 MG tablet Take 1 tablet (7.5 mg total) by mouth 3 (three) times daily. For anxiety 11/17/23   Jolinda Norene  M, DO  citalopram  (CELEXA ) 40 MG tablet Take 1 tablet (40 mg total) by mouth daily. 06/15/23   [name], DO  glucose blood (ACCU-CHEK GUIDE TEST) test strip Test BS BID Dx E11.9 08/12/23   Jolinda Norene M, DO  guaiFENesin -codeine  100-10 MG/5ML syrup Take 5 mLs by mouth every 6 (six) hours as needed for cough. 02/10/24   Joesph Annabella HERO, FNP  hydrocortisone  (ANUSOL -HC) 2.5 % rectal cream Place 1 Application rectally 2 (two) times daily as needed for hemorrhoids or anal itching. X5 days per flare up of hemorrhoid. 11/17/23   [name], DO  Lancet Device MISC Check sugar up to 2 times daily. May substitute to any manufacturer covered by patient's insurance. E11.9 08/04/22   [name], DO  Lancets Misc. MISC Check sugar up to 2 times daily. May substitute to any manufacturer covered by patient's insurance.E11.9 08/04/22   Jolinda Norene M, DO  lidocaine  (ASPERCREME LIDOCAINE ) 4 % Place 1 patch onto the skin daily as needed (pain).    [provider]  methocarbamol  (ROBAXIN ) 750 MG tablet Take 1 tablet (750 mg total) by mouth every 8 (eight) hours as needed for muscle spasms. 09/14/23   [name], DO  metoprolol  tartrate (LOPRESSOR ) 50 MG tablet TAKE ONE (1) TABLET BY MOUTH TWO (2) TIMES DAILY 06/15/23   Jolinda Norene M, DO  ondansetron  (ZOFRAN -ODT) 4 MG disintegrating tablet Take 1 tablet (4 mg total) by mouth every 8 (eight) hours as needed for nausea or vomiting. 10/30/22  Hawks, Christy A, FNP  pantoprazole  (PROTONIX ) 40 MG tablet Take 1 tablet (40 mg total) by mouth 2 (two) times daily before a meal. 09/14/23   Jolinda Potter M, DO  rosuvastatin  (CRESTOR ) 10 MG tablet Take 1 tablet (10 mg total) by mouth daily. For cholesterol/ fatty liver 06/15/23   Jolinda Potter M, DO  SUMAtriptan  (IMITREX ) 50 MG tablet Take 1 tablet (50 mg total) by mouth daily as needed for migraine. May repeat in 4 hours if headache persists or recurs. 09/14/23   Jolinda Potter HERO, DO  tirzepatide  (MOUNJARO ) 15 MG/0.5ML Pen Inject 15 mg into the skin once a week. 06/15/23   Jolinda Potter HERO, DO  valACYclovir  (VALTREX ) 1000 MG tablet Take 2 tablets (2,000 mg total) by mouth 2 (two) times daily. X1 day per flare up of fever blister. 06/15/23   Jolinda Potter HERO, DO    Allergies: Codeine , Latex, Norco [hydrocodone -acetaminophen ], and Tape    Review of Systems  Neurological:  Positive for dizziness.    Updated Vital Signs BP 105/70   Pulse 67   Temp 97.6 F (36.4 C) (Oral)   Resp 17   Ht 5' 7 (1.702 m)   Wt 113.4 kg   LMP 02/24/2013   SpO2 91%   BMI 39.16 kg/m   Physical Exam Vitals and nursing note reviewed.  Constitutional:      Appearance: She is well-developed.  HENT:     Head: Normocephalic and atraumatic.  Cardiovascular:     Rate and Rhythm: Normal rate and regular rhythm.  Pulmonary:     Effort: No respiratory distress.     Breath sounds: No stridor.  Abdominal:     General: There is no distension.  Musculoskeletal:     Cervical back: Normal range of motion.  Neurological:     Mental Status: She is alert.     Comments: Mild diminished strength on the right  Bilateral horizontal nystagmus at rest  Bilateral dysconjugate gaze when looking laterally     (all labs ordered are listed, but only abnormal results are displayed) Labs Reviewed  COMPREHENSIVE METABOLIC PANEL WITH GFR - Abnormal; Notable for the following components:      Result Value   Glucose, Bld 117 (*)    All other components within normal limits  URINALYSIS, ROUTINE W REFLEX MICROSCOPIC - Abnormal; Notable for the following components:   Ketones, ur 5 (*)    All other components within normal limits  I-STAT CHEM 8, ED - Abnormal; Notable for the following components:   BUN 21 (*)    Glucose, Bld 118 (*)    All other components within normal limits  PROTIME-INR  APTT  CBC  DIFFERENTIAL  ETHANOL  URINE DRUG SCREEN  TROPONIN T, HIGH SENSITIVITY   TROPONIN T, HIGH SENSITIVITY    EKG: None  Radiology: DG Chest Portable 1 View Result Date: 03/24/2024 CLINICAL DATA:  Headaches and dizziness, initial encounter EXAM: PORTABLE CHEST 1 VIEW COMPARISON:  08/15/2021 FINDINGS: Cardiac shadow is stable. Lungs are well aerated bilaterally. No focal infiltrate or effusion is seen. No bony abnormality is noted. IMPRESSION: No active disease. Electronically Signed   By: Oneil Devonshire M.D.   On: 03/24/2024 00:55   CT ANGIO HEAD NECK W WO CM W PERF (CODE STROKE) Result Date: 03/24/2024 EXAM: CTA Head and Neck with Perfusion 03/24/2024 12:45:37 AM TECHNIQUE: CTA of the head and neck was performed without and with the administration of 100 mL of iohexol  (OMNIPAQUE ) 350 MG/ML injection. 3D  postprocessing with multiplanar reconstructions and MIPs was performed to evaluate the vascular anatomy. Cerebral perfusion analysis using computed tomography with contrast administration, including post-processing of parametric maps with determination of cerebral blood flow, cerebral blood volume, mean transit time and time-to-maximum. Automated exposure control, iterative reconstruction, and/or weight based adjustment of the mA/kV was utilized to reduce the radiation dose to as low as reasonably achievable. COMPARISON: None available CLINICAL HISTORY: Neuro deficit, acute, stroke suspected. c/o dizziness that started around 6 pm, pt says every time she opens her eyes, the room starts spinning and she gets nauseated, has vomited twice. Pt also reports her ears have been bothering her recently FINDINGS: CTA NECK: AORTIC ARCH AND ARCH VESSELS: No dissection or arterial injury. No significant stenosis of the brachiocephalic or subclavian arteries. CERVICAL CAROTID ARTERIES: No dissection, arterial injury, or hemodynamically significant stenosis by NASCET criteria. CERVICAL VERTEBRAL ARTERIES: No dissection, arterial injury, or significant stenosis. LUNGS AND MEDIASTINUM:  Unremarkable. SOFT TISSUES: No acute abnormality. BONES: No acute abnormality. CTA HEAD: ANTERIOR CIRCULATION: No significant stenosis of the internal carotid arteries. No significant stenosis of the anterior cerebral arteries. No significant stenosis of the middle cerebral arteries. No aneurysm. POSTERIOR CIRCULATION: No significant stenosis of the posterior cerebral arteries. No significant stenosis of the basilar artery. No significant stenosis of the vertebral arteries. No aneurysm. OTHER: No dural venous sinus thrombosis on this non-dedicated study. CT PERFUSION: EXAM QUALITY: Exam quality is adequate with diagnostic perfusion maps. No significant motion artifact. Appropriate arterial inflow and venous outflow curves. CORE INFARCT (CBF<30% volume): 0 mL TOTAL HYPOPERFUSION (Tmax>6s volume): 0 mL PENUMBRA: Mismatch volume: 0 mL Mismatch ratio: not applicable Location: not applicable IMPRESSION: 1. No acute large vessel occlusion. 2. No hemodynamically significant stenosis or aneurysm in the head or neck vessels. 3. No ischemia identified on CT brain perfusion. Electronically signed by: Franky Stanford MD 03/24/2024 12:53 AM EDT RP Workstation: [id number]   CT HEAD CODE STROKE WO CONTRAST (LKW 0-4.5h, LVO 0-24h) Result Date: 03/24/2024 EXAM: CT HEAD WITHOUT CONTRAST 03/24/2024 12:25:33 AM TECHNIQUE: CT of the head was performed without the administration of intravenous contrast. Automated exposure control, iterative reconstruction, and/or weight based adjustment of the mA/kV was utilized to reduce the radiation dose to as low as reasonably achievable. COMPARISON: None available. CLINICAL HISTORY: Neuro deficit, acute, stroke suspected. CODE STROKE. c/o dizziness that started around 6 pm, pt says every time she opens her eyes, the room starts spinning and she gets nauseated, has vomited twice. Pt also reports her ears have been bothering her recently. FINDINGS: BRAIN AND VENTRICLES: ASPECTS score is 10. No  acute hemorrhage. No evidence of acute infarct. No hydrocephalus. No extra-axial collection. No mass effect or midline shift. ORBITS: No acute abnormality. SINUSES: No acute abnormality. SOFT TISSUES AND SKULL: No acute soft tissue abnormality. No skull fracture. IMPRESSION: 1. No acute intracranial abnormality. 2. ASPECTS score is 10. Electronically signed by: Franky Stanford MD 03/24/2024 12:32 AM EDT RP Workstation: [id number]     Procedures   Medications Ordered in the ED  ondansetron  (ZOFRAN ) injection 4 mg (4 mg Intravenous Given 03/24/24 0034)  iohexol  (OMNIPAQUE ) 350 MG/ML injection 100 mL (100 mLs Intravenous Contrast Given 03/24/24 0032)  prochlorperazine (COMPAZINE) injection 10 mg (10 mg Intravenous Given 03/24/24 0105)  diphenhydrAMINE  (BENADRYL ) injection 25 mg (25 mg Intravenous Given 03/24/24 0059)  dexamethasone  (DECADRON ) injection 10 mg (10 mg Intravenous Given 03/24/24 0057)  lactated ringers  bolus 1,000 mL (0 mLs Intravenous Stopped 03/24/24 0222)  Medical Decision Making Amount and/or Complexity of Data Reviewed Labs: ordered. Radiology: ordered.  Risk Prescription drug management.   Initial presentation for vertigo but with atypical symptoms and acute onset without real positional changes and worry for LVO in the window I activated a code stroke. Imaging negative. Neuro consult thought more likely complex migraine. Labs reassuring.   Patietn with complete resolution of symptoms. Tolerating PO. Ambulating without difficulty. No nystagmus. No diplopia. No headache. At neurologic baseilne. Per neurology previous recommendations will discharge as she appears to be at neuro baseline. No indication for MRI, hospitalization or further workup at this time.    Final diagnoses:  Vertigo  Migraine variant    ED Discharge Orders          Ordered    meclizine (ANTIVERT) 25 MG tablet  3 times daily PRN        03/24/24 0306                Solly Derasmo, Selinda, MD 03/24/24 2324

## 2024-03-29 ENCOUNTER — Ambulatory Visit: Admitting: Family Medicine

## 2024-03-29 ENCOUNTER — Encounter: Payer: Self-pay | Admitting: Family Medicine

## 2024-03-29 VITALS — BP 122/84 | HR 80 | Temp 97.6°F | Ht 67.0 in | Wt 267.1 lb

## 2024-03-29 DIAGNOSIS — R2990 Unspecified symptoms and signs involving the nervous system: Secondary | ICD-10-CM

## 2024-03-29 DIAGNOSIS — R4189 Other symptoms and signs involving cognitive functions and awareness: Secondary | ICD-10-CM

## 2024-03-29 DIAGNOSIS — F411 Generalized anxiety disorder: Secondary | ICD-10-CM

## 2024-03-29 DIAGNOSIS — R200 Anesthesia of skin: Secondary | ICD-10-CM | POA: Diagnosis not present

## 2024-03-29 DIAGNOSIS — Z23 Encounter for immunization: Secondary | ICD-10-CM

## 2024-03-29 MED ORDER — BUSPIRONE HCL 7.5 MG PO TABS: 7.5000 mg | ORAL_TABLET | Freq: Three times a day (TID) | ORAL | 0 refills | Status: DC

## 2024-03-29 NOTE — Progress Notes (Signed)
 Subjective: CC: ER follow-up PCP: Jolinda Cheryl HERO, DO YEP:Cheryl Randall is a 48 y.o. female presenting to clinic today for:  Patient was seen in the ER on 03/23/2024 for what seemed like BPPV but there was concern for possible acute stroke so she had stat CT and CTA performed which demonstrated no acute abnormalities.  Code stroke was canceled.  She reports that the waves of vertigo occurred with nystagmus and later numbness along the right side of the face, upper extremity and lower extremity.  She still has brain fog and decree sensation along the right face.  She just does not feel herself and is not quite sure why.  Her workup was negative and she was told that she had a complex migraine.  However, she was not having any headache at the time.  She has had a dull headache ever since.  She would like to get a referral to neurology for further investigation.  Utilizing Imitrex  50 mg as needed headaches but even that is not working like it used to.  She did lyse meclizine but that did nothing.  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   Deep incisional surgical site infection 07/01/2015   Diabetes mellitus without complication (HCC)    borderline; diet controlled   Dysrhythmia    Essential hypertension 07/26/2019   Family history of adverse reaction to anesthesia    Patients son has bad N/V   GAD (generalized anxiety disorder) 06/26/2017   Generalized edema 03/24/2017   GERD (gastroesophageal reflux disease)    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 Use in each nostril as directed, Disp: 30 mL, Rfl: 12   Blood Glucose Monitoring Suppl DEVI, Check sugars up to 2 times daily. May substitute to any manufacturer covered by patient's insurance. E11.9, Disp: 1 each, Rfl: 0   busPIRone  (BUSPAR ) 7.5 MG tablet, Take 1 tablet (7.5 mg total) by mouth 3 (three) times daily. For anxiety, Disp: 270 tablet, Rfl: 0   citalopram  (CELEXA ) 40 MG tablet, Take 1 tablet (40 mg total) by mouth daily., Disp: 90 tablet, Rfl: 3   glucose blood (ACCU-CHEK GUIDE TEST) test strip, Test BS BID Dx E11.9, Disp: 200 each, Rfl: 3   guaiFENesin -codeine  100-10 MG/5ML syrup, Take 5 mLs by mouth every 6 (six) hours as needed for cough., Disp: 120 mL, Rfl: 0   hydrocortisone  (ANUSOL -HC) 2.5 % rectal cream, Place 1 Application rectally 2 (two) times daily as  needed for hemorrhoids or anal itching. X5 days per flare up of hemorrhoid., Disp: 30 g, Rfl: 0   Lancet Device MISC, Check sugar up to 2 times daily. May substitute to any manufacturer covered by patient's insurance. E11.9, Disp: 1 each, Rfl: 0   Lancets Misc. MISC, Check sugar up to 2 times daily. May substitute to any manufacturer covered by patient's insurance.E11.9, Disp: 100 each, Rfl: 3   lidocaine  (ASPERCREME LIDOCAINE ) 4 %, Place 1 patch onto the skin daily as needed (pain)., Disp: , Rfl:    meclizine (ANTIVERT) 25 MG tablet, Take 1 tablet (25 mg total) by  mouth 3 (three) times daily as needed for dizziness., Disp: 30 tablet, Rfl: 0   methocarbamol  (ROBAXIN ) 750 MG tablet, Take 1 tablet (750 mg total) by mouth every 8 (eight) hours as needed for muscle spasms., Disp: 90 tablet, Rfl: 0   metoprolol  tartrate (LOPRESSOR ) 50 MG tablet, TAKE ONE (1) TABLET BY MOUTH TWO (2) TIMES DAILY, Disp: 180 tablet, Rfl: 3   ondansetron  (ZOFRAN -ODT) 4 MG disintegrating tablet, Take 1 tablet (4 mg total) by mouth every 8 (eight) hours as needed for nausea or vomiting., Disp: 20 tablet, Rfl: 0   pantoprazole  (PROTONIX ) 40 MG tablet, Take 1 tablet (40 mg total) by mouth 2 (two) times daily before a meal., Disp: 90 tablet, Rfl: 3   rosuvastatin  (CRESTOR ) 10 MG tablet, Take 1 tablet (10 mg total) by mouth daily. For cholesterol/ fatty liver, Disp: 90 tablet, Rfl: 3   SUMAtriptan  (IMITREX ) 50 MG tablet, Take 1 tablet (50 mg total) by mouth daily as needed for migraine. May repeat in 4 hours if headache persists or recurs., Disp: 6 tablet, Rfl: PRN   tirzepatide  (MOUNJARO ) 15 MG/0.5ML Pen, Inject 15 mg into the skin once a week., Disp: 6 mL, Rfl: 3   valACYclovir  (VALTREX ) 1000 MG tablet, Take 2 tablets (2,000 mg total) by mouth 2 (two) times daily. X1 day per flare up of fever blister., Disp: 20 tablet, Rfl: 0 Social History   Socioeconomic History   Marital status: Divorced    Spouse name: Not on file   Number of children: 1   Years of education: Not on file   Highest education level: Some college, no degree  Occupational History   Not on file  Tobacco Use   Smoking status: Former    Current packs/day: 0.00    Types: Cigarettes    Quit date: 06/02/2014    Years since quitting: 9.8   Smokeless tobacco: Never  Vaping Use   Vaping status: Never Used  Substance and Sexual Activity   Alcohol use: No   Drug use: Not Currently    Types: Marijuana    Comment: none since a teenager   Sexual activity: Not Currently    Birth control/protection: Surgical  Other Topics  Concern   Not on file  Social History Narrative   Not on file   Social Drivers of Health   Financial Resource Strain: Not on file  Food Insecurity: No Food Insecurity (02/28/2022)   Hunger Vital Sign    Worried About Running Out of Food in the Last Year: Never true    Ran Out of Food in the Last Year: Never true  Transportation Needs: No Transportation Needs (02/28/2022)   PRAPARE - Administrator, Civil Service (Medical): No    Lack of Transportation (Non-Medical): No  Physical Activity: Not on file  Stress: Not on file  Social Connections: Not on file  Intimate Partner Violence: Not on file   Family History  Problem Relation Age of Onset   Heart disease Mother    Diabetes Mother    Cancer Father        bladder   Autism Brother    Diabetes Brother    Heart disease Brother    Diabetes Brother    Diabetes Brother    Breast cancer Maternal Aunt        double mastectomy    Objective: Office vital signs reviewed. BP 122/84   Pulse 80   Temp 97.6 F (36.4 C)   Ht 5' 7 (1.702 m)   Wt 267 lb 2 oz (121.2 kg)   LMP 02/24/2013   SpO2 96%   BMI 41.84 kg/m   Physical Examination:  General: Awake, alert, well nourished, appears stressed HEENT: Sclera white.  PERRLA.  EOMI. MSK: Normal gait and station Neuro: Decreased light touch sensation along the entire trigeminal nerve on the right.  Otherwise cranial nerves II through XII grossly intact.  No nystagmus appreciated.  Assessment/ Plan: 48 y.o. female   Stroke-like symptoms - Plan: Ambulatory referral to Neurology, Vitamin B12, TSH + free T4, Magnesium, MR Brain Wo Contrast  Facial numbness - Plan: Ambulatory referral to Neurology, Vitamin B12, TSH + free T4, Magnesium, MR Brain Wo Contrast  Brain fog - Plan: Ambulatory referral to Neurology, Vitamin B12, TSH + free T4, Magnesium, Iron, TIBC and Ferritin Panel, MR Brain Wo Contrast  Generalized anxiety disorder - Plan: busPIRone  (BUSPAR ) 7.5 MG tablet    I reviewed the ER notes.  Will obtain some extra labs and/or of course refer to neurology.  Have also placed MRI of the brain in anticipation of that visit.  Will consider alternative diagnoses to complex migraine including MS and maybe even a seizure disorder but I think the latter less likely.  We discussed red flag signs and symptoms that would precipitate her need to her to reach out to me.  I have reordered BuSpar  for situational anxiety  Influenza vaccination administered   Cheryl CHRISTELLA Fielding, DO [facility] [phone]

## 2024-03-29 NOTE — Patient Instructions (Signed)
 I ordered some labs AND MRI of the brain (because it will probably be ordered by neuro anyway and at least we'll have some information for them before you get seen there)

## 2024-03-30 ENCOUNTER — Ambulatory Visit: Payer: Self-pay | Admitting: Family Medicine

## 2024-03-30 ENCOUNTER — Ambulatory Visit (INDEPENDENT_AMBULATORY_CARE_PROVIDER_SITE_OTHER): Admitting: *Deleted

## 2024-03-30 DIAGNOSIS — R7989 Other specified abnormal findings of blood chemistry: Secondary | ICD-10-CM

## 2024-03-30 DIAGNOSIS — E538 Deficiency of other specified B group vitamins: Secondary | ICD-10-CM

## 2024-03-30 DIAGNOSIS — E611 Iron deficiency: Secondary | ICD-10-CM

## 2024-03-30 LAB — IRON,TIBC AND FERRITIN PANEL
Ferritin: 50 ng/mL (ref 15–150)
Iron Saturation: 8 % — CL (ref 15–55)
Iron: 25 ug/dL — ABNORMAL LOW (ref 27–159)
Total Iron Binding Capacity: 302 ug/dL (ref 250–450)
UIBC: 277 ug/dL (ref 131–425)

## 2024-03-30 LAB — TSH+FREE T4
Free T4: 0.95 ng/dL (ref 0.82–1.77)
TSH: 15 u[IU]/mL — ABNORMAL HIGH (ref 0.450–4.500)

## 2024-03-30 LAB — VITAMIN B12: Vitamin B-12: 220 pg/mL — ABNORMAL LOW (ref 232–1245)

## 2024-03-30 LAB — MAGNESIUM: Magnesium: 2.1 mg/dL (ref 1.6–2.3)

## 2024-03-30 MED ORDER — CYANOCOBALAMIN 1000 MCG/ML IJ SOLN
1000.0000 ug | INTRAMUSCULAR | Status: AC
  Administered 2024-03-30 – 2024-04-21 (×4): 1000 ug via INTRAMUSCULAR

## 2024-03-30 MED ORDER — SLOW IRON 160 (50 FE) MG PO TBCR: 160.0000 mg | EXTENDED_RELEASE_TABLET | Freq: Every day | ORAL | 0 refills | Status: DC

## 2024-03-30 NOTE — Progress Notes (Signed)
 Patient is in office today for a nurse visit for B12 Injection. Injection was given in the  Left deltoid. Patient tolerated injection well.

## 2024-03-31 ENCOUNTER — Ambulatory Visit (HOSPITAL_COMMUNITY)
Admission: RE | Admit: 2024-03-31 | Discharge: 2024-03-31 | Disposition: A | Discharge status: Home / Self Care | Source: Ambulatory Visit | Attending: Family Medicine | Admitting: Family Medicine

## 2024-03-31 ENCOUNTER — Inpatient Hospital Stay: Admitting: Nurse Practitioner

## 2024-03-31 DIAGNOSIS — R200 Anesthesia of skin: Secondary | ICD-10-CM | POA: Insufficient documentation

## 2024-03-31 DIAGNOSIS — F40240 Claustrophobia: Secondary | ICD-10-CM | POA: Diagnosis not present

## 2024-03-31 DIAGNOSIS — R2990 Unspecified symptoms and signs involving the nervous system: Secondary | ICD-10-CM | POA: Insufficient documentation

## 2024-03-31 DIAGNOSIS — R4189 Other symptoms and signs involving cognitive functions and awareness: Secondary | ICD-10-CM | POA: Insufficient documentation

## 2024-04-04 ENCOUNTER — Encounter: Payer: Self-pay | Admitting: Family Medicine

## 2024-04-04 ENCOUNTER — Encounter: Payer: Self-pay | Admitting: Neurology

## 2024-04-05 ENCOUNTER — Other Ambulatory Visit: Payer: Self-pay | Admitting: Family Medicine

## 2024-04-05 DIAGNOSIS — B001 Herpesviral vesicular dermatitis: Secondary | ICD-10-CM

## 2024-04-05 MED ORDER — VALACYCLOVIR HCL 1 G PO TABS: 2000.0000 mg | ORAL_TABLET | Freq: Two times a day (BID) | ORAL | 0 refills | Status: DC

## 2024-04-06 ENCOUNTER — Ambulatory Visit (INDEPENDENT_AMBULATORY_CARE_PROVIDER_SITE_OTHER): Admitting: *Deleted

## 2024-04-06 DIAGNOSIS — E538 Deficiency of other specified B group vitamins: Secondary | ICD-10-CM | POA: Diagnosis not present

## 2024-04-06 NOTE — Progress Notes (Addendum)
 Patient is in office today for a nurse visit for B12 Injection.  Injection was given in the  Right deltoid. Patient tolerated injection well.

## 2024-04-07 ENCOUNTER — Ambulatory Visit

## 2024-04-13 ENCOUNTER — Ambulatory Visit (INDEPENDENT_AMBULATORY_CARE_PROVIDER_SITE_OTHER): Admitting: *Deleted

## 2024-04-13 DIAGNOSIS — E538 Deficiency of other specified B group vitamins: Secondary | ICD-10-CM

## 2024-04-13 NOTE — Progress Notes (Signed)
 Patient is in office today for a nurse visit for B12 Injection. Patient Injection was given in the  Left deltoid. Patient tolerated injection well.

## 2024-04-14 ENCOUNTER — Telehealth: Payer: Self-pay | Admitting: *Deleted

## 2024-04-14 ENCOUNTER — Other Ambulatory Visit: Payer: Self-pay | Admitting: *Deleted

## 2024-04-14 ENCOUNTER — Ambulatory Visit

## 2024-04-14 DIAGNOSIS — E1159 Type 2 diabetes mellitus with other circulatory complications: Secondary | ICD-10-CM

## 2024-04-14 DIAGNOSIS — E119 Type 2 diabetes mellitus without complications: Secondary | ICD-10-CM

## 2024-04-14 DIAGNOSIS — K760 Fatty (change of) liver, not elsewhere classified: Secondary | ICD-10-CM

## 2024-04-14 NOTE — Telephone Encounter (Signed)
 Spoke to sister per signed dpr. Gave results.

## 2024-04-18 NOTE — Telephone Encounter (Signed)
 Appointment scheduled and confirmed with family.

## 2024-04-18 NOTE — Telephone Encounter (Signed)
 Kim offer him that 3pm slot today for eval. I'll put him in but call his sister and verify she can bring him

## 2024-04-21 ENCOUNTER — Ambulatory Visit (INDEPENDENT_AMBULATORY_CARE_PROVIDER_SITE_OTHER): Admitting: *Deleted

## 2024-04-21 DIAGNOSIS — E538 Deficiency of other specified B group vitamins: Secondary | ICD-10-CM

## 2024-04-21 NOTE — Progress Notes (Signed)
 Patient is in office today for a nurse visit for B12 Injection. Patient Injection was given in the  Right deltoid. Patient tolerated injection well.

## 2024-05-09 ENCOUNTER — Encounter: Payer: Self-pay | Admitting: Family Medicine

## 2024-05-09 ENCOUNTER — Other Ambulatory Visit: Payer: Self-pay | Admitting: Family Medicine

## 2024-05-09 ENCOUNTER — Ambulatory Visit
Admission: RE | Admit: 2024-05-09 | Discharge: 2024-05-09 | Disposition: A | Source: Ambulatory Visit | Attending: Family Medicine

## 2024-05-09 ENCOUNTER — Ambulatory Visit: Payer: Self-pay | Admitting: Family Medicine

## 2024-05-09 VITALS — BP 105/79 | HR 82 | Temp 97.3°F | Ht 66.0 in | Wt 264.0 lb

## 2024-05-09 DIAGNOSIS — F411 Generalized anxiety disorder: Secondary | ICD-10-CM

## 2024-05-09 DIAGNOSIS — E119 Type 2 diabetes mellitus without complications: Secondary | ICD-10-CM

## 2024-05-09 DIAGNOSIS — G43009 Migraine without aura, not intractable, without status migrainosus: Secondary | ICD-10-CM | POA: Diagnosis not present

## 2024-05-09 DIAGNOSIS — Z789 Other specified health status: Secondary | ICD-10-CM | POA: Diagnosis not present

## 2024-05-09 DIAGNOSIS — Z7985 Long-term (current) use of injectable non-insulin antidiabetic drugs: Secondary | ICD-10-CM | POA: Diagnosis not present

## 2024-05-09 DIAGNOSIS — E611 Iron deficiency: Secondary | ICD-10-CM | POA: Diagnosis not present

## 2024-05-09 DIAGNOSIS — F418 Other specified anxiety disorders: Secondary | ICD-10-CM | POA: Diagnosis not present

## 2024-05-09 DIAGNOSIS — K219 Gastro-esophageal reflux disease without esophagitis: Secondary | ICD-10-CM | POA: Diagnosis not present

## 2024-05-09 DIAGNOSIS — Z1231 Encounter for screening mammogram for malignant neoplasm of breast: Secondary | ICD-10-CM

## 2024-05-09 DIAGNOSIS — I152 Hypertension secondary to endocrine disorders: Secondary | ICD-10-CM

## 2024-05-09 DIAGNOSIS — E785 Hyperlipidemia, unspecified: Secondary | ICD-10-CM | POA: Diagnosis not present

## 2024-05-09 DIAGNOSIS — K640 First degree hemorrhoids: Secondary | ICD-10-CM

## 2024-05-09 DIAGNOSIS — E1169 Type 2 diabetes mellitus with other specified complication: Secondary | ICD-10-CM | POA: Diagnosis not present

## 2024-05-09 DIAGNOSIS — R7989 Other specified abnormal findings of blood chemistry: Secondary | ICD-10-CM | POA: Diagnosis not present

## 2024-05-09 DIAGNOSIS — E1159 Type 2 diabetes mellitus with other circulatory complications: Secondary | ICD-10-CM | POA: Diagnosis not present

## 2024-05-09 LAB — BAYER DCA HB A1C WAIVED: HB A1C (BAYER DCA - WAIVED): 5.3 % (ref 4.8–5.6)

## 2024-05-09 MED ORDER — SUMATRIPTAN SUCCINATE 50 MG PO TABS: 50.0000 mg | ORAL_TABLET | Freq: Every day | ORAL | 99 refills | Status: AC | PRN

## 2024-05-09 MED ORDER — BUSPIRONE HCL 7.5 MG PO TABS: 7.5000 mg | ORAL_TABLET | Freq: Three times a day (TID) | ORAL | 3 refills | Status: AC

## 2024-05-09 MED ORDER — SLOW IRON 160 (50 FE) MG PO TBCR: 160.0000 mg | EXTENDED_RELEASE_TABLET | Freq: Every day | ORAL | 3 refills | Status: AC

## 2024-05-09 MED ORDER — METOPROLOL TARTRATE 50 MG PO TABS: ORAL_TABLET | ORAL | 3 refills | Status: AC

## 2024-05-09 MED ORDER — PANTOPRAZOLE SODIUM 40 MG PO TBEC: 40.0000 mg | DELAYED_RELEASE_TABLET | Freq: Two times a day (BID) | ORAL | 3 refills | Status: AC

## 2024-05-09 MED ORDER — ROSUVASTATIN CALCIUM 10 MG PO TABS: 10.0000 mg | ORAL_TABLET | Freq: Every day | ORAL | 3 refills | Status: AC

## 2024-05-09 MED ORDER — CITALOPRAM HYDROBROMIDE 40 MG PO TABS: 40.0000 mg | ORAL_TABLET | Freq: Every day | ORAL | 3 refills | Status: AC

## 2024-05-09 MED ORDER — PROCTOFOAM HC 1-1 % EX FOAM: 1.0000 | Freq: Two times a day (BID) | CUTANEOUS | 1 refills | Status: AC

## 2024-05-09 NOTE — Progress Notes (Signed)
 Subjective: CC:DM PCP: [name], DO YEP:Cheryl Randall is a 48 y.o. female presenting to clinic today for:  Type 2 Diabetes with hypertension, hyperlipidemia:  She is compliant with her medications.  Since our last visit she has been walking regularly throughout the week and she has totally eliminated sodas.  She feels better and notes improvement in overall health with reduction in her weight.  She reports no GI side effects of the GIP/GLP.  No hypoglycemia reported.  No chest pain, shortness of breath or visual disturbance.  She does report constipation but she is not taking anything for this currently.  Sadly this is caused some hemorrhoids that have been painful with straining.  Needing new prescription as OTCs are not helpful at this time   Diabetes Health Maintenance Due  Topic Date Due   FOOT EXAM  Never done   OPHTHALMOLOGY EXAM  Never done   HEMOGLOBIN A1C  07/03/2024    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   Deep incisional surgical site infection 07/01/2015   Diabetes mellitus without complication (HCC)    borderline; diet controlled   Dysrhythmia    Essential hypertension 07/26/2019   Family history of adverse reaction to anesthesia    Patients son has bad N/V   GAD (generalized anxiety disorder) 06/26/2017   Generalized edema 03/24/2017   GERD (gastroesophageal reflux disease)    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   Migraine    Palpitations 07/26/2019   Panic attack 06/26/2017   PONV (postoperative nausea and vomiting)    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    Staphylococcus aureus bacteremia with sepsis (HCC)    Wound infection 06/30/2015    Current Outpatient Medications:    albuterol  (VENTOLIN  HFA) 108 (90 Base) MCG/ACT inhaler, Inhale 1-2 puffs into the lungs every 6 (six) hours as needed for wheezing or shortness of breath., Disp: 1 each, Rfl: 0   azelastine  (ASTELIN ) 0.1 % nasal spray, Place 1 spray into both nostrils 2 (two) times daily. Use in each nostril as directed, Disp: 30 mL, Rfl: 12   Blood Glucose Monitoring Suppl DEVI, Check sugars up to 2 times daily. May substitute to any manufacturer covered by patient's insurance. E11.9, Disp: 1 each, Rfl: 0   busPIRone  (BUSPAR ) 7.5 MG tablet, Take 1 tablet (7.5 mg total) by mouth 3 (three) times daily. For anxiety, Disp: 270 tablet, Rfl: 0   citalopram  (CELEXA ) 40 MG tablet, Take 1 tablet (40 mg total) by mouth daily., Disp: 90 tablet, Rfl: 3   ferrous sulfate (SLOW IRON ) 160 (50 Fe) MG TBCR SR tablet, Take 1 tablet (160 mg total) by mouth daily., Disp: 90 tablet, Rfl: 0   glucose blood (ACCU-CHEK GUIDE TEST) test strip, Test BS BID Dx E11.9, Disp: 200 each, Rfl: 3   guaiFENesin -codeine  100-10 MG/5ML syrup, Take 5 mLs by mouth every 6 (six) hours as needed for cough., Disp: 120 mL, Rfl: 0   hydrocortisone  (ANUSOL -HC) 2.5 % rectal cream, Place 1 Application rectally 2 (two) times daily as needed for hemorrhoids or anal itching. X5 days per flare up of  hemorrhoid., Disp: 30 g, Rfl: 0   Lancet Device MISC, Check sugar up to 2 times daily. May substitute to any manufacturer covered by patient's insurance. E11.9, Disp: 1 each, Rfl: 0   Lancets Misc. MISC, Check sugar up to 2 times daily. May substitute to any manufacturer covered by patient's insurance.E11.9, Disp: 100 each, Rfl: 3   lidocaine  (ASPERCREME LIDOCAINE ) 4 %, Place 1 patch onto the skin daily as needed (pain)., Disp: , Rfl:    meclizine  (ANTIVERT ) 25 MG tablet, Take 1 tablet (25 mg total) by mouth 3 (three) times daily as needed for  dizziness., Disp: 30 tablet, Rfl: 0   methocarbamol  (ROBAXIN ) 750 MG tablet, Take 1 tablet (750 mg total) by mouth every 8 (eight) hours as needed for muscle spasms., Disp: 90 tablet, Rfl: 0   metoprolol  tartrate (LOPRESSOR ) 50 MG tablet, TAKE ONE (1) TABLET BY MOUTH TWO (2) TIMES DAILY, Disp: 180 tablet, Rfl: 3   MOUNJARO  15 MG/0.5ML Pen, INJECT 15MG  INTO THE SKIN ONCE A WEEK, Disp: 6 mL, Rfl: 3   ondansetron  (ZOFRAN -ODT) 4 MG disintegrating tablet, Take 1 tablet (4 mg total) by mouth every 8 (eight) hours as needed for nausea or vomiting., Disp: 20 tablet, Rfl: 0   pantoprazole  (PROTONIX ) 40 MG tablet, Take 1 tablet (40 mg total) by mouth 2 (two) times daily before a meal., Disp: 90 tablet, Rfl: 3   rosuvastatin  (CRESTOR ) 10 MG tablet, Take 1 tablet (10 mg total) by mouth daily. For cholesterol/ fatty liver, Disp: 90 tablet, Rfl: 3   SUMAtriptan  (IMITREX ) 50 MG tablet, Take 1 tablet (50 mg total) by mouth daily as needed for migraine. May repeat in 4 hours if headache persists or recurs., Disp: 6 tablet, Rfl: PRN   valACYclovir  (VALTREX ) 1000 MG tablet, Take 2 tablets (2,000 mg total) by mouth 2 (two) times daily. X1 day per flare up of fever blister., Disp: 20 tablet, Rfl: 0 Social History   Socioeconomic History   Marital status: Divorced    Spouse name: Not on file   Number of children: 1   Years of education: Not on file   Highest education level: Some college, no degree  Occupational History   Not on file  Tobacco Use   Smoking status: Former    Current packs/day: 0.00    Types: Cigarettes    Quit date: 06/02/2014    Years since quitting: 9.9   Smokeless tobacco: Never  Vaping Use   Vaping status: Never Used  Substance and Sexual Activity   Alcohol use: No   Drug use: Not Currently    Types: Marijuana    Comment: none since a teenager   Sexual activity: Not Currently    Birth control/protection: Surgical  Other Topics Concern   Not on file  Social History Narrative   Not on  file   Social Drivers of Health   Financial Resource Strain: Not on file  Food Insecurity: No Food Insecurity (02/28/2022)   Hunger Vital Sign    Worried About Running Out of Food in the Last Year: Never true    Ran Out of Food in the Last Year: Never true  Transportation Needs: No Transportation Needs (02/28/2022)   PRAPARE - Administrator, Civil Service (Medical): No    Lack of Transportation (Non-Medical): No  Physical Activity: Not on file  Stress: Not on file  Social Connections: Not on file  Intimate Partner Violence: Not on file   Family History  Problem Relation Age  of Onset   Heart disease Mother    Diabetes Mother    Cancer Father        bladder   Autism Brother    Diabetes Brother    Heart disease Brother    Diabetes Brother    Diabetes Brother    Breast cancer Maternal Aunt        double mastectomy    Objective: Office vital signs reviewed. BP 105/79   Pulse 82   Temp (!) 97.3 F (36.3 C)   Ht 5' 6 (1.676 m)   Wt 264 lb (119.7 kg)   LMP 02/24/2013   SpO2 94%   BMI 42.61 kg/m   Physical Examination:  General: Awake, alert, well nourished, No acute distress HEENT: sclera white, MMM Cardio: regular rate and rhythm, S1S2 heard, no murmurs appreciated Pulm: clear to auscultation bilaterally, no wheezes, rhonchi or rales; normal work of breathing on room air Diabetic Foot Exam - Simple   Simple Foot Form Diabetic Foot exam was performed with the following findings: Yes 05/09/2024  3:36 PM  Visual Inspection No deformities, no ulcerations, no other skin breakdown bilaterally: Yes Sensation Testing Intact to touch and monofilament testing bilaterally: Yes Pulse Check Posterior Tibialis and Dorsalis pulse intact bilaterally: Yes Comments     Lab Results  Component Value Date   HGBA1C 5.3 05/09/2024    Assessment/ Plan: 48 y.o. female   Diabetes mellitus treated with injections of non-insulin medication (HCC) - Plan: Bayer DCA Hb A1c  Waived  Hypertension associated with diabetes (HCC)  Hyperlipidemia associated with type 2 diabetes mellitus (HCC)  Abnormal thyroid  blood test - Plan: Thyroid  antibodies (Thyroperoxidase & Thyroglobulin), TSH + free T4  Hepatitis B vaccination status unknown - Plan: Hepatitis B surface antibody,qualitative  Grade I hemorrhoids - Plan: hydrocortisone -pramoxine (PROCTOFOAM  HC) rectal foam  Generalized anxiety disorder - Plan: busPIRone  (BUSPAR ) 7.5 MG tablet  Depression with anxiety - Plan: citalopram  (CELEXA ) 40 MG tablet  Iron  deficiency - Plan: ferrous sulfate (SLOW IRON ) 160 (50 Fe) MG TBCR SR tablet  Gastroesophageal reflux disease without esophagitis - Plan: pantoprazole  (PROTONIX ) 40 MG tablet  Migraine without aura and without status migrainosus, not intractable - Plan: SUMAtriptan  (IMITREX ) 50 MG tablet  Well-controlled diabetes.  Diabetic foot exam performed today.  ROI for diabetic eye exam completed.  She will continue all medications as prescribed  Check thyroid  levels and check for thyroid  antibodies given abnormalities noted on last lab draw.  Patient also was agreeable to hepatitis B titers to evaluate for any possible need for vaccination  I have ordered Proctofoam  HC given refractory symptoms to previously prescribed medications and OTCs.  We discussed bowel regimen including utilization of whole foods like kiwis for natural relief of constipation and/or use of stool softener like Colace.  Continue to push oral fluids and continue to stay physically active  We did not discuss her mental health, iron  deficiency, GERD or migraine headaches but she needed refills on all medications prior to her next visit so these were sent  Norene CHRISTELLA Fielding, DO [facility] [phone]

## 2024-05-10 ENCOUNTER — Ambulatory Visit: Payer: Self-pay | Admitting: Family Medicine

## 2024-05-10 LAB — TSH+FREE T4
Free T4: 1.12 ng/dL (ref 0.82–1.77)
TSH: 3.99 u[IU]/mL (ref 0.450–4.500)

## 2024-05-10 LAB — HEPATITIS B SURFACE ANTIBODY,QUALITATIVE: Hep B Surface Ab, Qual: REACTIVE

## 2024-05-10 LAB — THYROID ANTIBODIES (THYROPEROXIDASE & THYROGLOBULIN)
Thyroglobulin Antibody: 6.9 [IU]/mL — ABNORMAL HIGH (ref 0.0–0.9)
Thyroperoxidase Ab SerPl-aCnc: 86 [IU]/mL — ABNORMAL HIGH (ref 0–34)

## 2024-05-23 ENCOUNTER — Ambulatory Visit

## 2024-05-23 NOTE — Progress Notes (Unsigned)
 "  NEUROLOGY CONSULTATION NOTE  Cheryl Randall MRN: [id number] DOB: May 22, 1976  Referring provider: Norene Fielding, DO Primary care provider: Norene Fielding, DO  Reason for consult:  stroke-like symptoms  Assessment/Plan:   Migraine with aura, without status migrainosus, not intractable/migraine with brainstem aura Migraine without aura, without status migrainosus, not intractable  Migraine prevention:  Not indicated at this time Migraine rescue:  Will have her try samples of Nurtec; Zofran -ODT 4mg  for nausea.  Would discontinue triptan due to history of migraine with stroke-like/hemiplegic symptoms Lifestyle modification: Limit use of pain relievers to no more than 9 days out of the month to prevent risk of rebound or medication-overuse headache. Diet modification/hydration/caffeine cessation Routine exercise Sleep hygiene Consider vitamins/supplements:  magnesium citrate 400mg  daily, riboflavin 400mg  daily, CoQ10 100mg  three times daily Keep headache diary Follow up 6 months.    Subjective:  Cheryl Randall is a 48 year old right-handed female with HTN, IBS, chronic pain syndrome, and GAD who presents for stroke like symptoms.  History supplemented by ED and PCP notes.  03/23/2024 - vertigo, nystagmus, right sided facial numbness and right upper and lower extremity numbness.  Seen in ED.  .  No headache at the time, but aftewards dull headache, residual right rfacial numbness and brain fog.  Dx with complex migraine.    Discussed the use of AI scribe software for clinical note transcription with the patient, who gave verbal consent to proceed.  History of Present Illness Cheryl Randall is a 48 year old female with a history of migraines who presents with a recent episode of complex migraine.  On 03/23/2024, she experienced a sudden onset of eye movements described as 'jumping back and forth' while sitting in church, accompanied by lightheadedness and spinning. She also  endorsed right ptosis, right-sided facial numbness and right arm and leg numbness but no associated weakness.  Her sister measured her blood pressure at 149/100, prompting an ambulance call. Symptoms lasted 20-30 minutes.  A moderate headache developed immediately after these symptoms subsided, located bilaterally in the frontal region and behind the right eye, described as a throbbing sensation. She also reported tingling in her face and arm, nausea, sensitivity to light and sound.  At the hospital, initial concerns were for stroke symptoms, but it was later determined to be a complex migraine.  CT and CTA negative.  Followed up with PCP.  MRI of brain without contrast on 03/31/2024 was limited due to claustrophobia but no obvious acute intracranial abnormality.  She has not had a recurrence.  Her typical migraines, which began in her early twenties, usually occur on the right side and are associated with nausea, sensitivity to light and sound, and sometimes visual symptoms like right eye drooping. These migraines occur once every couple of months, more frequently with stress, and are relieved by sumatriptan  (Imitrex ), which she takes as needed. She describes the pain as a squeezing sensation that releases after medication.  She has tried over-the-counter medications like ibuprofen , Excedrin, and Tylenol  for her migraines, but they are ineffective. She experiences chronic neck pain, particularly on the right side, which is tender during migraines.  She has no family history of migraines and no known history of concussions, although she was hit in the head with a softball in her teens. She has undergone multiple back surgeries resulting in nerve damage on the right leg, affecting sensation.   Past NSAIDS/analgesics:  diclofenac , acetaminophen , ibuprofen , Excedrin Migraine, acetaminophen  Past abortive triptans:  rizatriptan  Past abortive ergotamine:  none Past muscle relaxants:  cyclobenzaprine  Past  anti-emetic:  none Past antihypertensive medications:  lisinopril , furosemide  Past antidepressant medications:  sertraline  Past anticonvulsant medications:  pregablin Past anti-CGRP:  none Past vitamins/Herbal/Supplements:  none Past antihistamines/decongestants:  cetirizine  Other past therapies:  none  Current NSAIDS/analgesics:  none Current triptans:  sumatriptan  50mg  Current ergotamine:  none Current anti-emetic:  ondansetron -ODT 4mg  Current muscle relaxants:  methocarbamol  750mg  Current Antihypertensive medications:  metoprolol  50mg  BID Current Antidepressant medications:  citalopram  40mg  daily Current Anticonvulsant medications:  none Current anti-CGRP:  none Current Vitamins/Herbal/Supplements:  none Current Antihistamines/Decongestants:  meclizine  25mg , Astelin  Other therapy:  none Other medications:  buspirione, Mounjaro     PAST MEDICAL HISTORY: 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   Deep incisional surgical site infection 07/01/2015   Diabetes mellitus without complication (HCC)    borderline; diet controlled   Dysrhythmia    Essential hypertension 07/26/2019   Family history of adverse reaction to anesthesia    Patients son has bad N/V   GAD (generalized anxiety disorder) 06/26/2017   Generalized edema 03/24/2017   GERD (gastroesophageal reflux disease)    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   Migraine    Palpitations 07/26/2019   Panic attack 06/26/2017   PONV (postoperative nausea and vomiting)    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   Staphylococcus aureus bacteremia with sepsis (HCC)    Wound infection 06/30/2015    PAST SURGICAL HISTORY: Past Surgical History:  Procedure Laterality Date   ABDOMINAL HYSTERECTOMY     20s   BACK SURGERY     x4    BIOPSY  12/15/2022   Procedure: BIOPSY;  Surgeon: Cindie Carlin POUR, DO;  Location: [facility];  Service: Endoscopy;;   BREAST SURGERY Bilateral    reduction   CHOLECYSTECTOMY     COLONOSCOPY WITH PROPOFOL  N/A 12/15/2022   Procedure: COLONOSCOPY WITH PROPOFOL ;  Surgeon: Cindie Carlin POUR, DO;  Location: [facility];  Service: Endoscopy;  Laterality: N/A;  10:30 am, asa 3   ESOPHAGOGASTRODUODENOSCOPY (EGD) WITH PROPOFOL  N/A 12/15/2022   Procedure: ESOPHAGOGASTRODUODENOSCOPY (EGD) WITH PROPOFOL ;  Surgeon: Cindie Carlin POUR, DO;  Location: [facility];  Service: Endoscopy;  Laterality: N/A;   LUMBAR LAMINECTOMY/DECOMPRESSION MICRODISCECTOMY N/A 06/30/2015   Procedure: wound exploration irrigation and debridement ,explantation of bone growth stimulator, placement of wound vac;  Surgeon: Morene Hicks Ditty, MD;  Location: [facility];  Service: Neurosurgery;  Laterality: N/A;   REDUCTION MAMMAPLASTY Bilateral 2004    MEDICATIONS: Medications Ordered Prior to Encounter[1]  ALLERGIES: Allergies[2]  FAMILY HISTORY: Family History  Problem Relation Age of Onset   Heart disease Mother    Diabetes Mother    Cancer Father        bladder   Autism Brother    Diabetes Brother    Heart disease Brother    Diabetes Brother    Diabetes Brother    Breast cancer Maternal Aunt        double mastectomy    Objective:  Blood pressure 116/82, pulse 84, height 5' 7 (1.702 m), weight 263 lb (119.3 kg), last menstrual period 02/24/2013, SpO2 96%. General: No acute distress.  Patient appears well-groomed.   Head:  Normocephalic/atraumatic Eyes:  fundi examined but not visualized Neck: supple, mild right sided paraspinal tenderness, full range of motion Heart:  regular rate and rhythm Neurological Exam: Mental status: alert and oriented to person, place, and time, speech fluent and not dysarthric, language intact. Cranial nerves: CN I: not tested CN II: pupils equal, round and reactive  to light, visual fields intact CN III, IV, VI:  full range of motion, no nystagmus, no ptosis CN V: endorses trace reduced sensation of right V1-V3. CN VII: upper and lower face symmetric CN VIII: hearing intact CN IX, X: gag intact, uvula midline CN XI: sternocleidomastoid and trapezius muscles intact CN XII: tongue midline Bulk & Tone: normal, no fasciculations. Motor:  muscle strength 5/5 throughout Sensation:  Pinprick sensation slightly reduced in right upper extremity.  Significantly reduced pinprick sensation and vibratory sensation in right lower extremity related to lumbar radiculopathy with subsequent back surgeries. Deep Tendon Reflexes:  2+ throughout,  toes downgoing.   Finger to nose testing:  Without dysmetria.   Gait:  Normal station and stride.  Romberg negative.    Thank you for allowing me to take part in the care of this patient.  Juliene Dunnings, DO  CC: Norene Fielding, DO        [1]  Current Outpatient Medications on File Prior to Visit  Medication Sig Dispense Refill   albuterol  (VENTOLIN  HFA) 108 (90 Base) MCG/ACT inhaler Inhale 1-2 puffs into the lungs every 6 (six) hours as needed for wheezing or shortness of breath. 1 each 0   azelastine  (ASTELIN ) 0.1 % nasal spray Place 1 spray into both nostrils 2 (two) times daily. Use in each nostril as directed 30 mL 12   Blood Glucose Monitoring Suppl DEVI Check sugars up to 2 times daily. May substitute to any manufacturer covered by patient's insurance. E11.9 1 each 0   busPIRone  (BUSPAR ) 7.5 MG tablet Take 1 tablet (7.5 mg total) by mouth 3 (three) times daily. For anxiety 270 tablet 3   citalopram  (CELEXA ) 40 MG tablet Take 1 tablet (40 mg total) by mouth daily. 90 tablet 3   ferrous sulfate (SLOW IRON ) 160 (50 Fe) MG TBCR SR tablet Take 1 tablet (160 mg total) by mouth daily. 90 tablet 3   glucose blood (ACCU-CHEK GUIDE TEST) test strip Test BS BID Dx E11.9 200 each 3   hydrocortisone  (ANUSOL -HC) 2.5 % rectal  cream Place 1 Application rectally 2 (two) times daily as needed for hemorrhoids or anal itching. X5 days per flare up of hemorrhoid. 30 g 0   hydrocortisone -pramoxine (PROCTOFOAM  HC) rectal foam Place 1 applicator rectally 2 (two) times daily. X3-5 days as needed for painful hemorrhoids 10 g 1   Lancet Device MISC Check sugar up to 2 times daily. May substitute to any manufacturer covered by patient's insurance. E11.9 1 each 0   Lancets Misc. MISC Check sugar up to 2 times daily. May substitute to any manufacturer covered by patient's insurance.E11.9 100 each 3   lidocaine  (ASPERCREME LIDOCAINE ) 4 % Place 1 patch onto the skin daily as needed (pain).     meclizine  (ANTIVERT ) 25 MG tablet Take 1 tablet (25 mg total) by mouth 3 (three) times daily as needed for dizziness. 30 tablet 0   methocarbamol  (ROBAXIN ) 750 MG tablet Take 1 tablet (750 mg total) by mouth every 8 (eight) hours as needed for muscle spasms. 90 tablet 0   metoprolol  tartrate (LOPRESSOR ) 50 MG tablet TAKE ONE (1) TABLET BY MOUTH TWO (2) TIMES DAILY 180 tablet 3   MOUNJARO  15 MG/0.5ML Pen INJECT 15MG  INTO THE SKIN ONCE A WEEK 6 mL 3  ondansetron  (ZOFRAN -ODT) 4 MG disintegrating tablet Take 1 tablet (4 mg total) by mouth every 8 (eight) hours as needed for nausea or vomiting. 20 tablet 0   pantoprazole  (PROTONIX ) 40 MG tablet Take 1 tablet (40 mg total) by mouth 2 (two) times daily before a meal. 90 tablet 3   rosuvastatin  (CRESTOR ) 10 MG tablet Take 1 tablet (10 mg total) by mouth daily. For cholesterol/ fatty liver 90 tablet 3   SUMAtriptan  (IMITREX ) 50 MG tablet Take 1 tablet (50 mg total) by mouth daily as needed for migraine. May repeat in 4 hours if headache persists or recurs. 6 tablet PRN   valACYclovir  (VALTREX ) 1000 MG tablet Take 2 tablets (2,000 mg total) by mouth 2 (two) times daily. X1 day per flare up of fever blister. 20 tablet 0   No current facility-administered medications on file prior to visit.  [2]   Allergies Allergen Reactions   Codeine  Itching and Rash   Latex Itching and Rash   Norco [Hydrocodone -Acetaminophen ] Itching and Rash   Tape Itching and Rash   "

## 2024-05-24 ENCOUNTER — Ambulatory Visit: Admitting: Neurology

## 2024-05-24 ENCOUNTER — Ambulatory Visit

## 2024-05-24 ENCOUNTER — Encounter: Payer: Self-pay | Admitting: Neurology

## 2024-05-24 VITALS — BP 116/82 | HR 84 | Ht 67.0 in | Wt 263.0 lb

## 2024-05-24 DIAGNOSIS — E538 Deficiency of other specified B group vitamins: Secondary | ICD-10-CM | POA: Diagnosis not present

## 2024-05-24 DIAGNOSIS — G43109 Migraine with aura, not intractable, without status migrainosus: Secondary | ICD-10-CM | POA: Diagnosis not present

## 2024-05-24 MED ORDER — NURTEC 75 MG PO TBDP: ORAL_TABLET | ORAL | Status: AC

## 2024-05-24 MED ORDER — ONDANSETRON 4 MG PO TBDP: 4.0000 mg | ORAL_TABLET | Freq: Three times a day (TID) | ORAL | 5 refills | Status: AC | PRN

## 2024-05-24 MED ORDER — CYANOCOBALAMIN 1000 MCG/ML IJ SOLN
1000.0000 ug | Freq: Once | INTRAMUSCULAR | Status: AC
  Administered 2024-05-24: 1000 ug via INTRAMUSCULAR

## 2024-05-24 NOTE — Progress Notes (Signed)
 Patient is in office today for a nurse visit for B12 Injection. Patient Injection was given in the  Right deltoid. Patient tolerated injection well.

## 2024-05-24 NOTE — Patient Instructions (Signed)
" °  Take NURTEC at earliest onset of headache.  Maximum 1 tablet in 24 hours.  Give me update.  STOP SUMATRIPTAN  ONDANSETRON  for nausea Limit use of pain relievers to no more than 9 days out of the month.  These medications include acetaminophen , NSAIDs (ibuprofen /Advil /Motrin , naproxen/Aleve, triptans (Imitrex /sumatriptan ), Excedrin, and narcotics.  This will help reduce risk of rebound headaches. Be aware of common food triggers:  - Caffeine:  coffee, black tea, cola, Mt. Dew  - Chocolate  - Dairy:  aged cheeses (brie, blue, cheddar, gouda, Parmasan, provolone, romano, Swiss, etc), chocolate milk, buttermilk, sour cream, limit eggs and yogurt  - Nuts, peanut butter  - Alcohol  - Cereals/grains:  FRESH breads (fresh bagels, sourdough, doughnuts), yeast productions  - Processed/canned/aged/cured meats (pre-packaged deli meats, hotdogs)  - MSG/glutamate:  soy sauce, flavor enhancer, pickled/preserved/marinated foods  - Sweeteners:  aspartame (Equal, Nutrasweet).  Sugar and Splenda are okay  - Vegetables:  legumes (lima beans, lentils, snow peas, fava beans, pinto peans, peas, garbanzo beans), sauerkraut, onions, olives, pickles  - Fruit:  avocados, bananas, citrus fruit (orange, lemon, grapefruit), mango  - Other:  Frozen meals, macaroni and cheese Routine exercise Stay adequately hydrated (aim for 64 oz water  daily) Keep headache diary Maintain proper stress management Maintain proper sleep hygiene Do not skip meals Consider supplements:  magnesium citrate 400mg  daily, riboflavin 400mg  daily, coenzyme Q10 100mg  three times daily.  "

## 2024-06-03 ENCOUNTER — Other Ambulatory Visit: Payer: Self-pay | Admitting: Family Medicine

## 2024-06-03 DIAGNOSIS — B001 Herpesviral vesicular dermatitis: Secondary | ICD-10-CM

## 2024-06-06 ENCOUNTER — Ambulatory Visit: Payer: Self-pay

## 2024-06-06 ENCOUNTER — Ambulatory Visit: Admitting: Nurse Practitioner

## 2024-06-06 ENCOUNTER — Encounter: Payer: Self-pay | Admitting: Nurse Practitioner

## 2024-06-06 VITALS — BP 118/78 | HR 83 | Temp 97.4°F | Ht 67.0 in | Wt 263.0 lb

## 2024-06-06 DIAGNOSIS — J0100 Acute maxillary sinusitis, unspecified: Secondary | ICD-10-CM

## 2024-06-06 MED ORDER — GUAIFENESIN-CODEINE 100-10 MG/5ML PO SOLN: 5.0000 mL | Freq: Three times a day (TID) | ORAL | 0 refills | Status: AC | PRN

## 2024-06-06 MED ORDER — AMOXICILLIN-POT CLAVULANATE 875-125 MG PO TABS: 1.0000 | ORAL_TABLET | Freq: Two times a day (BID) | ORAL | 0 refills | Status: AC

## 2024-06-06 NOTE — Telephone Encounter (Signed)
 FYI Only or Action Required?: FYI only for provider: appointment scheduled on 06/06/24.  Patient was last seen in primary care on 05/09/2024 by [name], DO.  Called Nurse Triage reporting Nasal Congestion.  Symptoms began several days ago.  Interventions attempted: OTC medications: tylenol , ibuprofen , cough medicine.  Symptoms are: gradually worsening.  Triage Disposition: See HCP Within 4 Hours (Or PCP Triage)  Patient/caregiver understands and will follow disposition?: Yes  Reason for Disposition  [1] SEVERE sinus pain (e.g., excruciating) AND [2] not improved 2 hours after pain medicine  Answer Assessment - Initial Assessment Questions Pressure behind nose, headache, green/yellow mucus, coughing,  Onset 2 days go  Thinks she has a sinus infection  1. LOCATION: Where does it hurt?      Sinus area 2. ONSET: When did the sinus pain start?  (e.g., hours, days)      2 days ago 3. SEVERITY: How bad is the pain?   (Scale 0-10; or none, mild, moderate or severe)     5/10 4. RECURRENT SYMPTOM: Have you ever had sinus problems before? If Yes, ask: When was the last time? and What happened that time?      yes 5. NASAL CONGESTION: Is the nose blocked? If Yes, ask: Can you open it or must you breathe through your mouth?     yes 6. NASAL DISCHARGE: Do you have discharge from your nose? If so ask, What color?     Yellow/green 7. FEVER: Do you have a fever? If Yes, ask: What is it, how was it measured, and when did it start?      Denies  8. OTHER SYMPTOMS: Do you have any other symptoms? (e.g., sore throat, cough, earache, difficulty breathing)     cough  Protocols used: Sinus Pain or Congestion-A-AH  Copied from CRM #[id number]. Topic: Clinical - Red Word Triage >> Jun 06, 2024  8:02 AM Avram MATSU wrote: Red Word that prompted transfer to Nurse Triage: head congestion, pain/pressure in face. Green  mucus/coughing  ----------------------------------------------------------------------- From previous Reason for Contact - Scheduling: Patient/patient representative is calling to schedule an appointment. Refer to attachments for appointment information.

## 2024-06-06 NOTE — Progress Notes (Signed)
 "  Subjective:    Patient ID: Cheryl Randall, female    DOB: January 24, 1976, 49 y.o.   MRN: [id number]   Chief Complaint: Sinus Problem (Ears itching/)   Sinus Problem This is a new problem. The current episode started in the past 7 days. The problem has been gradually worsening since onset. There has been no fever. Her pain is at a severity of 7/10. The pain is moderate. Associated symptoms include congestion, coughing and sinus pressure. Pertinent negatives include no chills or sore throat. Past treatments include oral decongestants and acetaminophen  (guafenacin with codiene). The treatment provided mild relief.    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  HENT:  Positive for congestion and sinus pressure. Negative for sore throat.   Respiratory:  Positive for cough.        Objective:   Physical Exam Constitutional:      Appearance: Normal appearance. She is obese.  Cardiovascular:     Rate and Rhythm: Normal rate and regular rhythm.     Heart sounds: Normal heart sounds.  Pulmonary:     Effort: Pulmonary effort is normal.     Breath sounds: Normal breath sounds.  Skin:    General: Skin is warm.  Neurological:     General: No focal deficit present.      Mental Status: She is alert and oriented to person, place, and time.  Psychiatric:        Mood and Affect: Mood normal.        Behavior: Behavior normal.    BP 118/78   Pulse 83   Temp (!) 97.4 F (36.3 C) (Temporal)   Ht 5' 7 (1.702 m)   Wt 263 lb (119.3 kg)   LMP 02/24/2013   SpO2 97%   BMI 41.19 kg/m         Assessment & Plan:   Cheryl Randall in today with chief complaint of Sinus Problem (Ears itching/)   1. Acute non-recurrent maxillary sinusitis (Primary) 1. Take meds as prescribed 2. Use a cool mist humidifier especially during the winter months and when heat has been humid. 3. Use saline nose sprays frequently 4. Saline irrigations of the nose can be very helpful if done frequently.  * 4X daily for 1 week*  * Use of a nettie pot can be helpful with this. Follow directions with this* 5. Drink plenty of fluids 6. Keep thermostat turn down low 7.For any cough or congestion- guaifenesin  with codiene 8. For fever or aces or pains- take tylenol  or ibuprofen  appropriate for age and weight.  * for fevers greater than 101 orally you may alternate ibuprofen  and tylenol  every  3 hours.    - amoxicillin -clavulanate (AUGMENTIN ) 875-125 MG tablet; Take 1 tablet by mouth 2 (two) times daily.  Dispense: 14 tablet;  Refill: 0 - guaiFENesin -codeine  100-10 MG/5ML syrup; Take 5 mLs by mouth 3 (three) times daily as needed for cough.  Dispense: 120 mL; Refill: 0    The above assessment and management plan was discussed with the patient. The patient verbalized understanding of and has agreed to the management plan. Patient is aware to call the clinic if symptoms persist or worsen. Patient is aware when to return to the clinic for a follow-up visit. Patient educated on when it is appropriate to go to the emergency department.   Mary-Margaret Gladis, FNP   "

## 2024-06-06 NOTE — Patient Instructions (Signed)
 1. Take meds as prescribed 2. Use a cool mist humidifier especially during the winter months and when heat has been humid. 3. Use saline nose sprays frequently 4. Saline irrigations of the nose can be very helpful if done frequently.  * 4X daily for 1 week*  * Use of a nettie pot can be helpful with this. Follow directions with this* 5. Drink plenty of fluids 6. Keep thermostat turn down low 7.For any cough or congestion- guaifenesin  with codiene 8. For fever or aces or pains- take tylenol  or ibuprofen  appropriate for age and weight.  * for fevers greater than 101 orally you may alternate ibuprofen  and tylenol  every  3 hours.

## 2024-06-09 MED ORDER — FLUCONAZOLE 150 MG PO TABS: 150.0000 mg | ORAL_TABLET | Freq: Once | ORAL | 0 refills | Status: AC

## 2024-06-09 NOTE — Telephone Encounter (Signed)
 Diflucan  prescription sent to pharmacy  Meds ordered this encounter  Medications   fluconazole  (DIFLUCAN ) 150 MG tablet    Sig: Take 1 tablet (150 mg total) by mouth once for 1 dose.    Dispense:  1 tablet    Refill:  0    Supervising Provider:   MARYANNE CHEW A [[id number]]   Natali-Margaret Gladis, FNP

## 2024-06-17 ENCOUNTER — Telehealth: Payer: Self-pay | Admitting: Family Medicine

## 2024-06-17 DIAGNOSIS — E063 Autoimmune thyroiditis: Secondary | ICD-10-CM

## 2024-06-17 DIAGNOSIS — E538 Deficiency of other specified B group vitamins: Secondary | ICD-10-CM

## 2024-06-17 DIAGNOSIS — E611 Iron deficiency: Secondary | ICD-10-CM

## 2024-06-17 NOTE — Telephone Encounter (Signed)
 done

## 2024-06-17 NOTE — Telephone Encounter (Signed)
 Patient has appt 1-20 for labs and needs orders put in. Dr. KANDICE patient.

## 2024-06-21 ENCOUNTER — Other Ambulatory Visit

## 2024-06-24 ENCOUNTER — Ambulatory Visit (INDEPENDENT_AMBULATORY_CARE_PROVIDER_SITE_OTHER)

## 2024-06-24 DIAGNOSIS — E538 Deficiency of other specified B group vitamins: Secondary | ICD-10-CM

## 2024-06-24 MED ORDER — CYANOCOBALAMIN 1000 MCG/ML IJ SOLN
1000.0000 ug | Freq: Once | INTRAMUSCULAR | Status: AC
  Administered 2024-06-24: 1000 ug via INTRAMUSCULAR

## 2024-06-24 NOTE — Progress Notes (Signed)
 Patient presented to office today for B-12 injection. Injection given in right arm per patients request. Advised patient that in one month she needs to return to have labs drawn to recheck her B-12 levels. Advised that future orders had already been placed. Patient verbalized understanding

## 2024-07-07 NOTE — Telephone Encounter (Signed)
 SABRA

## 2024-07-22 ENCOUNTER — Ambulatory Visit: Admitting: Neurology

## 2024-09-13 ENCOUNTER — Ambulatory Visit: Admitting: Family Medicine

## 2024-12-06 ENCOUNTER — Ambulatory Visit: Payer: Self-pay | Admitting: Neurology
